# Patient Record
Sex: Female | Born: 1965 | Race: White | Hispanic: No | Marital: Married | Smoking: Never smoker
Health system: Southern US, Community
[De-identification: ages and names within clinical notes are randomized; demographics above are authoritative.]

## PROBLEM LIST (undated history)

## (undated) DIAGNOSIS — A419 Sepsis, unspecified organism: Secondary | ICD-10-CM

## (undated) DIAGNOSIS — I456 Pre-excitation syndrome: Secondary | ICD-10-CM

## (undated) DIAGNOSIS — I4891 Unspecified atrial fibrillation: Secondary | ICD-10-CM

## (undated) HISTORY — PX: KNEE SURGERY: SHX244

## (undated) HISTORY — PX: ABDOMINAL HYSTERECTOMY: SHX81

---

## 2000-11-02 ENCOUNTER — Emergency Department (HOSPITAL_COMMUNITY): Admission: EM | Admit: 2000-11-02 | Discharge: 2000-11-02 | Payer: Self-pay | Admitting: Emergency Medicine

## 2000-12-02 ENCOUNTER — Emergency Department (HOSPITAL_COMMUNITY): Admission: EM | Admit: 2000-12-02 | Discharge: 2000-12-02 | Payer: Self-pay | Admitting: Emergency Medicine

## 2000-12-02 ENCOUNTER — Encounter: Payer: Self-pay | Admitting: *Deleted

## 2000-12-02 ENCOUNTER — Encounter: Payer: Self-pay | Admitting: Emergency Medicine

## 2003-02-21 ENCOUNTER — Encounter: Payer: Self-pay | Admitting: Emergency Medicine

## 2003-02-21 ENCOUNTER — Emergency Department (HOSPITAL_COMMUNITY): Admission: EM | Admit: 2003-02-21 | Discharge: 2003-02-21 | Payer: Self-pay | Admitting: Emergency Medicine

## 2006-04-23 ENCOUNTER — Other Ambulatory Visit: Admission: RE | Admit: 2006-04-23 | Discharge: 2006-04-23 | Payer: Self-pay | Admitting: Obstetrics and Gynecology

## 2006-09-08 ENCOUNTER — Ambulatory Visit (HOSPITAL_COMMUNITY): Admission: RE | Admit: 2006-09-08 | Discharge: 2006-09-09 | Payer: Self-pay | Admitting: Obstetrics and Gynecology

## 2006-09-08 ENCOUNTER — Encounter (INDEPENDENT_AMBULATORY_CARE_PROVIDER_SITE_OTHER): Payer: Self-pay | Admitting: Specialist

## 2006-09-17 ENCOUNTER — Inpatient Hospital Stay (HOSPITAL_COMMUNITY): Admission: AD | Admit: 2006-09-17 | Discharge: 2006-09-26 | Payer: Self-pay | Admitting: Obstetrics and Gynecology

## 2006-09-20 ENCOUNTER — Encounter (INDEPENDENT_AMBULATORY_CARE_PROVIDER_SITE_OTHER): Payer: Self-pay | Admitting: *Deleted

## 2006-09-22 ENCOUNTER — Encounter: Payer: Self-pay | Admitting: Vascular Surgery

## 2006-09-22 ENCOUNTER — Ambulatory Visit: Payer: Self-pay | Admitting: Vascular Surgery

## 2006-09-22 ENCOUNTER — Encounter: Payer: Self-pay | Admitting: Pulmonary Disease

## 2006-09-23 ENCOUNTER — Ambulatory Visit: Payer: Self-pay | Admitting: Internal Medicine

## 2007-01-12 ENCOUNTER — Emergency Department (HOSPITAL_COMMUNITY): Admission: EM | Admit: 2007-01-12 | Discharge: 2007-01-12 | Payer: Self-pay | Admitting: Emergency Medicine

## 2010-06-27 ENCOUNTER — Emergency Department (HOSPITAL_BASED_OUTPATIENT_CLINIC_OR_DEPARTMENT_OTHER)
Admission: EM | Admit: 2010-06-27 | Discharge: 2010-06-27 | Payer: Self-pay | Source: Home / Self Care | Admitting: Emergency Medicine

## 2010-08-20 ENCOUNTER — Emergency Department (HOSPITAL_COMMUNITY)
Admission: EM | Admit: 2010-08-20 | Discharge: 2010-08-20 | Disposition: A | Discharge status: Home / Self Care | Payer: Self-pay | Attending: Emergency Medicine | Admitting: Emergency Medicine

## 2010-08-20 DIAGNOSIS — M7989 Other specified soft tissue disorders: Secondary | ICD-10-CM | POA: Insufficient documentation

## 2010-08-20 DIAGNOSIS — M79609 Pain in unspecified limb: Secondary | ICD-10-CM | POA: Insufficient documentation

## 2010-08-20 LAB — POCT I-STAT, CHEM 8
Calcium, Ion: 1.17 mmol/L (ref 1.12–1.32)
Creatinine, Ser: 0.7 mg/dL (ref 0.4–1.2)
Potassium: 3.9 mEq/L (ref 3.5–5.1)

## 2010-08-20 LAB — DIFFERENTIAL
Basophils Relative: 1 % (ref 0–1)
Eosinophils Absolute: 0.3 10*3/uL (ref 0.0–0.7)
Monocytes Absolute: 0.6 10*3/uL (ref 0.1–1.0)
Monocytes Relative: 7 % (ref 3–12)

## 2010-08-20 LAB — D-DIMER, QUANTITATIVE: D-Dimer, Quant: 0.46 ug/mL-FEU (ref 0.00–0.48)

## 2010-08-20 LAB — CBC
Hemoglobin: 13.5 g/dL (ref 12.0–15.0)
MCV: 86.2 fL (ref 78.0–100.0)
RBC: 4.56 MIL/uL (ref 3.87–5.11)
RDW: 13.5 % (ref 11.5–15.5)

## 2010-08-21 ENCOUNTER — Ambulatory Visit (HOSPITAL_COMMUNITY): Payer: Self-pay

## 2010-09-08 ENCOUNTER — Emergency Department (HOSPITAL_COMMUNITY)
Admission: EM | Admit: 2010-09-08 | Discharge: 2010-09-08 | Disposition: A | Discharge status: Home / Self Care | Payer: Self-pay | Attending: Emergency Medicine | Admitting: Emergency Medicine

## 2010-09-08 ENCOUNTER — Emergency Department (HOSPITAL_COMMUNITY): Payer: Self-pay

## 2010-09-08 DIAGNOSIS — M25569 Pain in unspecified knee: Secondary | ICD-10-CM | POA: Insufficient documentation

## 2010-09-10 ENCOUNTER — Other Ambulatory Visit (HOSPITAL_COMMUNITY): Payer: Self-pay | Admitting: Sports Medicine

## 2010-09-10 DIAGNOSIS — S83289A Other tear of lateral meniscus, current injury, unspecified knee, initial encounter: Secondary | ICD-10-CM

## 2010-09-14 ENCOUNTER — Ambulatory Visit (HOSPITAL_COMMUNITY)
Admission: RE | Admit: 2010-09-14 | Discharge: 2010-09-14 | Disposition: A | Discharge status: Home / Self Care | Payer: Self-pay | Source: Ambulatory Visit | Attending: Sports Medicine | Admitting: Sports Medicine

## 2010-09-14 DIAGNOSIS — IMO0002 Reserved for concepts with insufficient information to code with codable children: Secondary | ICD-10-CM | POA: Insufficient documentation

## 2010-09-14 DIAGNOSIS — S83289A Other tear of lateral meniscus, current injury, unspecified knee, initial encounter: Secondary | ICD-10-CM

## 2010-09-14 DIAGNOSIS — M7120 Synovial cyst of popliteal space [Baker], unspecified knee: Secondary | ICD-10-CM | POA: Insufficient documentation

## 2010-09-14 DIAGNOSIS — W19XXXA Unspecified fall, initial encounter: Secondary | ICD-10-CM | POA: Insufficient documentation

## 2010-09-14 DIAGNOSIS — M2240 Chondromalacia patellae, unspecified knee: Secondary | ICD-10-CM | POA: Insufficient documentation

## 2010-09-28 ENCOUNTER — Ambulatory Visit (HOSPITAL_BASED_OUTPATIENT_CLINIC_OR_DEPARTMENT_OTHER)
Admission: RE | Admit: 2010-09-28 | Discharge: 2010-09-28 | Disposition: A | Discharge status: Home / Self Care | Payer: Self-pay | Source: Ambulatory Visit | Attending: Orthopedic Surgery | Admitting: Orthopedic Surgery

## 2010-09-28 DIAGNOSIS — E669 Obesity, unspecified: Secondary | ICD-10-CM | POA: Insufficient documentation

## 2010-09-28 DIAGNOSIS — Z01812 Encounter for preprocedural laboratory examination: Secondary | ICD-10-CM | POA: Insufficient documentation

## 2010-09-28 DIAGNOSIS — M23329 Other meniscus derangements, posterior horn of medial meniscus, unspecified knee: Secondary | ICD-10-CM | POA: Insufficient documentation

## 2010-09-28 LAB — POCT HEMOGLOBIN-HEMACUE: Hemoglobin: 13.8 g/dL (ref 12.0–15.0)

## 2010-10-04 NOTE — Op Note (Signed)
  NAMENERIA, PROCTER          ACCOUNT NO.:  [id number]  MEDICAL RECORD NO.:  [id number]          PATIENT TYPE:  LOCATION:                                 FACILITY:  PHYSICIAN:  Eulas Post, MD    DATE OF BIRTH:  Dec 20, 1965  DATE OF PROCEDURE:  09/28/2010 DATE OF DISCHARGE:                              OPERATIVE REPORT   PREOPERATIVE DIAGNOSIS:  Left medial meniscus tear.  POSTOPERATIVE DIAGNOSIS:  Left medial meniscus tear.  OPERATIVE PROCEDURE:  Left knee arthroscopy with partial medial meniscectomy.  ATTENDING SURGEON:  Eulas Post, MD  ASSISTANT:  Janace Litten, OPA-C  PREOPERATIVE INDICATIONS:  Ms. Chrisma Hurlock is a 45 year old woman who complained of severe left knee pain.  She failed conservative measures and elected for knee arthroscopy.  She had a posterior horn medial meniscus tear found on MRI.  The risks, benefits, and alternatives were discussed with her before the procedure including but not limited to risks of infection, bleeding, nerve injury, progression of arthritis, recurrent meniscal tear, incomplete relief of pain, cardiopulmonary complications, among others, and she is willing to proceed.  OPERATIVE FINDINGS:  The patellofemoral joint was relatively normal, although there was some cracking in the undersurface of the patella, but overall grade 2 changes at most.  The lateral compartment was completely normal.  The ACL and PCL were normal.  The medial compartment had some fairly diffuse grade 2 changes on the femoral condyle with grade 1 changes on the tibia.  The posterior horn medial meniscus had a radial tear that extended almost completely to the capsular junction.  The body and anterior horn were intact.  OPERATIVE PROCEDURE:  The patient was brought to the operating room, placed in supine position.  General anesthesia administered.  The left lower extremity was prepped and draped in the usual sterile fashion. Diagnostic  arthroscopy was carried out with the above-named findings.  I used an arthroscopic shaver and an arthroscopic biter to debride the posterior horn radial tear back to a stable configuration.  This went almost all the way back to the capsule.  This had been debrided, both at the root as well as at the posterior portion of the body.  After this was debrided, the arthroscopic instruments were removed and all loose debris removed, and the portals repaired with Monocryl and Steri-Strips and sterile gauze.  The patient was awakened and extubated and returned to the PACU in stable and satisfactory condition.  There were no complications, and she tolerated the procedure well.     Eulas Post, MD     JPL/MEDQ  D:  09/28/2010  T:  09/29/2010  Job:  [id number]  Electronically Signed by Teryl Lucy MD on 10/04/2010 11:28:44 AM

## 2010-11-16 NOTE — Op Note (Signed)
[name], [name]          ACCOUNT NO.:  [id number]   MEDICAL RECORD NO.:  [id number]          PATIENT TYPE:  INP   LOCATION:  9303                          FACILITY:  [facility]   PHYSICIAN:  Hal Morales, M.D.DATE OF BIRTH:  1966-03-10   DATE OF PROCEDURE:  09/20/2006  DATE OF DISCHARGE:                               OPERATIVE REPORT   PREOPERATIVE DIAGNOSES:  Pelvic abscess, probably at least two abscesses  with loculation.   POSTOPERATIVE DIAGNOSIS:  Pelvic abscess.  Right adnexal abscess.  Numerous adhesions between loops of bowel and between bowel and pelvic  sidewall and between bowel and abscess.   OPERATION:  Diagnostic laparoscopy, laparotomy, intraoperative  consultation for lysis of adhesions, drainage of pelvic abscess, and  appendectomy.   SURGEON:  Hal Morales, M.D. for laparoscopy, for initiation of  laparotomy.   FIRST ASSISTANT:  Janine Limbo, M.D.   Intraoperative consultant and surgeon for completion of the laparotomy,  appendectomy, and lysis of adhesions with drainage of pelvic abscess was  [facility]. Derrell Lolling, M.D.   ANESTHESIA:  General orotracheal.   ESTIMATED BLOOD LOSS FOR TOTAL PROCEDURE:  \  150 mL.   COMPLICATIONS:  No immediate intraoperative complications noted.   FINDINGS:  There were numerous bowel adhesions with small bowel  dilatation.  At the time of laparoscopy, it was noted that the bowel was  adherent in loops and obscured the pelvic abscess which had been noted  on CT scan.  At the time of laparotomy, the bowel adhesions were noted  to be significant enough that even with the ability to digitally move  the bowel, there was significant risk of bowel damage.  It was at that  time that the decision was made to obtain intraoperative consultation  from general surgery.   PROCEDURE:  The patient was taken to the operating room after  appropriate identification and after a discussion of the indications for  her  procedure, which included a pelvic abscess which was not responding  clinically to antibiotics, and the risks involved of anesthesia,  bleeding, infection, damage to adjacent organs.  A similar conversation  had been held with the patient prior to her hysterectomy approximately  12 days ago, and she acknowledged recollection of that.  She was placed  on the operating table, and after attainment of adequate general  anesthesia, she was placed in the modified lithotomy position.  The  abdomen, perineum, and vagina were prepped with multiple layers of  Betadine.  A Foley catheter was inserted into the bladder and connected  to straight drainage.  A sponge stick with two sponges was placed in the  vagina for elevation of the vaginal cuff.  The abdomen was draped as a  sterile field.  Subumbilical and suprapubic infiltration of the skin was undertaken for  a total of 10 mL of 0.25% Marcaine.   A subumbilical incision was made and the abdomen opened through that 3-  cm incision to allow identification of the fascia.  The fascia was  opened sharply and the peritoneum identified.  The peritoneum was opened  sharply and sutures used to mark the  peritoneum and fascia and the  sutures intended to be used for postoperative closure.  The Hassan  cannula was inserted into the peritoneal cavity and a pneumoperitoneum  created with approximately 5 liters of CO2.  Suprapubic incisions were  made to the right and left of midline and laparoscopic probe trocars  placed through the incision under direct visualization after the  laparoscope had been placed through the subumbilical trocar sleeve.  The  above-noted findings were made and a decision made that drainage  laparoscopically was not going to be in the patient's best interest.  At  that time, all instruments were removed from the peritoneal cavity under  direct visualization as the CO2 was allowed to escape.  Prior to  removal, copious irrigation had  been carried out in an effort to better  visualize the area of the pelvic abscess but to no avail.  The  subumbilical incision was closed with the 0 Vicryl sutures that had been  used to mark the fascia in an interrupted fashion.  A subcuticular  suture of 3-0 Vicryl was then used to close the skin incision.   A transverse incision was made in the abdomen in hopes of being able to  readily access the pelvic abscess and drain it through a small incision.  The abdomen was opened in layers and the peritoneum entered.  The bowel  was again noted to be markedly adherent,  loops one to another, all of  small bowel.  No defect in the integrity of the bowel was noted, but the  ability to mobilize the bowel for access to the pelvic abscess was  markedly limited by further adhesions.  At this time, it was decided to  obtain intraoperative consultation, and Dr. Claud Kelp was called  for intraoperative consultation.  He reviewed the abdominal pelvic CT  scan which had been done earlier in the day and presented for  intraoperative consultation concerning maintaining integrity of the  bowel and accomplishing drainage of the pelvic and adnexal abscesses.  At this time, the primary surgeon became Dr. Derrell Lolling, and I continued  with first assisting in the completion of the procedure which was  intraoperative consultation, drainage of pelvic abscess, and  appendectomy.  The remainder of that procedure is dictated under a  separate operative report.      Hal Morales, M.D.  Electronically Signed     VPH/MEDQ  D:  09/20/2006  T:  09/21/2006  Job:  [id number]

## 2010-11-16 NOTE — Discharge Summary (Signed)
NAMEAMALIA, [name]          ACCOUNT NO.:  [id number]   MEDICAL RECORD NO.:  [id number]          PATIENT TYPE:  OIB   LOCATION:  9319                          FACILITY:  [facility]   PHYSICIAN:  Hal Morales, M.D.DATE OF BIRTH:  Aug 13, 1965   DATE OF ADMISSION:  09/08/2006  DATE OF DISCHARGE:  09/09/2006                               DISCHARGE SUMMARY   DISCHARGE DIAGNOSES:  1. Fibroid uterus.  2. Menorrhagia.   OPERATION:  On the day of admission, the patient underwent a total  vaginal hysterectomy, tolerating procedure well.  The patient was found  to have a uterus that was enlarged to approximately 10 weeks' size and  weighed 247 grams.  The ovaries were within normal limits, and the tubes  were status post tubal ligation for surgical sterilization.   HISTORY OF PRESENT ILLNESS:  [name] is a 45 year old, divorced,  white female para 2-0-0-1, who presents for definitive therapy because  of symptomatic uterine fibroids and menorrhagia.  Please see the  patient's dictated history and physical examination for details.   PREOPERATIVE PHYSICAL EXAMINATION:  VITAL SIGNS:  Blood pressure 118/80,  temperature 98 degrees.  GENERAL:  Within normal limits.  PELVIC:  EG/BUS within normal limits.  Vagina is rugous.  The cervix had  several Nabothian cysts.  The uterus is upper limits of normal size,  posterior, with slightly decreased mobility.  The adnexa showed no  palpable masses.  Rectovaginal exam confirms.   HOSPITAL COURSE:  On the day of admission, the patient underwent  aforementioned procedure, tolerating it well.  Postoperative course was  unremarkable with the patient tolerating a post-op hemoglobin of 10.9  (preoperative hemoglobin 12.9).  By post-op day #1, the patient had  resumed bowel and bladder function and was therefore deemed ready for  discharge home.   DISCHARGE MEDICATIONS:  1. Ibuprofen 600 mg every 6 hours for 3 days, then as needed for pain.  2.  Percocet one or two tablets every 4 hours as needed for pain.  3. Colace 100 mg up to twice daily as needed for constipation.   FOLLOWUP:  The patient is scheduled for a 6 weeks postoperative visit  with Dr. Pennie Rushing.  She is to call [phone] to schedule that appointment.   DISCHARGE INSTRUCTIONS:  The patient was given a copy of [facility] postoperative instruction sheet.  She was further  advised to avoid driving for 2 weeks, heavy lifting for 6 weeks,  intercourse for 6 weeks, that she may shower and bathe, may walk up  steps, should increase her activities slowly, and her diet is without  restriction.   FINAL PATHOLOGY:  Uterus, hysterectomy:  Uterine cervix - benign  ectocervical and endocervical mucosa with chronic endocervicitis and  squamous metaplasia.  Uterine corpus - benign proliferative endometrium  with intramural leiomyomata (three in number).      [name].      Hal Morales, M.D.  Electronically Signed    EJP/MEDQ  D:  10/04/2006  T:  10/04/2006  Job:  [id number]

## 2010-11-16 NOTE — Op Note (Signed)
[name], [name]          ACCOUNT NO.:  [id number]   MEDICAL RECORD NO.:  [id number]          PATIENT TYPE:  INP   LOCATION:  1228                         FACILITY:  [facility]   PHYSICIAN:  Angelia Mould. Derrell Lolling, M.D.DATE OF BIRTH:  07-Apr-1966   DATE OF PROCEDURE:  09/20/2006  DATE OF DISCHARGE:                               OPERATIVE REPORT   REASON FOR CONSULTATION:  Intraoperative consultation and management of  pelvic abscess.   PREOPERATIVE DIAGNOSIS:  Pelvic abscess and right lower quadrant  abscess.   POSTOPERATIVE DIAGNOSIS:  Pelvic abscess and right lower quadrant  abscess with involvement of appendix.   PROCEDURE:  1. Intraoperative consultation.  2. Drainage of pelvic abscess.  3. Appendectomy.   SURGEON:  Angelia Mould. Derrell Lolling, M.D.   FIRST ASSISTANT:  Hal Morales, M.D.   HISTORY:  This is a 45 year old single white female who underwent total  vaginal hysterectomy on September 08, 2006 with uneventful findings.  She  did well for a week but came back with abdominal pain, nausea, and  vomiting and was readmitted to the hospital by Dr. Pennie Rushing on September 17, 2006.  She has been constipated.  Denied vaginal bleeding.  Had low-  grade fever.   PAST MEDICAL HISTORY:  1. Gastroenteritis two months ago.  2. History of migraines.  3. One episode of bronchospasm.   PAST SURGICAL HISTORY:  1. Tubal ligation in 1988.  2. Umbilical hernia repair.   ALLERGIES:  PENICILLIN AND CEPHALOSPORINS.   PHYSICAL EXAMINATION:  I was unable to examine the patient  preoperatively as I was consulted intraoperatively.   OPERATIVE FINDINGS:  The patient had an abscess in the right lower  quadrant involving the right adnexa.  This was thick, yellow, creamy pus  which was cultured.  There was a larger 8-cm deep pelvic abscess in the  midline which was very turbid and infected but was more watery in  nature.  There was no evidence of any bowel obstruction.  There was no  evidence of  any GI perforation or content.  The appendix appeared  severely secondarily inflamed with a right adnexal abscess.  For that  reason, I felt that it was at risk and should be removed.  I could  identify the terminal ileum and the cecum, and they looked normal.  The  omentum and transverse colon looked normal.  There was some cloudy  watery fluid in the upper abdomen from the irrigation fluid but no  abscess in the upper abdomen and no inflammatory process in the upper  abdomen on either side.   OPERATIVE TECHNIQUE:  By Dr. Dierdre Forth had performed diagnostic  laparoscopy and had performed a Pfannenstiel incision and then called me  to the operating room when it became apparent that the inflammatory  reaction was rather dense, and she was concerned for injury to the  bowel.  I reviewed the CT scan which showed an 8-cm unilocular abscess  deep within the pelvis in the cul-de-sac anterior to the rectum and also  what appeared to be a smaller bilobed abscess in the right adnexal  region.  By CT scan,  there was no disease process in the upper abdomen  or subphrenic spaces.   I scrubbed in.  I felt that the Pfannenstiel incision was inadequate for  exposure.  I made a midline incision from just below the umbilicus down  into the center of the Pfannenstiel incision.  I divided the  subcutaneous tissue and the midline fascia and then divided the rest of  the peritoneum.  At that point we could place a self-retaining  retractor.   I explored the entire abdomen and pelvis.  I entered an abscess in the  right lower quadrant below the cecum, and there was tan creamy pus, and  that was cultured.  I mobilized the appendix and inspected it and felt  that we would have to take that out.  I then slowly dissected some of  the small bowel loops away from one another.  They were densely adherent  to the bladder, but I found a space between bowel loops and I got down  into the true pelvis and drained  the abscess.  I irrigated this out  quite extensively.  I washed the fluid out and felt that this had been  completely adequately drain.  I felt that further dissection of the  small bowel and colon would put them at risk for perforation, and so I  decided just to drain the areas.   I divided the mesentery of the appendix between clamps and ligated it  with 2-0 silk ties.  I placed a pursestring suture of 2-0 silk in the  cecum around the base of the appendix.  I put a 2-0 Vicryl tie around  the base of the appendix and then amputated the appendix and removed it.  I inverted the appendix and then tied the pursestring.  This provided a  very secure closure, and no further sutures were necessary.   We spent some time irrigating the pelvis, the abdomen, and the  subphrenic spaces, getting all of the fluid until it was completely  clear and irrigated out.  I placed a 10-French Jackson-Pratt drain down  into the pelvic abscess and then a second 10-French drain into the right  lower quadrant/right adnexal region.  These were brought out through  separate stab incisions in the right lower quadrant and sutured to the  skin with nylon sutures and connected to suction bulbs.  We returned the  omentum to its anatomic position.  The peritoneum was closed with a  running suture of 2-0 Vicryl.  I reconstructed all of the abdominal wall  fascia, first bringing the anterior rectus sheath together in the  midline inferiorly.  I then closed the Pfannenstiel incision by closing  the anterior rectus fascia and the internal and external oblique with  running sutures of #1 PDS.  Separate sutures were placed on the right  and on the left.  This completely reconstructed the Pfannenstiel  incision.  The midline fascia was then closed with a running suture of  #1 PDS and about four or five interrupted sutures of #1 Novofil.  The skin was closed with skin staples, leaving several gaps through which I  placed  Telfa wicks.  Clean bandages were placed and the patient taken to  the recovery room in stable condition.   ESTIMATED BLOOD LOSS:  Probably around 250 mL.   COMPLICATIONS:  None.   COUNTS:  Sponge, needle, and instrument counts were correct.      Angelia Mould. Derrell Lolling, M.D.  Electronically Signed  HMI/MEDQ  D:  09/20/2006  T:  09/21/2006  Job:  [id number]   cc:   Hal Morales, M.D.  Fax: [phone]

## 2010-11-16 NOTE — Discharge Summary (Signed)
[name], [name]          ACCOUNT NO.:  [id number]   MEDICAL RECORD NO.:  [id number]          PATIENT TYPE:  INP   LOCATION:  1416                         [facility]ACILITY[facility]   PHYSICIAN:  Hal Morales, M.D.DATE OF BIRTH:  [birth year]   DATE OF ADMISSION:  09/17/2006  DATE OF DISCHARGE:  09/26/2006                               DISCHARGE SUMMARY   DISCHARGE DIAGNOSES:  1. Abdominal pain.  2. Pelvic abscess.  3. Atrial fibrillation.  4. Status post total vaginal hysterectomy (September 08, 2006).   PROCEDURE:  On hospital day #3, the patient underwent a diagnostic  laparoscopy followed by a laparotomy with intraoperative consultation  from Dr. Derrell Lolling of general surgery for lysis of adhesions, drainage of  pelvic abscesses and an appendectomy.  The patient tolerated the  procedure well.  The patient was found to have an abscess in the right  lower quadrant involving her right adnexa.  Additionally, there was a  larger, 8 cm deep pelvic abscess in the midline of her pelvis.  There  was no evidence of any bowel obstruction nor was there any evidence of  GI perforation or content.  The patient's appendix appeared severely  secondarily inflamed with the right adnexal abscess.   HISTORY OF PRESENT ILLNESS:  [name] is a 45 year old, single, white  female who is status post total vaginal hysterectomy (September 08, 2006)  and presented to [facility] [place] complaining of severe  abdominal pain, nausea and vomiting.  The patient was admitted for pain  management and further evaluation of her symptoms.  Please see the  patient's dictated history and physical examination for details.   PHYSICAL EXAMINATION:  VITAL SIGNS:  Temperature 99.6 degrees Fahrenheit  orally, however, during her exam it increased to 102.5 degrees  Fahrenheit orally.  Blood pressure 125/77, pulse 107, respirations 20.  GENERAL:  Within normal limits.  ABDOMEN:  Tender to palpation.  There was no  rebound or guarding.  PELVIC:  Deferred.   LABORATORY DATA AND X-RAY FINDINGS:  Admission CBC with differential  revealed a white count of 15.6, hemoglobin 12.5, platelets 23,000,  neutrophils 89, granulocytes 13.9, lymphocytes 5.  The patient's  comprehensive metabolic panel was within normal limits other than the  fact of the patient's potassium being 2.8.   CT scan of the abdomen and pelvis showed no small bowel obstruction.  There was, however, a fluid collection in the cul-de-sac which was felt  to be postoperative fluid or hematoma, although an abscess could not be  excluded.   HOSPITAL COURSE:  On the date of admission, the patient was placed on  appropriate antibiotic therapy and managed for pain, nausea and  vomiting.  The patient gradually improved clinically, however, her  temperature remained elevated and over time her abdominal pain returned.  A repeat CTscan showed a definite pelvic abscess. Consequently, the  patient underwent aforementioned surgical procedures.  By postop day #1,  the patient reported a decrease in her abdominal pain.  At that time,  her white count was 11.8 with neutrophils being 87%, platelets 203,  hemoglobin 9.3, hematocrit 27.8 and her potassium had increased  to 3.1.  On the early morning of postop day #2, the patient went into atrial  fibrillation which was converted to sinus rhythm with amiodarone.  The  patient's cardiac enzymes were negative and her evaluation for  thromboembolic events were negative for DVT and pulmonary embolism. I  was thus felt that with normalization of her potassium, the patient  should require no further antiarhythmia therapy. The cultures from  patient's pelvic abscesses were negative at this time with a Gram stain  showing gram-positive cocci in pairs.  The patient's HIV test was  nonreactive.  Due to the patient's persistent fever, it was decided to  change her antibiotics to Tygacil and Azactam for full antibiotic   coverage.  An infectious disease consult was also obtained and they  concurred with the new choice in antibiotic therapy.  From this point,  the patient began to improve once again and by postop day #4 resumed  bowel function, had normalized her temperature, her potassium increased  to 3.6, white blood count was 9.2, hemoglobin 8.9, hematocrit 25.8,  platelets 325.  This trend continued until the patient's discharge on  hospital day #9, at which time it was believed that she had received the  maximum benefit of her hospital stay.   DISCHARGE LABORATORY DATA AND X-RAY FINDINGS:  Discharge CBC with WBC  11.7, hemoglobin 10.2, hematocrit 29.6, platelets 518.  Basic metabolic  panel with sodium 137, potassium 3.5, chloride 102, CO2 28, glucose 115,  BUN 3, creatinine 0.48.   DISCHARGE MEDICATIONS:  1. Avelox 400 mg daily through October 11, 2006.  2. Flagyl 500 mg four times a day through April 17.  3. Vicodin one to two tablets every 4-6 hours as needed for pain.  4. Iron sulfate 300 mg daily.   FOLLOW UP:  The patient is to call Dr. Jacinto Halim office to schedule a  followup visit in 3-4 weeks at [phone].  She is scheduled for a  followup visit with Dr. Pennie Rushing on October 01, 2006, at 4 p.m.   WOUND CARE:  The patient was advised to keep her incision and its  bandage dry and to change after each shower.   ACTIVITY:  Avoid driving x1 week.  No heavy lifting x4 weeks.  She may  walk up steps.  She should use assistance when she walks.  She may  shower.   DIET:  Low-sodium, heart-healthy diet.      [name].      Hal Morales, M.D.  Electronically Signed    EJP/MEDQ  D:  11/10/2006  T:  11/11/2006  Job:  [id number]

## 2010-11-16 NOTE — Op Note (Signed)
[name], [name]          ACCOUNT NO.:  [id number]   MEDICAL RECORD NO.:  [id number]          PATIENT TYPE:  AMB   LOCATION:  SDC                           F[facility]ILITY:  [facility]   PHYSICIAN:  Hal Morales, M.D.DATE OF BIRTH:  September 05, 1965   DATE OF PROCEDURE:  09/08/2006  DATE OF DISCHARGE:                               OPERATIVE REPORT   PREOPERATIVE DIAGNOSES:  Menorrhagia, uterine fibroids.   POSTOPERATIVE DIAGNOSES:  Menorrhagia, uterine fibroids.   OPERATION:  Total vaginal hysterectomy.   SURGEON:  Hal Morales, M.D.   FIRST ASSISTANT:  Crist Fat. Rivard, M.D.   ANESTHESIA:  General orotracheal.   ESTIMATED BLOOD LOSS:  100 mL.   COMPLICATIONS:  None.   FINDINGS:  The uterus was enlarged to approximately 10-week size and  weighed 247 grams.  The ovaries were within normal limits.  The tubes  were status post tubal ligation for surgical sterilization.   PROCEDURE:  The patient was taken to the operating room after  appropriate identification and placed on the operating table.  After the  attainment of adequate general anesthesia she was placed in lithotomy  position.  The abdomen, perineum and vagina were prepped with multiple  layers of Betadine and a Foley catheter inserted into the bladder and  connected to straight drainage.  The perineum was draped as a sterile  field.  A weighted speculum was placed in the posterior vagina and Lahey  tenaculum placed on the anterior and posterior surfaces of the cervix.  The cervicovaginal mucosa was injected with a dilute solution of  Pitressin for total of 40 mL.  The cervicovaginal mucosa was  circumscribed and the anterior vaginal mucosa bluntly dissected off the  anterior cervix.  The posterior peritoneum was entered sharply and  tagged.  Uterosacral ligaments on the right and left side were clamped,  cut and suture ligated and those sutures held.  The paracervical tissues  were then clamped, cut and suture  ligated on either side.  The anterior  peritoneum was entered and the bladder blade placed.  The parametrial  tissues, uterine arteries, were successively clamped, cut and suture  ligated.  The uterus was inverted and the upper pedicles clamped, cut,  suture ligated and tied with free ties to allow for adequate hemostasis.  The uterus was removed from the operative field.  Hemostatic sutures  were placed in the vaginal cuff at the area of the uterosacral ligaments  on the left and the McCall culdoplasty suture placed in the posterior  cul-de-sac incorporating the two uterosacral ligaments and the  intervening posterior peritoneum.  The vaginal angles were created with  the sutures that had been held from the uterosacral ligaments and tied  down.  The remainder of the vaginal cuff was closed with interrupted  sutures of 0 Vicryl in a figure-of-eight fashion incorporating the  anterior vaginal mucosa, anterior peritoneum, posterior peritoneum, and  posterior vaginal mucosa in each stitch.  Hemostasis was noted to be  adequate and the [facility] culdoplasty suture was tied down.  The vagina  was packed with 2-inch vaginal packing that had been moistened  with Estrace  cream.  The patient was awakened from general anesthesia  and taken to the recovery room in satisfactory condition having  tolerated the procedure well with sponge and instrument counts correct.   SPECIMENS TO PATHOLOGY:  Uterus and cervix.      Hal Morales, M.D.  Electronically Signed     VPH/MEDQ  D:  09/08/2006  T:  09/08/2006  Job:  [id number]

## 2010-11-16 NOTE — H&P (Signed)
NAMEJEWEL, MCAFEE          ACCOUNT NO.:  [id number]   MEDICAL RECORD NO.:  [id number]          PATIENT TYPE:  INP   LOCATION:  9303                          [facility]ACILITY:  [facility]   PHYSICIAN:  Naima A. Dillard, M.D. DATE OF BIRTH:  March 09, 1966   DATE OF ADMISSION:  09/17/2006  DATE OF DISCHARGE:                              HISTORY & PHYSICAL   [name] is a 45 year old single white female status post TVH on  September 08, 2006 who presents with abdominal pain and nausea and vomiting.  Onset of nausea and vomiting began last p.m. followed by the abdominal  pain. She was unable to keep pain medications down. Denied dysuria. Has  been constipated since the surgery but was able to have a bowel movement  this past Friday as well as today. She denies any more vaginal bleeding.  Temperature was 100 yesterday at home. Up until the vomiting began, her  recovery has proceeded normally. The TVH  was performed secondary to  menorrhagia and uterine fibroid. Dr. Dierdre Forth was the surgeon.  Her hospital course was uncomplicated, and she went home on the morning  of September 09, 2006.   Her obstetrical history:  The patient had 2 normal spontaneous vaginal  deliveries.   PAST MEDICAL HISTORY:  1. Positive for gastroenteritis at the end of February which resolved      after 24 hours.  2. History of migraines but has not had one for approximately one      year.  3. In 2004, she had a single episode of bronchospasm which was treated      by a beta agonist inhaler, but she has had no other difficulty.   PAST SURGICAL HISTORY:  1. Tubal ligation in 1988.  2. Umbilical hernia repair as a child.   DRUG SENSITIVITIES:  PENICILLIN CAUSES THROAT SWELLING AS WELL AS  CEPHALOSPORINS.   SOCIAL HISTORY:  The patient works full time as a Librarian, academic.   OBJECTIVE DATA:  VITAL SIGNS:  Temperature 99.6 and 102.5. Blood  pressure 125/77, pulse 107, respirations 20.  HEENT:  Grossly within  normal limits.  CHEST:  Clear to auscultation.  HEART:  Regular rate and rhythm.  ABDOMEN:  Tender to palpation. No rebound. No guarding.  PELVIC:  Deferred.  EXTREMITIES:  Within normal limits.   CBC with differential:  White blood cell count 15.6, hemoglobin 12.5,  platelets 273,000, neutrophils 89, granulocytes 13.9, lymphocytes 5.  Clean-catch urinalysis:  Specific gravity greater than 1.030, moderate  hemoglobin, 40 of ketones, and 30 of protein. Many squamous epithelials.  CMET is within normal limits other than potassium at 2.8. CT scan of the  abdomen and pelvis shows no small-bowel obstruction, fluid collection in  the cul-de-sac may be postoperative fluid or hematoma - cannot exclude  abscess.   ASSESSMENT:  1. Status post total vaginal hysteroscopy 10 days ago.  2. Abdominal pain.  3. Postoperative infection.   PLAN:  Is to admit to Women's unit. Begin on Levaquin and Flagyl.  Culture blood and urine.      Cam Hai, C.N.M.      Naima A. Dillard,  M.D.  Electronically Signed    KS/MEDQ  D:  09/18/2006  T:  09/18/2006  Job:  [id number]

## 2010-11-16 NOTE — H&P (Signed)
[name], [name]          ACCOUNT NO.:  [id number]   MEDICAL RECORD NO.:  [id number]          PATIENT TYPE:  AMB   LOCATION:  SDC                           [facility]  [facility]   PHYSICIAN:  Hal Morales, M.D.DATE OF BIRTH:  1966-03-08   DATE OF ADMISSION:  09/08/2006  DATE OF DISCHARGE:                              HISTORY & PHYSICAL   PREADMISSION HISTORY AND PHYSICAL   HISTORY OF PRESENT ILLNESS:  The patient is a 45 year old, white,  divorced female, para 2-0-0-1, who presents for definitive therapy of  menorrhagia.  The patient has had difficulty with heavy periods and  frequent periods since June of 2007.  Prior to that time, she had  monthly menses that lasted for four days.  The patient complains of  temperature intolerance and increased weight in spite of daily exercise  but had normal thyroid studies.  A pelvic ultrasound was done, which  showed a 5 cm left lateral fibroid with a questionable submucosal  component.  An endometrial biopsy showed benign secretory endometrium  with no hyperplasia or malignancy identified.  The patient discussed  with me the options for management of her menorrhagia and decided to  undergo definitive therapy with hysterectomy.   OBSTETRICAL HISTORY:  The patient had two normal spontaneous vaginal  deliveries with the largest child weighing 9 pounds 15 ounces.  Her 18-  year-old son is alive and well.  Her daughter died at 13-1/2 months  secondary to heart disease.   PAST MEDICAL HISTORY:  1. Positive for gastroenteritis, August 28, 2006, which resolved      after 24 hours.  2. History of migraines but has not had one for approximately one      year.  3. In 2004, she had a single episode of bronchospasm, which was      treated by a beta agonist inhaler, but she has had no other      difficulty.   PAST SURGICAL HISTORY:  1. Tubal ligation in 1988.  2. Umbilical hernia repair as a child.   CURRENT MEDICATIONS:   Over-the-counter iron.   DRUG SENSITIVITIES:  PENICILLIN causes throat swelling.   Last menstrual period, August 03, 2006.   SOCIAL HISTORY:  The patient works full time as a Medical illustrator.   REVIEW OF SYSTEMS:  Negative for chest pain, shortness of breath,  syncope, dysuria, urinary frequency, stress urinary incontinence, or  difficulty with defecation.   PHYSICAL EXAMINATION:  GENERAL:  The patient is a well-developed white  female in no acute distress.  VITAL SIGNS:  Temperature 98, blood pressure 118/80.  NECK:  Thyroid not enlarged.  HEART:  Regular rate and rhythm.  CHEST:  Clear to auscultation and percussion.  BREASTS:  No dominant masses, discharge, skin changes, or nipple  retraction.  BACK:  No CVA tenderness.  ABDOMEN:  Soft without masses or organomegaly.  EXTREMITIES:  Some varicosities, but no clubbing, cyanosis, or edema.  PELVIC:  EG/BUS within normal limits.  The vagina is rugous.  The cervix  has several nabothian cysts.  The uterus is upper limits of normal size,  posterior, with slightly decreased mobility.  The adnexa show no  palpable masses.  Rectovaginal confirms.   IMPRESSION:  1. Menorrhagia.  2. Uterine fibroid.   DISPOSITION:  A discussion was held with the patient concerning options  for management, and she wishes to proceed with hysterectomy.  She is  scheduled for a total vaginal hysterectomy at [facility] on September 08, 2006.  The risks of anesthesia, bleeding, infection, and damage to  adjacent organs have all been explained to her, and she wishes to  proceed.      Hal Morales, M.D.  Electronically Signed     VPH/MEDQ  D:  09/03/2006  T:  09/03/2006  Job:  [id number]

## 2011-04-26 ENCOUNTER — Emergency Department (HOSPITAL_COMMUNITY): Payer: Self-pay

## 2011-04-26 ENCOUNTER — Emergency Department (HOSPITAL_COMMUNITY)
Admission: EM | Admit: 2011-04-26 | Discharge: 2011-04-26 | Disposition: A | Discharge status: Home / Self Care | Payer: Self-pay | Attending: Emergency Medicine | Admitting: Emergency Medicine

## 2011-04-26 DIAGNOSIS — J189 Pneumonia, unspecified organism: Secondary | ICD-10-CM | POA: Insufficient documentation

## 2011-04-26 LAB — URINE MICROSCOPIC-ADD ON

## 2011-04-26 LAB — URINALYSIS, ROUTINE W REFLEX MICROSCOPIC
Glucose, UA: NEGATIVE mg/dL
Ketones, ur: NEGATIVE mg/dL
Leukocytes, UA: NEGATIVE
Nitrite: NEGATIVE
Protein, ur: 30 mg/dL — AB

## 2011-04-26 LAB — CBC
Platelets: 196 10*3/uL (ref 150–400)
RBC: 4.86 MIL/uL (ref 3.87–5.11)
RDW: 13.2 % (ref 11.5–15.5)
WBC: 12.5 10*3/uL — ABNORMAL HIGH (ref 4.0–10.5)

## 2011-04-26 LAB — DIFFERENTIAL
Basophils Absolute: 0 10*3/uL (ref 0.0–0.1)
Eosinophils Relative: 0 % (ref 0–5)
Lymphocytes Relative: 4 % — ABNORMAL LOW (ref 12–46)
Lymphs Abs: 0.5 10*3/uL — ABNORMAL LOW (ref 0.7–4.0)
Neutro Abs: 11.1 10*3/uL — ABNORMAL HIGH (ref 1.7–7.7)
Neutrophils Relative %: 88 % — ABNORMAL HIGH (ref 43–77)

## 2011-04-26 LAB — GLUCOSE, CAPILLARY

## 2011-04-26 LAB — BASIC METABOLIC PANEL
CO2: 27 mEq/L (ref 19–32)
Calcium: 9.1 mg/dL (ref 8.4–10.5)
Creatinine, Ser: 0.62 mg/dL (ref 0.50–1.10)
GFR calc Af Amer: >90 mL/min (ref >90)
GFR calc non Af Amer: >90 mL/min (ref >90)
Sodium: 132 mEq/L — ABNORMAL LOW (ref 135–145)

## 2011-04-26 MED ORDER — DOXYCYCLINE HYCLATE 100 MG PO CAPS: 100.0000 mg | ORAL_CAPSULE | Freq: Two times a day (BID) | ORAL | Status: AC

## 2011-04-26 MED ORDER — ONDANSETRON HCL 4 MG/2ML IJ SOLN
4.0000 mg | Freq: Once | INTRAMUSCULAR | Status: AC
  Administered 2011-04-26: 4 mg via INTRAVENOUS
  Filled 2011-04-26: qty 2

## 2011-04-26 MED ORDER — ACETAMINOPHEN 325 MG PO TABS
650.0000 mg | ORAL_TABLET | Freq: Once | ORAL | Status: AC
  Administered 2011-04-26: 650 mg via ORAL
  Filled 2011-04-26: qty 2

## 2011-04-26 MED ORDER — MOXIFLOXACIN HCL 400 MG PO TABS: 400.0000 mg | ORAL_TABLET | Freq: Every day | ORAL | Status: AC

## 2011-04-26 MED ORDER — IBUPROFEN 800 MG PO TABS
800.0000 mg | ORAL_TABLET | Freq: Once | ORAL | Status: AC
  Administered 2011-04-26: 800 mg via ORAL
  Filled 2011-04-26: qty 1

## 2011-04-26 MED ORDER — MORPHINE SULFATE 4 MG/ML IJ SOLN
4.0000 mg | Freq: Once | INTRAMUSCULAR | Status: AC
  Administered 2011-04-26: 4 mg via INTRAVENOUS
  Filled 2011-04-26: qty 1

## 2011-04-26 MED ORDER — SODIUM CHLORIDE 0.9 % IV BOLUS (SEPSIS)
1000.0000 mL | Freq: Once | INTRAVENOUS | Status: AC
  Administered 2011-04-26: 1000 mL via INTRAVENOUS

## 2011-04-26 MED ORDER — MOXIFLOXACIN HCL IN NACL 400 MG/250ML IV SOLN
400.0000 mg | Freq: Once | INTRAVENOUS | Status: AC
  Administered 2011-04-26: 400 mg via INTRAVENOUS
  Filled 2011-04-26: qty 250

## 2011-04-26 NOTE — ED Notes (Signed)
Pt states has "been sick since Monday with nausea, vomiting, headache and cough". Pt states has had fever for past 2 days, has taken tylenol with some relief. Pt reports hoping it would pass and she would start to feel better however has gotten progressively worsen over past 5 days. Pt denies abdominal pain and diarrhea.

## 2011-04-26 NOTE — ED Notes (Signed)
Pt states feels better at this time.

## 2011-04-26 NOTE — ED Notes (Signed)
Pt presents with c/o vomiting since Monday. Pt states she also thinks her glucose may be low. Pt with fever of 103.1 as well. NAD at this time. Pt to triage via w/c.

## 2011-04-26 NOTE — ED Provider Notes (Addendum)
History   This chart was scribed for Sydney Gaskins, MD by Clarita Crane. The patient was seen in room APA14/APA14 and the patient's care was started at 3:19PM.   CSN: [id number] Arrival date & time: 04/26/2011  3:08 PM   First MD Initiated Contact with Patient 04/26/11 1516      Chief Complaint  Patient presents with  . Emesis   HPI [name] is a 45 y.o. female who presents to the Emergency Department complaining of constant moderate to severe nausea and vomiting onset 5 days ago and persistent since but worse today with associated fever, left sided back pain, HA which started 2 days ago, cough which started yesterday and intermittent SOB. Patient notes having multiple episodes of vomiting daily. Reports nausea and vomiting not relieved with use of Phenergan. Denies hematemesis, diarrhea, dysuria, vaginal d/c, abdominal pain, rash, dysphagia. Patient with h/o abdominal hysterectomy.  PCP- Dr. Eldred Manges at [facility]  History reviewed. No pertinent past medical history.  Past Surgical History  Procedure Date  . Knee surgery   . Abdominal hysterectomy     History reviewed. No pertinent family history.  History  Substance Use Topics  . Smoking status: Never Smoker   . Smokeless tobacco: Not on file  . Alcohol Use: No    OB History    Grav Para Term Preterm Abortions TAB SAB Ect Mult Living                  Review of Systems 10 Systems reviewed and are negative for acute change except as noted in the HPI.  Allergies  Penicillins and Bee venom  Home Medications   Current Outpatient Rx  Name Route Sig Dispense Refill  . IBUPROFEN 200 MG PO TABS Oral Take 800 mg by mouth as needed. For headaches and pain     . PROMETHAZINE HCL 25 MG PO TABS Oral Take 25 mg by mouth every 6 (six) hours as needed. For nausea     . ALBUTEROL SULFATE HFA 108 (90 BASE) MCG/ACT IN AERS Inhalation Inhale 2 puffs into the lungs every 6 (six) hours as needed. For  shortness of breath       BP 134/67  Pulse 112  Temp(Src) 102.8 F (39.3 C) (Oral)  Resp 24  Ht 5\' 8"  (1.727 m)  Wt 250 lb (113.399 kg)  BMI 38.01 kg/m2  SpO2 96%  Physical Exam CONSTITUTIONAL: Well developed/well nourished HEAD AND FACE: Normocephalic/atraumatic EYES: EOMI/PERRL ENMT: Mucous membranes moist, oropharynx clear and moist NECK: supple no meningeal signs, no nuchal rigidity CV: tachycardia, S1/S2 noted, no murmurs/rubs/gallops noted LUNGS: Lungs are clear to auscultation bilaterally, no apparent distress, no wheezes,  ABDOMEN: soft, nontender, no rebound or guarding GU: left sided cva tenderness, no cva tenderness on right NEURO: Pt is awake/alert, moves all extremitiesx4 EXTREMITIES: pulses normal, full ROM SKIN: warm, color normal PSYCH: no abnormalities of mood noted   ED Course  Procedures (including critical care time)  DIAGNOSTIC STUDIES: Oxygen Saturation is 97% on room air, normal by my interpretation.    COORDINATION OF CARE: 6:12PM- Patient informed of possibility to admit to hospital pending status progress following further administration of IV fluids.    Labs Reviewed  GLUCOSE, CAPILLARY - Abnormal; Notable for the following:    Glucose-Capillary 141 (*)    All other components within normal limits  URINALYSIS, ROUTINE W REFLEX MICROSCOPIC - Abnormal; Notable for the following:    Appearance TURBID (*)    Specific Gravity, Urine >  1.030 (*)    Hgb urine dipstick LARGE (*)    Protein, ur 30 (*)    All other components within normal limits  BASIC METABOLIC PANEL - Abnormal; Notable for the following:    Sodium 132 (*)    Potassium 3.4 (*)    Chloride 94 (*)    Glucose, Bld 126 (*)    All other components within normal limits  CBC - Abnormal; Notable for the following:    WBC 12.5 (*)    All other components within normal limits  DIFFERENTIAL - Abnormal; Notable for the following:    Neutrophils Relative 88 (*)    Neutro Abs 11.1 (*)      Lymphocytes Relative 4 (*)    Lymphs Abs 0.5 (*)    All other components within normal limits  URINE MICROSCOPIC-ADD ON - Abnormal; Notable for the following:    Squamous Epithelial / LPF FEW (*)    Bacteria, UA FEW (*)    All other components within normal limits  POCT CBG MONITORING   Dg Chest 2 View  04/26/2011  *RADIOLOGY REPORT*  Clinical Data: Cough, fever, vomiting  CHEST - 2 VIEW  Comparison: 06/27/2010  Findings: Cardiomediastinal silhouette is stable.  There is hazy airspace disease suspicious for infiltrate in the left lower lobe. No pulmonary edema. Mild degenerative changes thoracic spine.  IMPRESSION: Hazy airspace disease in the left lower lobe suspicious for infiltrate.  Follow-up to resolution after appropriate treatment is recommended.  Original Report Authenticated By: Natasha Mead, M.D.       MDM  Nursing notes reviewed and considered in documentation All labs/vitals reviewed and considered xrays reviewed and considered  Xray shows pneumonia, but vitals improving BP 134/67  Pulse 112  Temp(Src) 102.8 F (39.3 C) (Oral)  Resp 24  Ht 5\' 8"  (1.727 m)  Wt 250 lb (113.399 kg)  BMI 38.01 kg/m2  SpO2 96%  7:38 PM BP 108/52  Pulse 88  Temp(Src) 98.9 F (37.2 C) (Oral)  Resp 20  Ht 5\' 8"  (1.727 m)  Wt 250 lb (113.399 kg)  BMI 38.01 kg/m2  SpO2 94% PT WELL APPEARING, AMBULATORY, NO SOB ON EXERTION, NO TACHYPNEA/WEAKNESS ON EXERTION SHE FEELS IMPROVED AND WOULD LIKE TO GO HOME  SHE IS LOW RISK FOR SERIOUS COMPLICATIONS FROM PNEUMONIA I DISCUSSED AT LENGTH SIGNS/SYMPTOMS OF WHEN TO RETURN SHE WILL SEE HER PCP AFTER THE WEEKEND RA PULSE OX WHEN I WAS IN ROOM WAS 95-96%     I personally performed the services described in this documentation, which was scribed in my presence. The recorded information has been reviewed and considered.    Sydney Gaskins, MD 04/26/11 1940  ALSO = SHE WILL BE GIVEN BOTH DOXY AND AVELOX AND WILL USE WHICH IS MORE  AFFORDABLE  Sydney Gaskins, MD 04/26/11 1940

## 2013-05-10 IMAGING — CR DG CHEST 2V
2 series · 2 of 2 positions shown · non-contrast
Comparison: 06/27/2010

CLINICAL DATA: Cough, fever, vomiting

CHEST - 2 VIEW

[view not recorded (1 of 2)]
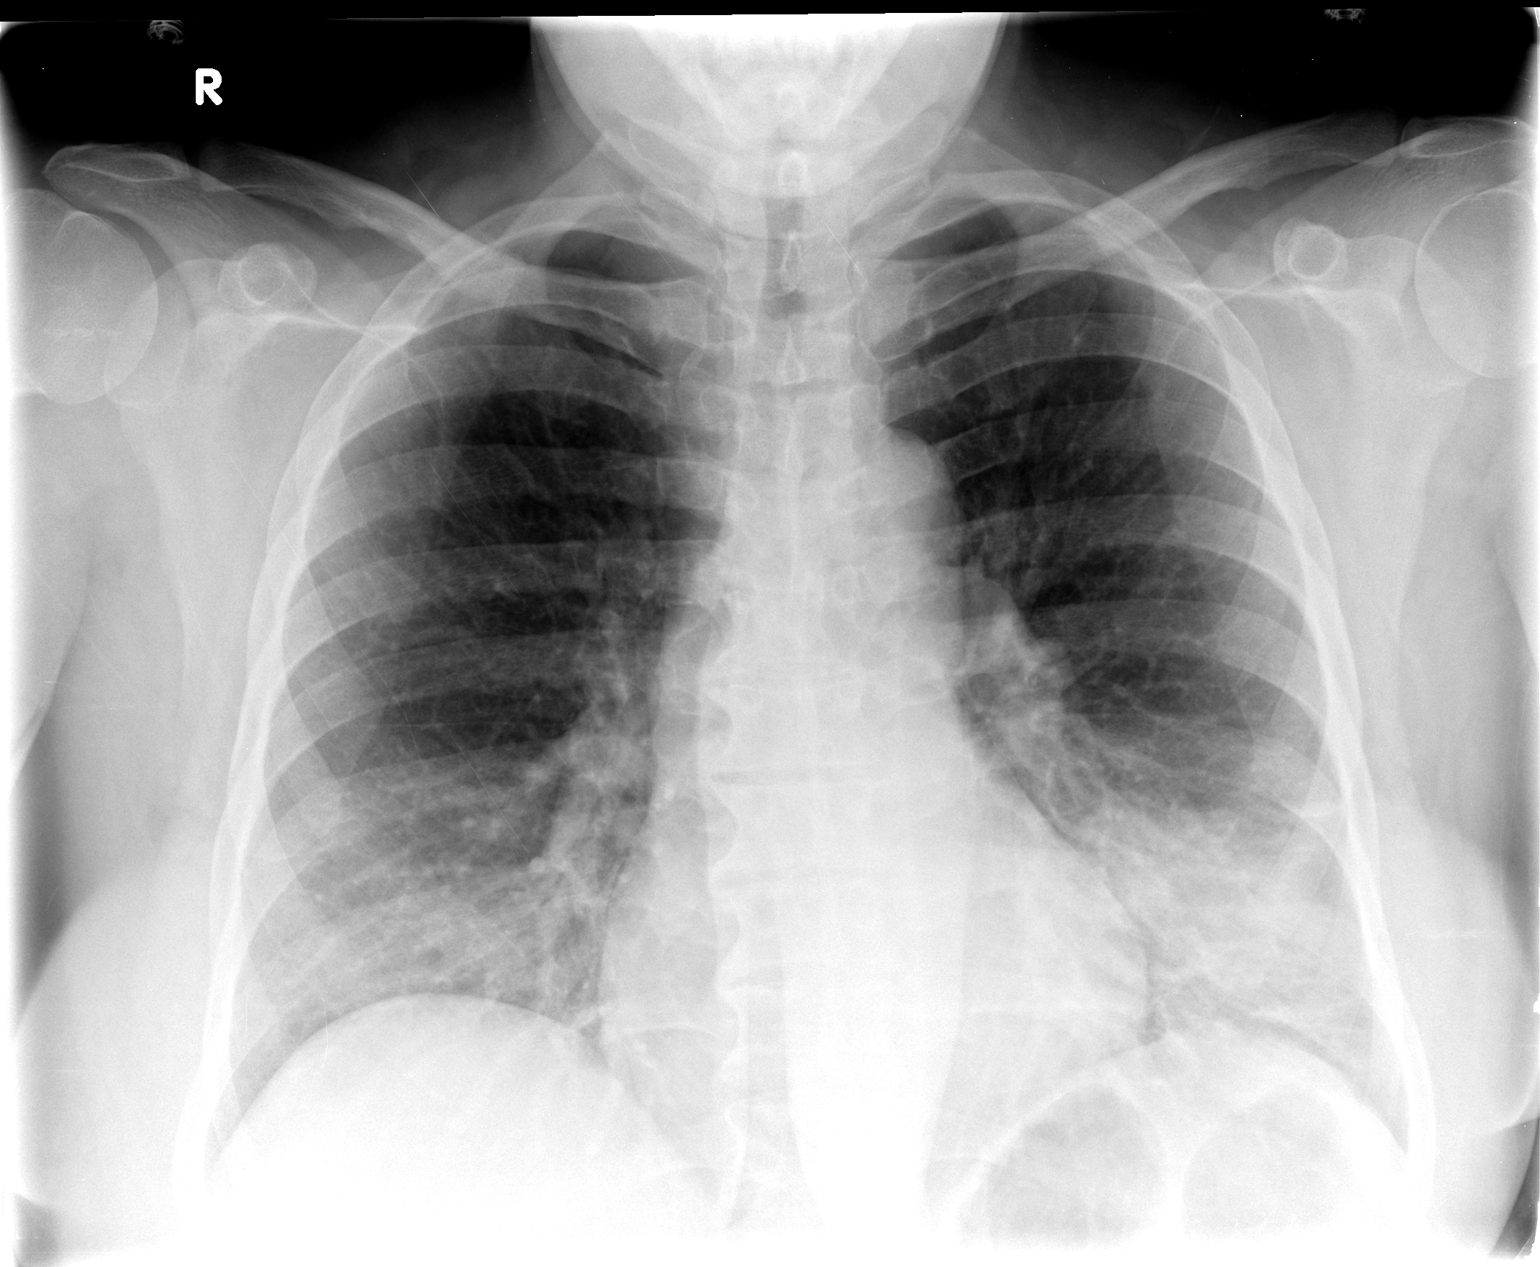

[view not recorded (2 of 2)]
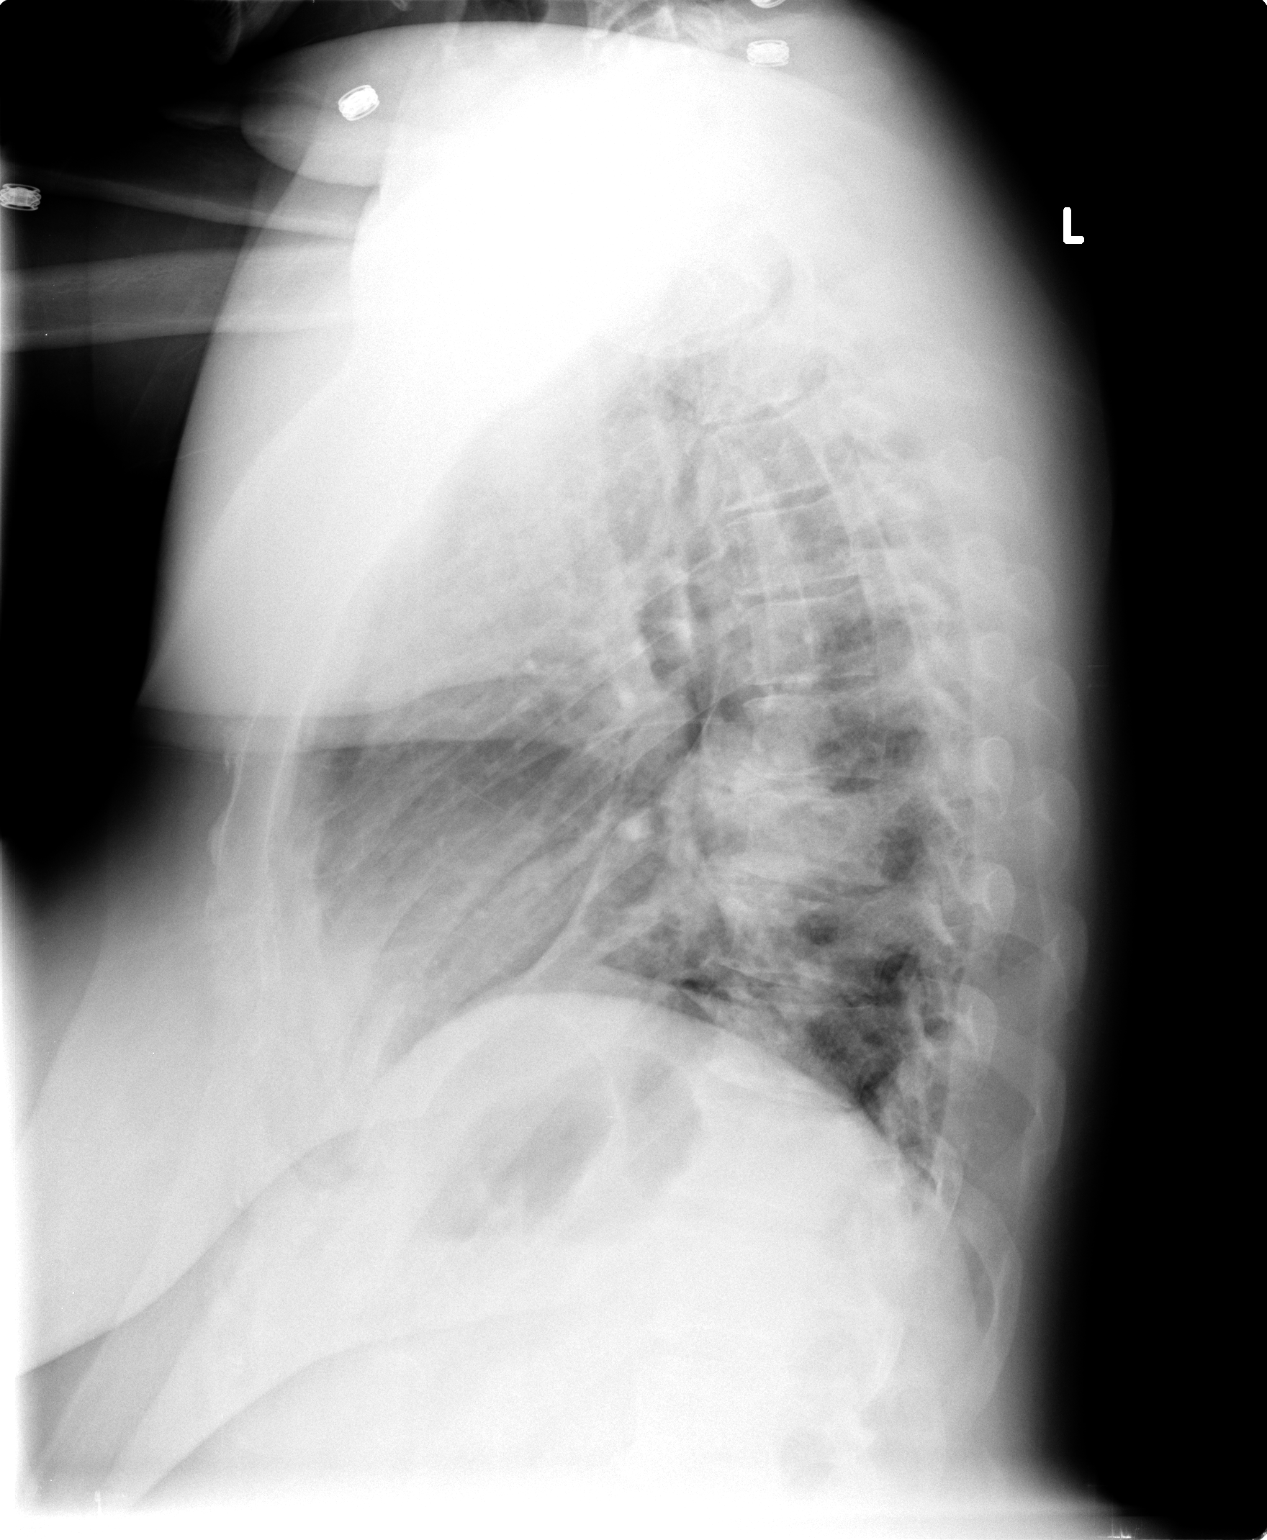

[2 of 2 positions shown; findings below may reference images not displayed]

FINDINGS: Cardiomediastinal silhouette is stable.  There is hazy
airspace disease suspicious for infiltrate in the left lower lobe.
No pulmonary edema. Mild degenerative changes thoracic spine.
IMPRESSION: Hazy airspace disease in the left lower lobe suspicious for
infiltrate.  Follow-up to resolution after appropriate treatment is
recommended.

## 2016-12-24 ENCOUNTER — Inpatient Hospital Stay (HOSPITAL_BASED_OUTPATIENT_CLINIC_OR_DEPARTMENT_OTHER)
Admission: EM | Admit: 2016-12-24 | Discharge: 2016-12-28 | DRG: 872 | Disposition: A | Discharge status: Home / Self Care | Payer: Self-pay | Attending: Internal Medicine | Admitting: Internal Medicine

## 2016-12-24 ENCOUNTER — Emergency Department (HOSPITAL_BASED_OUTPATIENT_CLINIC_OR_DEPARTMENT_OTHER): Payer: Self-pay

## 2016-12-24 ENCOUNTER — Encounter (HOSPITAL_BASED_OUTPATIENT_CLINIC_OR_DEPARTMENT_OTHER): Payer: Self-pay

## 2016-12-24 DIAGNOSIS — R739 Hyperglycemia, unspecified: Secondary | ICD-10-CM | POA: Diagnosis present

## 2016-12-24 DIAGNOSIS — D649 Anemia, unspecified: Secondary | ICD-10-CM | POA: Diagnosis present

## 2016-12-24 DIAGNOSIS — Z880 Allergy status to penicillin: Secondary | ICD-10-CM

## 2016-12-24 DIAGNOSIS — E876 Hypokalemia: Secondary | ICD-10-CM | POA: Diagnosis present

## 2016-12-24 DIAGNOSIS — I4891 Unspecified atrial fibrillation: Secondary | ICD-10-CM | POA: Diagnosis present

## 2016-12-24 DIAGNOSIS — J209 Acute bronchitis, unspecified: Secondary | ICD-10-CM | POA: Diagnosis present

## 2016-12-24 DIAGNOSIS — R778 Other specified abnormalities of plasma proteins: Secondary | ICD-10-CM | POA: Diagnosis present

## 2016-12-24 DIAGNOSIS — D72829 Elevated white blood cell count, unspecified: Secondary | ICD-10-CM | POA: Diagnosis present

## 2016-12-24 DIAGNOSIS — R079 Chest pain, unspecified: Secondary | ICD-10-CM | POA: Diagnosis present

## 2016-12-24 DIAGNOSIS — R072 Precordial pain: Secondary | ICD-10-CM

## 2016-12-24 DIAGNOSIS — R7989 Other specified abnormal findings of blood chemistry: Secondary | ICD-10-CM

## 2016-12-24 DIAGNOSIS — I480 Paroxysmal atrial fibrillation: Secondary | ICD-10-CM | POA: Diagnosis present

## 2016-12-24 DIAGNOSIS — I4892 Unspecified atrial flutter: Secondary | ICD-10-CM | POA: Diagnosis present

## 2016-12-24 DIAGNOSIS — A419 Sepsis, unspecified organism: Principal | ICD-10-CM | POA: Diagnosis present

## 2016-12-24 DIAGNOSIS — E669 Obesity, unspecified: Secondary | ICD-10-CM | POA: Diagnosis present

## 2016-12-24 DIAGNOSIS — I248 Other forms of acute ischemic heart disease: Secondary | ICD-10-CM | POA: Diagnosis present

## 2016-12-24 DIAGNOSIS — R059 Cough, unspecified: Secondary | ICD-10-CM | POA: Diagnosis present

## 2016-12-24 DIAGNOSIS — N390 Urinary tract infection, site not specified: Secondary | ICD-10-CM | POA: Diagnosis present

## 2016-12-24 DIAGNOSIS — Z6835 Body mass index (BMI) 35.0-35.9, adult: Secondary | ICD-10-CM

## 2016-12-24 DIAGNOSIS — R05 Cough: Secondary | ICD-10-CM | POA: Diagnosis present

## 2016-12-24 DIAGNOSIS — I456 Pre-excitation syndrome: Secondary | ICD-10-CM | POA: Diagnosis present

## 2016-12-24 HISTORY — DX: Chest pain, unspecified: R07.9

## 2016-12-24 HISTORY — DX: Sepsis, unspecified organism: A41.9

## 2016-12-24 HISTORY — DX: Pre-excitation syndrome: I45.6

## 2016-12-24 HISTORY — DX: Hypokalemia: E87.6

## 2016-12-24 HISTORY — DX: Elevated white blood cell count, unspecified: D72.829

## 2016-12-24 HISTORY — DX: Unspecified atrial fibrillation: I48.91

## 2016-12-24 LAB — CBC WITH DIFFERENTIAL/PLATELET
Band Neutrophils: 15 %
Basophils Absolute: 0 10*3/uL (ref 0.0–0.1)
Basophils Relative: 0 %
EOS ABS: 0 10*3/uL (ref 0.0–0.7)
EOS PCT: 0 %
HCT: 39.4 % (ref 36.0–46.0)
HEMOGLOBIN: 13.7 g/dL (ref 12.0–15.0)
LYMPHS PCT: 2 %
Lymphs Abs: 0.5 10*3/uL — ABNORMAL LOW (ref 0.7–4.0)
MCH: 30.4 pg (ref 26.0–34.0)
MCHC: 34.8 g/dL (ref 30.0–36.0)
MCV: 87.6 fL (ref 78.0–100.0)
MONO ABS: 1.4 10*3/uL — AB (ref 0.1–1.0)
MONOS PCT: 5 %
Myelocytes: 1 %
NEUTROS PCT: 77 %
Neutro Abs: 25.3 10*3/uL — ABNORMAL HIGH (ref 1.7–7.7)
PLATELETS: 253 10*3/uL (ref 150–400)
RBC: 4.5 MIL/uL (ref 3.87–5.11)
RDW: 13.4 % (ref 11.5–15.5)
WBC: 27.2 10*3/uL — AB (ref 4.0–10.5)

## 2016-12-24 LAB — TROPONIN I

## 2016-12-24 LAB — COMPREHENSIVE METABOLIC PANEL
ALT: 19 U/L (ref 14–54)
ANION GAP: 11 (ref 5–15)
AST: 16 U/L (ref 15–41)
Albumin: 3.6 g/dL (ref 3.5–5.0)
Alkaline Phosphatase: 47 U/L (ref 38–126)
BUN: 12 mg/dL (ref 6–20)
CHLORIDE: 99 mmol/L — AB (ref 101–111)
CO2: 23 mmol/L (ref 22–32)
Calcium: 8.8 mg/dL — ABNORMAL LOW (ref 8.9–10.3)
Creatinine, Ser: 0.94 mg/dL (ref 0.44–1.00)
Glucose, Bld: 166 mg/dL — ABNORMAL HIGH (ref 65–99)
POTASSIUM: 2.8 mmol/L — AB (ref 3.5–5.1)
Sodium: 133 mmol/L — ABNORMAL LOW (ref 135–145)
Total Bilirubin: 0.9 mg/dL (ref 0.3–1.2)
Total Protein: 7.8 g/dL (ref 6.5–8.1)

## 2016-12-24 LAB — MAGNESIUM: MAGNESIUM: 1.8 mg/dL (ref 1.7–2.4)

## 2016-12-24 MED ORDER — ADENOSINE 6 MG/2ML IV SOLN
12.0000 mg | Freq: Once | INTRAVENOUS | Status: AC
  Administered 2016-12-24: 12 mg via INTRAVENOUS

## 2016-12-24 MED ORDER — MAGNESIUM SULFATE IN D5W 1-5 GM/100ML-% IV SOLN
1.0000 g | Freq: Once | INTRAVENOUS | Status: AC
Start: 2016-12-24 — End: 2016-12-24
  Administered 2016-12-24: 1 g via INTRAVENOUS
  Filled 2016-12-24: qty 100

## 2016-12-24 MED ORDER — SODIUM CHLORIDE 0.9 % IV BOLUS (SEPSIS)
500.0000 mL | Freq: Once | INTRAVENOUS | Status: AC
  Administered 2016-12-24: 500 mL via INTRAVENOUS

## 2016-12-24 MED ORDER — DEXTROSE 5 % IV SOLN
5.0000 mg/h | INTRAVENOUS | Status: DC
  Administered 2016-12-24: 5 mg/h via INTRAVENOUS
  Filled 2016-12-24: qty 100

## 2016-12-24 MED ORDER — ASPIRIN 81 MG PO CHEW
324.0000 mg | CHEWABLE_TABLET | Freq: Once | ORAL | Status: AC
  Administered 2016-12-24: 324 mg via ORAL
  Filled 2016-12-24: qty 4

## 2016-12-24 MED ORDER — ADENOSINE 6 MG/2ML IV SOLN
6.0000 mg | Freq: Once | INTRAVENOUS | Status: AC
  Administered 2016-12-24: 6 mg via INTRAVENOUS

## 2016-12-24 MED ORDER — HEPARIN BOLUS VIA INFUSION
4500.0000 [IU] | Freq: Once | INTRAVENOUS | Status: AC
  Administered 2016-12-24: 4500 [IU] via INTRAVENOUS

## 2016-12-24 MED ORDER — POTASSIUM CHLORIDE 10 MEQ/100ML IV SOLN
10.0000 meq | Freq: Once | INTRAVENOUS | Status: AC
  Administered 2016-12-24: 10 meq via INTRAVENOUS
  Filled 2016-12-24: qty 100

## 2016-12-24 MED ORDER — ADENOSINE 6 MG/2ML IV SOLN
INTRAVENOUS | Status: AC
  Administered 2016-12-24: 6 mg via INTRAVENOUS
  Filled 2016-12-24: qty 6

## 2016-12-24 MED ORDER — DILTIAZEM HCL 25 MG/5ML IV SOLN
15.0000 mg | Freq: Once | INTRAVENOUS | Status: AC
  Administered 2016-12-24: 15 mg via INTRAVENOUS

## 2016-12-24 MED ORDER — HEPARIN (PORCINE) IN NACL 100-0.45 UNIT/ML-% IJ SOLN
1300.0000 [IU]/h | INTRAMUSCULAR | Status: DC
  Administered 2016-12-24: 1300 [IU]/h via INTRAVENOUS
  Filled 2016-12-24: qty 250

## 2016-12-24 MED ORDER — DILTIAZEM LOAD VIA INFUSION
15.0000 mg | Freq: Once | INTRAVENOUS | Status: DC
  Filled 2016-12-24: qty 15

## 2016-12-24 MED ORDER — DILTIAZEM HCL 25 MG/5ML IV SOLN
INTRAVENOUS | Status: AC
  Administered 2016-12-24: 15 mg
  Filled 2016-12-24: qty 5

## 2016-12-24 MED ORDER — POTASSIUM CHLORIDE CRYS ER 20 MEQ PO TBCR
40.0000 meq | EXTENDED_RELEASE_TABLET | Freq: Once | ORAL | Status: AC
  Administered 2016-12-24: 40 meq via ORAL
  Filled 2016-12-24: qty 2

## 2016-12-24 NOTE — Progress Notes (Signed)
This is a no charge note   [facility] Mad[facility]  51 year old lady without significant past medical history, who presents with chest pain and diaphoresis, found to have A. fib with RVR, heart rate up to 231. Patient was treated with 2 doses of adenosine, and started with Cardizem drip, current heart rate is at150s. Chest pain has improved.   Patient was found to have WBC 27.2, negative troponin, potassium 2.8, magnesium 1.8, sodium 133, creatinine normal, chest x-ray negative, oxygen saturation 96%. Pt is accepted to SDU for obs. Card fellow was consulted by EDP, but need to call him again if need consultation per EDP. Hypokalemia was repleted. Will give 1 g of magnesium sulfate.   Please call manager or [facility] at [phone] when pt arrives to floor  Lorretta HarpXilin Mayjor Ager, MD  [facility] Pager [phone]  If 7PM-7AM, please contact night-coverage [email or web address] Password [email or web address] 12/24/2016, 9:17 PM

## 2016-12-24 NOTE — Progress Notes (Addendum)
ANTICOAGULATION CONSULT NOTE - Initial Consult  Pharmacy Consult for heparin Indication: atrial fibrillation  Allergies  Allergen Reactions  . Penicillins Shortness Of Breath and Swelling    Swells throat  . Bee Venom     Patient Measurements: Height: 5\' 9"  (175.3 cm) Weight: 238 lb 15.7 oz (108.4 kg) IBW/kg (Calculated) : 66.2 Heparin Dosing Weight: 90.4 kg   Vital Signs: BP: 108/64 (06/26 2100) Pulse Rate: 151 (06/26 2100)  Labs:  Recent Labs  12/24/16 1910  HGB 13.7  HCT 39.4  PLT 253  CREATININE 0.94  TROPONINI <0.03    CrCl cannot be calculated (Unknown ideal weight.).   Medical History: History reviewed. No pertinent past medical history.  Assessment: 51 yo female admitted with chest pain found to have atrial fibrillation. Pharmacy consulted to dose heparin. RN reports patient does not take any PTA anticoagulation. CBC stable with no overt s/s bleeding noted.   Goal of Therapy:  Heparin level 0.3-0.7 units/ml Monitor platelets by anticoagulation protocol: Yes   Plan:  Heparin 4500 units IV x1, then start infusion at 1300 units/hr Heparin level in 6 hours Daily heparin level and CBC Monitor s/s bleeding F/u long-term anticoagulation plans  York CeriseKatherine Cook, PharmD Pharmacy Resident  Pager [phone] 12/24/16 9:25 PM

## 2016-12-24 NOTE — ED Provider Notes (Signed)
[facility] Provider Note   CSN: [id number] Arrival date & time: 12/24/16  1839  By signing my name below, I, [name], attest that this documentation has been prepared under the direction and in the presence of Lorretta Harp, MD . Electronically Signed: [name], Scribe. 12/24/2016. 11:08 PM.  History   Chief Complaint Chief Complaint  Patient presents with  . Chest Pain   HPI Comments [name] is a 51 y.o. female with no reported PMHx, who presents to the Emergency Department with complaints of sudden onset chest pain x1hr.  No leg swelling or pain.  She states that the pain has been constant since onset, currently rated a 10/10 in severity.  Pt reports a family history of irregular heart rate, CAD.  No FMHx of wolff parkinson white.  The history is provided by the patient. No language interpreter was used.   History reviewed. No pertinent past medical history.  Patient Active Problem List   Diagnosis Date Noted  . Chest pain 12/24/2016  . Atrial fibrillation with RVR (HCC) 12/24/2016  . Hypokalemia 12/24/2016  . Leukocytosis 12/24/2016  . Atrial fibrillation with rapid ventricular response (HCC) 12/24/2016    Past Surgical History:  Procedure Laterality Date  . ABDOMINAL HYSTERECTOMY    . KNEE SURGERY      OB History    No data available       Home Medications    Prior to Admission medications   Not on File    Family History No family history on file.  Social History Social History  Substance Use Topics  . Smoking status: Never Smoker  . Smokeless tobacco: Never Used  . Alcohol use Yes     Comment: occ     Allergies   Penicillins and Bee venom   Review of Systems Review of Systems  Cardiovascular: Positive for chest pain. Negative for leg swelling.  Musculoskeletal: Negative for myalgias.  All other systems reviewed and are negative.  Physical Exam Updated Vital Signs BP 105/62   Pulse 88   Temp 98.8 F  (37.1 C) (Oral)   Resp 20   Ht 5\' 9"  (1.753 m)   Wt 108.4 kg (238 lb 15.7 oz)   SpO2 98%   BMI 35.29 kg/m   Physical Exam  Constitutional: She is oriented to person, place, and time. She appears well-developed and well-nourished. She appears distressed.  HENT:  Head: Normocephalic and atraumatic.  Cardiovascular:  Tachycardic  Pulmonary/Chest: Effort normal and breath sounds normal. No respiratory distress.  Abdominal: Soft. There is no tenderness. There is no rebound and no guarding.  Musculoskeletal: She exhibits no edema or tenderness.  Neurological: She is alert and oriented to person, place, and time.  Skin: Skin is warm and dry.  Psychiatric: She has a normal mood and affect. Her behavior is normal.  Nursing note and vitals reviewed.  ED Treatments / Results  DIAGNOSTIC STUDIES:   COORDINATION OF CARE: 6:51 PM Discussed treatment plan with pt at bedside and pt agreed to plan.  7:05 PM Pt reports that her chest pain has improved significantly, with a HR in the 130s to 140s.  7:45 PM Pt rechecked.   Labs (all labs ordered are listed, but only abnormal results are displayed) Labs Reviewed  COMPREHENSIVE METABOLIC PANEL - Abnormal; Notable for the following:       Result Value   Sodium 133 (*)    Potassium 2.8 (*)    Chloride 99 (*)  Glucose, Bld 166 (*)    Calcium 8.8 (*)    All other components within normal limits  CBC WITH DIFFERENTIAL/PLATELET - Abnormal; Notable for the following:    WBC 27.2 (*)    Neutro Abs 25.3 (*)    Lymphs Abs 0.5 (*)    Monocytes Absolute 1.4 (*)    All other components within normal limits  TROPONIN I  MAGNESIUM  HEPARIN LEVEL (UNFRACTIONATED)  CBC    EKG  EKG Interpretation  Date/Time:  Tuesday December 24 2016 19:03:19 EDT Ventricular Rate:  148 PR Interval:    QRS Duration: 92 QT Interval:  304 QTC Calculation: 477 R Axis:   56 Text Interpretation:  Atrial fibrillation ST depression, consider ischemia, diffuse lds  Confirmed by Lincoln Brigham [id number]) on 12/24/2016 9:17:08 PM       Radiology Dg Chest Port 1 View  Result Date: 12/24/2016 CLINICAL DATA:  Acute onset of chest pain earlier this evening. EXAM: PORTABLE CHEST 1 VIEW COMPARISON:  04/26/2011, 12 28,011 and earlier, including CTA chest 09/22/2006. FINDINGS: Cardiac silhouette mildly enlarged. Hilar and mediastinal contours otherwise unremarkable. Lungs clear. Bronchovascular markings normal. Pulmonary vascularity normal. No visible pleural effusions. No pneumothorax. External pacing pads are present. IMPRESSION: Mild cardiomegaly.  No acute cardiopulmonary disease. Electronically Signed   By: Hulan Saas M.D.   On: 12/24/2016 20:20    Procedures Procedures (including critical care time) CRITICAL CARE Performed by: Tilden Fossa   Total critical care time: 35 minutes  Critical care time was exclusive of separately billable procedures and treating other patients.  Critical care was necessary to treat or prevent imminent or life-threatening deterioration.  Critical care was time spent personally by me on the following activities: development of treatment plan with patient and/or surrogate as well as nursing, discussions with consultants, evaluation of patient's response to treatment, examination of patient, obtaining history from patient or surrogate, ordering and performing treatments and interventions, ordering and review of laboratory studies, ordering and review of radiographic studies, pulse oximetry and re-evaluation of patient's condition.  Medications Ordered in ED Medications  diltiazem (CARDIZEM) 1 mg/mL load via infusion 15 mg (15 mg Intravenous Not Given 12/24/16 1915)    And  diltiazem (CARDIZEM) 100 mg in dextrose 5 % 100 mL (1 mg/mL) infusion (0 mg/hr Intravenous Stopped 12/24/16 2203)  heparin ADULT infusion 100 units/mL (25000 units/263mL sodium chloride 0.45%) (1,300 Units/hr Intravenous New Bag/Given 12/24/16 2211)  sodium  chloride 0.9 % bolus 500 mL (0 mLs Intravenous Stopped 12/24/16 1930)  diltiazem (CARDIZEM) 25 MG/5ML injection (15 mg  Given 12/24/16 1901)  adenosine (ADENOCARD) 6 MG/2ML injection 6 mg (6 mg Intravenous Given 12/24/16 1854)  adenosine (ADENOCARD) 6 MG/2ML injection 12 mg (12 mg Intravenous Given 12/24/16 1856)  aspirin chewable tablet 324 mg (324 mg Oral Given 12/24/16 1917)  diltiazem (CARDIZEM) injection 15 mg (15 mg Intravenous Given 12/24/16 1901)  potassium chloride SA (K-DUR,KLOR-CON) CR tablet 40 mEq (40 mEq Oral Given 12/24/16 1954)  potassium chloride 10 mEq in 100 mL IVPB (0 mEq Intravenous Stopped 12/24/16 2103)  magnesium sulfate IVPB 1 g 100 mL (0 g Intravenous Stopped 12/24/16 2240)  heparin bolus via infusion 4,500 Units (4,500 Units Intravenous Bolus from Bag 12/24/16 2210)     Initial Impression / Assessment and Plan / ED Course  I have reviewed the triage vital signs and the nursing notes.  Pertinent labs & imaging results that were available during my care of the patient were reviewed by me and considered in  my medical decision making (see chart for details).    Patient here for evaluation of sudden onset chest pain. She has significant tachycardia on initial evaluation with heart rate from the 220s to 240s. She was treated with Adenocard with underlying rhythm of rapid atrial fibrillation. She was started on diltiazem bolus and drip with persistent rate in the 140s to 150s but patient symptomatically much improved. Discussed with on-call fellow with cardiology service - does not recommend cardioversion at this time given symptom improvement with Cardizem. Hospitalist was contacted for admission for new onset A. fib with RVR. While patient was awaiting transport she converted to a sinus rhythm with a rate in the 80s. She is completely asymptomatic on repeat assessment. Cardizem drip was discontinued.   I personally performed the services described in this documentation, which was  scribed in my presence. The recorded information has been reviewed and is accurate.   Final Clinical Impressions(s) / ED Diagnoses   Final diagnoses:  Precordial pain  Atrial fibrillation with rapid ventricular response [facility](HCC)    New Prescriptions New Prescriptions   No medications on file     Tilden Fossaees, Arvle Grabe, MD 12/24/16 2312

## 2016-12-24 NOTE — ED Notes (Signed)
Pt up to bedside commode without difficulty. Pt converted to NSR on monitor rate of 87 bpm. Dr. Madilyn Hookees made aware.

## 2016-12-24 NOTE — ED Triage Notes (Signed)
C/o CP x 1 hour-pt pale, diaphoretic-taken to tx area via w/c

## 2016-12-24 NOTE — ED Notes (Signed)
[facility] here for transport. 

## 2016-12-24 NOTE — ED Notes (Signed)
EDP at BS 

## 2016-12-25 ENCOUNTER — Encounter (HOSPITAL_COMMUNITY): Payer: Self-pay | Admitting: Internal Medicine

## 2016-12-25 DIAGNOSIS — I4891 Unspecified atrial fibrillation: Secondary | ICD-10-CM

## 2016-12-25 DIAGNOSIS — R7989 Other specified abnormal findings of blood chemistry: Secondary | ICD-10-CM

## 2016-12-25 DIAGNOSIS — R778 Other specified abnormalities of plasma proteins: Secondary | ICD-10-CM | POA: Diagnosis present

## 2016-12-25 DIAGNOSIS — R05 Cough: Secondary | ICD-10-CM

## 2016-12-25 DIAGNOSIS — R079 Chest pain, unspecified: Secondary | ICD-10-CM

## 2016-12-25 DIAGNOSIS — I456 Pre-excitation syndrome: Secondary | ICD-10-CM

## 2016-12-25 DIAGNOSIS — R059 Cough, unspecified: Secondary | ICD-10-CM | POA: Diagnosis present

## 2016-12-25 DIAGNOSIS — A419 Sepsis, unspecified organism: Secondary | ICD-10-CM | POA: Diagnosis present

## 2016-12-25 HISTORY — DX: Other specified abnormal findings of blood chemistry: R79.89

## 2016-12-25 LAB — MRSA PCR SCREENING: MRSA by PCR: NEGATIVE

## 2016-12-25 LAB — PROCALCITONIN: PROCALCITONIN: 0.19 ng/mL

## 2016-12-25 LAB — LACTIC ACID, PLASMA
LACTIC ACID, VENOUS: 1.3 mmol/L (ref 0.5–1.9)
LACTIC ACID, VENOUS: 0.8 mmol/L (ref 0.5–1.9)

## 2016-12-25 LAB — URINALYSIS, ROUTINE W REFLEX MICROSCOPIC
Bilirubin Urine: NEGATIVE
GLUCOSE, UA: NEGATIVE mg/dL
KETONES UR: 5 mg/dL — AB
Nitrite: NEGATIVE
PROTEIN: 30 mg/dL — AB
Specific Gravity, Urine: 1.025 (ref 1.005–1.030)
pH: 5 (ref 5.0–8.0)

## 2016-12-25 LAB — HEPARIN LEVEL (UNFRACTIONATED): Heparin Unfractionated: <0.1 IU/mL — ABNORMAL LOW (ref 0.30–0.70)

## 2016-12-25 LAB — TROPONIN I
TROPONIN I: 0.86 ng/mL — AB (ref <0.03)
TROPONIN I: 0.55 ng/mL — AB (ref <0.03)
Troponin I: 0.49 ng/mL (ref <0.03)

## 2016-12-25 LAB — LIPID PANEL
CHOL/HDL RATIO: 4.8 ratio
CHOLESTEROL: 158 mg/dL (ref 0–200)
HDL: 33 mg/dL — AB (ref >40)
LDL Cholesterol: 93 mg/dL (ref 0–99)
Triglycerides: 158 mg/dL — ABNORMAL HIGH (ref <150)
VLDL: 32 mg/dL (ref 0–40)

## 2016-12-25 LAB — RAPID URINE DRUG SCREEN, HOSP PERFORMED
AMPHETAMINES: NOT DETECTED
BENZODIAZEPINES: NOT DETECTED
Barbiturates: NOT DETECTED
COCAINE: NOT DETECTED
OPIATES: NOT DETECTED
Tetrahydrocannabinol: NOT DETECTED

## 2016-12-25 LAB — EXPECTORATED SPUTUM ASSESSMENT W GRAM STAIN, RFLX TO RESP C

## 2016-12-25 LAB — BRAIN NATRIURETIC PEPTIDE: B NATRIURETIC PEPTIDE 5: 130.6 pg/mL — AB (ref 0.0–100.0)

## 2016-12-25 LAB — T4, FREE: FREE T4: 0.83 ng/dL (ref 0.61–1.12)

## 2016-12-25 LAB — STREP PNEUMONIAE URINARY ANTIGEN: STREP PNEUMO URINARY ANTIGEN: NEGATIVE

## 2016-12-25 LAB — HIV ANTIBODY (ROUTINE TESTING W REFLEX): HIV Screen 4th Generation wRfx: NONREACTIVE

## 2016-12-25 LAB — TSH: TSH: 1.578 u[IU]/mL (ref 0.350–4.500)

## 2016-12-25 MED ORDER — LEVOFLOXACIN IN D5W 750 MG/150ML IV SOLN
750.0000 mg | INTRAVENOUS | Status: DC
  Administered 2016-12-25 – 2016-12-26 (×2): 750 mg via INTRAVENOUS
  Filled 2016-12-25 (×2): qty 150

## 2016-12-25 MED ORDER — LEVOFLOXACIN IN D5W 750 MG/150ML IV SOLN
750.0000 mg | INTRAVENOUS | Status: DC
  Filled 2016-12-25: qty 150

## 2016-12-25 MED ORDER — ONDANSETRON HCL 4 MG/2ML IJ SOLN: 4.0000 mg | Freq: Four times a day (QID) | INTRAMUSCULAR | Status: DC | PRN

## 2016-12-25 MED ORDER — LEVALBUTEROL HCL 1.25 MG/0.5ML IN NEBU: 1.2500 mg | INHALATION_SOLUTION | Freq: Three times a day (TID) | RESPIRATORY_TRACT | Status: DC | PRN

## 2016-12-25 MED ORDER — AZITHROMYCIN 250 MG PO TABS: 250.0000 mg | ORAL_TABLET | Freq: Every day | ORAL | Status: DC

## 2016-12-25 MED ORDER — ASPIRIN EC 325 MG PO TBEC
325.0000 mg | DELAYED_RELEASE_TABLET | Freq: Every day | ORAL | Status: DC
  Administered 2016-12-25 – 2016-12-27 (×3): 325 mg via ORAL
  Filled 2016-12-25 (×3): qty 1

## 2016-12-25 MED ORDER — SODIUM CHLORIDE 0.9 % IV SOLN
INTRAVENOUS | Status: DC
  Administered 2016-12-25: 02:00:00 via INTRAVENOUS

## 2016-12-25 MED ORDER — MORPHINE SULFATE (PF) 2 MG/ML IV SOLN: 2.0000 mg | INTRAVENOUS | Status: DC | PRN

## 2016-12-25 MED ORDER — NITROGLYCERIN 0.4 MG SL SUBL
0.4000 mg | SUBLINGUAL_TABLET | SUBLINGUAL | Status: DC | PRN
  Administered 2016-12-25: 0.4 mg via SUBLINGUAL
  Filled 2016-12-25: qty 1

## 2016-12-25 MED ORDER — ZOLPIDEM TARTRATE 5 MG PO TABS: 5.0000 mg | ORAL_TABLET | Freq: Every evening | ORAL | Status: DC | PRN

## 2016-12-25 MED ORDER — LEVALBUTEROL HCL 1.25 MG/0.5ML IN NEBU
1.2500 mg | INHALATION_SOLUTION | Freq: Four times a day (QID) | RESPIRATORY_TRACT | Status: DC
  Filled 2016-12-25: qty 0.5

## 2016-12-25 MED ORDER — SODIUM CHLORIDE 0.9 % IV BOLUS (SEPSIS)
2000.0000 mL | Freq: Once | INTRAVENOUS | Status: AC
  Administered 2016-12-25: 2000 mL via INTRAVENOUS

## 2016-12-25 MED ORDER — DM-GUAIFENESIN ER 30-600 MG PO TB12
1.0000 | ORAL_TABLET | Freq: Two times a day (BID) | ORAL | Status: DC
  Administered 2016-12-25 – 2016-12-28 (×8): 1 via ORAL
  Filled 2016-12-25 (×8): qty 1

## 2016-12-25 MED ORDER — SODIUM CHLORIDE 0.9 % IV BOLUS (SEPSIS)
1000.0000 mL | Freq: Once | INTRAVENOUS | Status: AC
  Administered 2016-12-25: 1000 mL via INTRAVENOUS

## 2016-12-25 MED ORDER — AZITHROMYCIN 250 MG PO TABS
500.0000 mg | ORAL_TABLET | Freq: Every day | ORAL | Status: AC
  Administered 2016-12-25: 500 mg via ORAL
  Filled 2016-12-25: qty 2

## 2016-12-25 MED ORDER — ALPRAZOLAM 0.25 MG PO TABS: 0.2500 mg | ORAL_TABLET | Freq: Two times a day (BID) | ORAL | Status: DC | PRN

## 2016-12-25 MED ORDER — CARVEDILOL 3.125 MG PO TABS: 3.1250 mg | ORAL_TABLET | Freq: Two times a day (BID) | ORAL | Status: DC

## 2016-12-25 MED ORDER — POTASSIUM CHLORIDE CRYS ER 20 MEQ PO TBCR
40.0000 meq | EXTENDED_RELEASE_TABLET | Freq: Once | ORAL | Status: AC
  Administered 2016-12-25: 40 meq via ORAL
  Filled 2016-12-25: qty 2

## 2016-12-25 MED ORDER — ACETAMINOPHEN 325 MG PO TABS
650.0000 mg | ORAL_TABLET | ORAL | Status: DC | PRN
  Administered 2016-12-25: 650 mg via ORAL
  Filled 2016-12-25: qty 2

## 2016-12-25 NOTE — Plan of Care (Signed)
Problem: Safety: Goal: Ability to remain free from injury will improve Outcome: Progressing Fall risk bundle remains in place. No injuries, falls or skin breakdown this shift. Will continue to monitor.

## 2016-12-25 NOTE — H&P (Addendum)
History and Physical    [name] AVW:[id number] DOB: 12-27-1965 DOA: 12/24/2016  Referring MD/NP/PA:   PCP: [name], No Pcp Per   [name] coming from:  The [name] is coming from home.  At baseline, pt is independent for most of ADL.   Chief Complaint: chest pain and cough  HPI: [name] is a 51 y.o. female without significant medical history, who presents with chest pain and cough.  [name] states that she started having chest pain at about 6 PM. The chest pain is located in the substernal area, 10 out of 10 in severity, sharp, nonradiating. It is not pleuritic. No tenderness in the calf areas. She also has diaphoresis and palpitation. She has cough with brownish colored sputum production recently. She states that her grandson has cold recently. No fever, chills, or SOB. [name] does not have nausea, vomiting, diarrhea, abdominal pain, symptoms of UTI or unilateral weakness.  ED Course: found to have A. fib with RVR, heart rate was up to 231. [name] was treated with 2 doses of adenosine, and started with Cardizem drip. She was converted to sinus rhythm with HR at 80 to 90s. Her chest pain has resolved. WBC 27.2, negative troponin, potassium 2.8, magnesium 1.8, sodium 133, creatinine normal, chest x-ray negative, oxygen saturation 96%. Pt is accepted to SDU for obs. EDP discussed with card fellow, who did not recommend cardioversion. Will need to call card again in AM.  Review of Systems:   General: no fevers, chills, no changes in body weight, has poor appetite, has fatigue HEENT: no blurry vision, hearing changes or sore throat Respiratory: no dyspnea, has coughing, no wheezing CV: has chest pain and palpitations GI: no nausea, vomiting, abdominal pain, diarrhea, constipation GU: no dysuria, burning on urination, increased urinary frequency, hematuria  Ext: no leg edema Neuro: no unilateral weakness, numbness, or tingling, no vision change or hearing loss Skin: no  rash, no skin tear. MSK: No muscle spasm, no deformity, no limitation of range of movement in spin Heme: No easy bruising.  Travel history: No recent long distant travel.  Allergy:  Allergies  Allergen Reactions  . Penicillins Shortness Of Breath and Swelling    Swells throat  . Bee Venom     History reviewed. No pertinent past medical history.  Past Surgical History:  Procedure Laterality Date  . ABDOMINAL HYSTERECTOMY    . KNEE SURGERY      Social History:  reports that she has never smoked. She has never used smokeless tobacco. She reports that she drinks alcohol. She reports that she does not use drugs.  Family History:  Family History  Problem Relation Age of Onset  . Diabetes Mellitus II Mother   . Heart failure Mother   . Heart attack Father   . Stroke Father      Prior to Admission medications   Not on File    Physical Exam: Vitals:   12/25/16 0032 12/25/16 0206 12/25/16 0400 12/25/16 0458  BP: 118/76 118/69 (!) 121/57 (!) 115/54  Pulse: 93 98 93 88  Resp: 18 19 19    Temp: 99.5 F (37.5 C) (!) 101.6 F (38.7 C) (!) 100.9 F (38.3 C)   TempSrc: Oral     SpO2: 97% 98% 96% 97%  Weight:      Height:       General: Not in acute distress HEENT:       Eyes: PERRL, EOMI, no scleral icterus.       ENT: No discharge from the  ears and nose, no pharynx injection, no tonsillar enlargement.        Neck: No JVD, no bruit, no mass felt. Heme: No neck lymph node enlargement. Cardiac: S1/S2, RRR, No murmurs, No gallops or rubs. Respiratory:  No rales, wheezing, rhonchi or rubs. GI: Soft, nondistended, nontender, no rebound pain, no organomegaly, BS present. GU: No hematuria Ext: No pitting leg edema bilaterally. 2+DP/PT pulse bilaterally. Musculoskeletal: No joint deformities, No joint redness or warmth, no limitation of ROM in spin. Skin: No rashes.  Neuro: Alert, oriented X3, cranial nerves II-XII grossly intact, moves all extremities normally.  Psych: [name]  is not psychotic, no suicidal or hemocidal ideation.  Labs on Admission: I have personally reviewed following labs and imaging studies  CBC:  Recent Labs Lab 12/24/16 1910  WBC 27.2*  NEUTROABS 25.3*  HGB 13.7  HCT 39.4  MCV 87.6  PLT 253   Basic Metabolic Panel:  Recent Labs Lab 12/24/16 1910  NA 133*  K 2.8*  CL 99*  CO2 23  GLUCOSE 166*  BUN 12  CREATININE 0.94  CALCIUM 8.8*  MG 1.8   GFR: Estimated Creatinine Clearance: 92.7 mL/min (by C-G formula based on SCr of 0.94 mg/dL). Liver Function Tests:  Recent Labs Lab 12/24/16 1910  AST 16  ALT 19  ALKPHOS 47  BILITOT 0.9  PROT 7.8  ALBUMIN 3.6   No results for input(s): LIPASE, AMYLASE in the last 168 hours. No results for input(s): AMMONIA in the last 168 hours. Coagulation Profile: No results for input(s): INR, PROTIME in the last 168 hours. Cardiac Enzymes:  Recent Labs Lab 12/24/16 1910 12/25/16 0229  TROPONINI <0.03 0.86*   BNP (last 3 results) No results for input(s): PROBNP in the last 8760 hours. HbA1C: No results for input(s): HGBA1C in the last 72 hours. CBG: No results for input(s): GLUCAP in the last 168 hours. Lipid Profile: No results for input(s): CHOL, HDL, LDLCALC, TRIG, CHOLHDL, LDLDIRECT in the last 72 hours. Thyroid Function Tests:  Recent Labs  12/25/16 0229  FREET4 0.83   Anemia Panel: No results for input(s): VITAMINB12, FOLATE, FERRITIN, TIBC, IRON, RETICCTPCT in the last 72 hours. Urine analysis:    Component Value Date/Time   COLORURINE AMBER (A) 12/25/2016 0105   APPEARANCEUR CLOUDY (A) 12/25/2016 0105   LABSPEC 1.025 12/25/2016 0105   PHURINE 5.0 12/25/2016 0105   GLUCOSEU NEGATIVE 12/25/2016 0105   HGBUR SMALL (A) 12/25/2016 0105   BILIRUBINUR NEGATIVE 12/25/2016 0105   KETONESUR 5 (A) 12/25/2016 0105   PROTEINUR 30 (A) 12/25/2016 0105   UROBILINOGEN 0.2 04/26/2011 1635   NITRITE NEGATIVE 12/25/2016 0105   LEUKOCYTESUR LARGE (A) 12/25/2016 0105    Sepsis Labs: @LABRCNTIP (procalcitonin:4,lacticidven:4) ) Recent Results (from the past 240 hour(s))  MRSA PCR Screening     Status: None   Collection Time: 12/25/16 12:40 AM  Result Value Ref Range Status   MRSA by PCR NEGATIVE NEGATIVE Final    Comment:        The GeneXpert MRSA Assay (FDA approved for NASAL specimens only), is one component of a comprehensive MRSA colonization surveillance program. It is not intended to diagnose MRSA infection nor to guide or monitor treatment for MRSA infections.      Radiological Exams on Admission: Dg Chest Port 1 View  Result Date: 12/24/2016 CLINICAL DATA:  Acute onset of chest pain earlier this evening. EXAM: PORTABLE CHEST 1 VIEW COMPARISON:  04/26/2011, 12 28,011 and earlier, including CTA chest 09/22/2006. FINDINGS: Cardiac silhouette mildly enlarged.  Hilar and mediastinal contours otherwise unremarkable. Lungs clear. Bronchovascular markings normal. Pulmonary vascularity normal. No visible pleural effusions. No pneumothorax. External pacing pads are present. IMPRESSION: Mild cardiomegaly.  No acute cardiopulmonary disease. Electronically Signed   By: Hulan Saashomas  Lawrence M.D.   On: 12/24/2016 20:20     EKG: Independently reviewed.  Atrial fibrillation with RVR, heart rate 231, QTC 422.  Assessment/Plan Principal Problem:   Chest pain Active Problems:   Atrial fibrillation with RVR (HCC)   Hypokalemia   Leukocytosis   Atrial fibrillation with rapid ventricular response (HCC)   Cough   Sepsis (HCC)   Elevated troponin   Chest pain: Most likely due to demand ischemia secondary to atrial fibrillation with RVR and sepsis. Initial troponin negative, but the repeat troponin is 0.83, currently [name] does not have chest pain.  - will place on SDU for obs - cycle CE q6 x3 and repeat EKG in the am  - prn Nitroglycerin, Morphine, and aspirin - start coreg 3.125 mg bid - Risk factor stratification: will check FLP, UDS and A1C  - 2d  echo - IV heparin was started for new onset A fib - please call Card in AM  Atrial Fibrillation with RVR: CHA2DS2-VASc Score is 1, not needs oral anticoagulation. Has converted to sinus rhythm now. HR is 80s to 90s. It is likely triggered by sepsis. EDP discussed with card fellow, who did not recommend cardioversion. Will need to call card again in AM. -on IV heparin -on Cardizem gtt - start coreg as above  Hypokalemia: K= 2.8 on admission. - Repleted - give 1 g of magnesium sulfate - Check Mg level  URI and sepsis: Pt has productive cough. CXR negative. Possibly due to bronchitis or upper respiratory viral infection given recent sick contact with her grandson. [name] meets criteria for sepsis with leukocytosis, fever of 101.6, tachypnea. Currently hemodynamically stable  - start Levaquin (pt received one dose of azithromycin) - Mucinex for cough  - Xopenex Neb prn for SOB - Urine S. pneumococcal antigen - Follow up blood culture x2, sputum culture and respiratory virus panel - will get Procalcitonin and trend lactic acid level per sepsis protocol - IVF: 3.5 L of NS bolus in ED, followed by 75 mL per hour of NS   DVT ppx: On IV heparin   Code Status: Full code Family Communication: None at bed side.   Disposition Plan:  Anticipate discharge back to previous home environment Consults called:  EDP discussed with card fellow, who did not recommend cardioversion. Will need to call card again in AM. Admission status: Obs / tele      Date of Service 12/25/2016    Lorretta HarpIU, Carri Spillers [facility] Pager [phone]  If 7PM-7AM, please contact night-coverage [email or web address] Password [email or web address] 12/25/2016, 5:22 AM

## 2016-12-25 NOTE — Progress Notes (Signed)
Patient ID: Sydney ChancellorMarguerita Meyer, female   DOB: 01-Aug-1965, 51 y.o.   MRN: [id number]                                                            [facility] on [facility]

## 2016-12-25 NOTE — Progress Notes (Signed)
Pharmacy Antibiotic Note Lulamae Manson Passeyngland is a 51 y.o. female admitted on 12/24/2016 with concern for bronchitis/sepsis. Pharmacy has been consulted for Levaquin dosing.  Plan: 1. Begin Levaquin 750 mg IV every 24 hours  2. Will follow peripherally   Height: 5\' 9"  (175.3 cm) Weight: 243 lb 6.4 oz (110.4 kg) IBW/kg (Calculated) : 66.2  Temp (24hrs), Avg:100 F (37.8 C), Min:98.8 F (37.1 C), Max:101.6 F (38.7 C)   Recent Labs Lab 12/24/16 1910 12/25/16 0230  WBC 27.2*  --   CREATININE 0.94  --   LATICACIDVEN  --  1.3    Estimated Creatinine Clearance: 92.7 mL/min (by C-G formula based on SCr of 0.94 mg/dL).    Allergies  Allergen Reactions  . Penicillins Shortness Of Breath and Swelling    Swells throat  . Bee Venom     Thank you for allowing pharmacy to be a part of this patient's care.  Pollyann SamplesAndy Musa Rewerts, PharmD, BCPS 12/25/2016, 4:11 AM

## 2016-12-25 NOTE — Consult Note (Addendum)
Chief Complaint/Reason for Consult: Atrial fibrillation RVR  Requesting Physician: Ivor Costa, MD  Primary Cardiologist: None  HPI: [name] is a 51 yo female with past medical history coming with sudden onset of chest pain last evening, around 6PM, associated with diaphoresis, dyspnea, palpitations. Came to OSH ED where was found to have atrial fibrillation with RVR. Received adenosine twice, cardizem bolus and drip. All these medications caused she slowed down first and then converted to sinus rhythm. Currently asymptomatic. Reports possible short episode of atrial fibrillation after hysterectomy 10 years ago. She denies cardiac history otherwise. Denies cardiac symptoms prior to this event. Reports father having MI and stroke in his 60's.   History reviewed. No pertinent past medical history.  Past Surgical History:  Procedure Laterality Date  . ABDOMINAL HYSTERECTOMY    . KNEE SURGERY      Family History  Problem Relation Age of Onset  . Diabetes Mellitus II Mother   . Heart failure Mother   . Heart attack Father   . Stroke Father    Social History:  reports that she has never smoked. She has never used smokeless tobacco. She reports that she drinks alcohol. She reports that she does not use drugs.  Allergies:  Allergies  Allergen Reactions  . Penicillins Shortness Of Breath and Swelling    Swells throat  . Bee Venom     No current facility-administered medications on file prior to encounter.    No current outpatient prescriptions on file prior to encounter.    Results for orders placed or performed during the hospital encounter of 12/24/16 (from the past 48 hour(s))  Comprehensive metabolic panel     Status: Abnormal   Collection Time: 12/24/16  7:10 PM  Result Value Ref Range   Sodium 133 (L) 135 - 145 mmol/L   Potassium 2.8 (L) 3.5 - 5.1 mmol/L   Chloride 99 (L) 101 - 111 mmol/L   CO2 23 22 - 32 mmol/L   Glucose, Bld 166 (H) 65 - 99 mg/dL   BUN 12 6 - 20  mg/dL   Creatinine, Ser 0.94 0.44 - 1.00 mg/dL   Calcium 8.8 (L) 8.9 - 10.3 mg/dL   Total Protein 7.8 6.5 - 8.1 g/dL   Albumin 3.6 3.5 - 5.0 g/dL   AST 16 15 - 41 U/L   ALT 19 14 - 54 U/L   Alkaline Phosphatase 47 38 - 126 U/L   Total Bilirubin 0.9 0.3 - 1.2 mg/dL   GFR calc non Af Amer >60 >60 mL/min   GFR calc Af Amer >60 >60 mL/min    Comment: (NOTE) The eGFR has been calculated using the CKD EPI equation. This calculation has not been validated in all clinical situations. eGFR's persistently <60 mL/min signify possible Chronic Kidney Disease.    Anion gap 11 5 - 15  CBC with Differential     Status: Abnormal   Collection Time: 12/24/16  7:10 PM  Result Value Ref Range   WBC 27.2 (H) 4.0 - 10.5 K/uL   RBC 4.50 3.87 - 5.11 MIL/uL   Hemoglobin 13.7 12.0 - 15.0 g/dL   HCT 39.4 36.0 - 46.0 %   MCV 87.6 78.0 - 100.0 fL   MCH 30.4 26.0 - 34.0 pg   MCHC 34.8 30.0 - 36.0 g/dL   RDW 13.4 11.5 - 15.5 %   Platelets 253 150 - 400 K/uL   Neutrophils Relative % 77 %   Lymphocytes Relative 2 %  Monocytes Relative 5 %   Eosinophils Relative 0 %   Basophils Relative 0 %   Band Neutrophils 15 %   Myelocytes 1 %   Neutro Abs 25.3 (H) 1.7 - 7.7 K/uL   Lymphs Abs 0.5 (L) 0.7 - 4.0 K/uL   Monocytes Absolute 1.4 (H) 0.1 - 1.0 K/uL   Eosinophils Absolute 0.0 0.0 - 0.7 K/uL   Basophils Absolute 0.0 0.0 - 0.1 K/uL   WBC Morphology VACUOLATED NEUTROPHILS   Troponin I     Status: None   Collection Time: 12/24/16  7:10 PM  Result Value Ref Range   Troponin I <0.03 <0.03 ng/mL  Magnesium     Status: None   Collection Time: 12/24/16  7:10 PM  Result Value Ref Range   Magnesium 1.8 1.7 - 2.4 mg/dL  MRSA PCR Screening     Status: None   Collection Time: 12/25/16 12:40 AM  Result Value Ref Range   MRSA by PCR NEGATIVE NEGATIVE    Comment:        The GeneXpert MRSA Assay (FDA approved for NASAL specimens only), is one component of a comprehensive MRSA colonization surveillance program.  It is not intended to diagnose MRSA infection nor to guide or monitor treatment for MRSA infections.   Rapid urine drug screen (hospital performed)     Status: None   Collection Time: 12/25/16  1:05 AM  Result Value Ref Range   Opiates NONE DETECTED NONE DETECTED   Cocaine NONE DETECTED NONE DETECTED   Benzodiazepines NONE DETECTED NONE DETECTED   Amphetamines NONE DETECTED NONE DETECTED   Tetrahydrocannabinol NONE DETECTED NONE DETECTED   Barbiturates NONE DETECTED NONE DETECTED    Comment:        DRUG SCREEN FOR MEDICAL PURPOSES ONLY.  IF CONFIRMATION IS NEEDED FOR ANY PURPOSE, NOTIFY LAB WITHIN 5 DAYS.        LOWEST DETECTABLE LIMITS FOR URINE DRUG SCREEN Drug Class       Cutoff (ng/mL) Amphetamine      1000 Barbiturate      200 Benzodiazepine   357 Tricyclics       017 Opiates          300 Cocaine          300 THC              50   Urinalysis, Routine w reflex microscopic     Status: Abnormal   Collection Time: 12/25/16  1:05 AM  Result Value Ref Range   Color, Urine AMBER (A) YELLOW    Comment: BIOCHEMICALS MAY BE AFFECTED BY COLOR   APPearance CLOUDY (A) CLEAR   Specific Gravity, Urine 1.025 1.005 - 1.030   pH 5.0 5.0 - 8.0   Glucose, UA NEGATIVE NEGATIVE mg/dL   Hgb urine dipstick SMALL (A) NEGATIVE   Bilirubin Urine NEGATIVE NEGATIVE   Ketones, ur 5 (A) NEGATIVE mg/dL   Protein, ur 30 (A) NEGATIVE mg/dL   Nitrite NEGATIVE NEGATIVE   Leukocytes, UA LARGE (A) NEGATIVE   RBC / HPF 6-30 0 - 5 RBC/hpf   WBC, UA TOO NUMEROUS TO COUNT 0 - 5 WBC/hpf   Bacteria, UA RARE (A) NONE SEEN   Squamous Epithelial / LPF 6-30 (A) NONE SEEN   Mucous PRESENT   T4, free     Status: None   Collection Time: 12/25/16  2:29 AM  Result Value Ref Range   Free T4 0.83 0.61 - 1.12 ng/dL    Comment: (NOTE) Biotin  ingestion may interfere with free T4 tests. If the results are inconsistent with the TSH level, previous test results, or the clinical presentation, then consider biotin  interference. If needed, order repeat testing after stopping biotin.   Troponin I (q 6hr x 3)     Status: Abnormal   Collection Time: 12/25/16  2:29 AM  Result Value Ref Range   Troponin I 0.86 (HH) <0.03 ng/mL    Comment: CRITICAL RESULT CALLED TO, READ BACK BY AND VERIFIED WITH: KUZMICK B,RN 12/25/16 0332 WAYK   Procalcitonin     Status: None   Collection Time: 12/25/16  2:29 AM  Result Value Ref Range   Procalcitonin 0.19 ng/mL    Comment:        Interpretation: PCT (Procalcitonin) <= 0.5 ng/mL: Systemic infection (sepsis) is not likely. Local bacterial infection is possible. (NOTE)         ICU PCT Algorithm               Non ICU PCT Algorithm    ----------------------------     ------------------------------         PCT < 0.25 ng/mL                 PCT < 0.1 ng/mL     Stopping of antibiotics            Stopping of antibiotics       strongly encouraged.               strongly encouraged.    ----------------------------     ------------------------------       PCT level decrease by               PCT < 0.25 ng/mL       >= 80% from peak PCT       OR PCT 0.25 - 0.5 ng/mL          Stopping of antibiotics                                             encouraged.     Stopping of antibiotics           encouraged.    ----------------------------     ------------------------------       PCT level decrease by              PCT >= 0.25 ng/mL       < 80% from peak PCT        AND PCT >= 0.5 ng/mL            Continuin g antibiotics                                              encouraged.       Continuing antibiotics            encouraged.    ----------------------------     ------------------------------     PCT level increase compared          PCT > 0.5 ng/mL         with peak PCT AND          PCT >= 0.5 ng/mL  Escalation of antibiotics                                          strongly encouraged.      Escalation of antibiotics        strongly encouraged.   Lactic acid,  plasma     Status: None   Collection Time: 12/25/16  2:30 AM  Result Value Ref Range   Lactic Acid, Venous 1.3 0.5 - 1.9 mmol/L  Lipid panel     Status: Abnormal   Collection Time: 12/25/16  4:45 AM  Result Value Ref Range   Cholesterol 158 0 - 200 mg/dL   Triglycerides 158 (H) <150 mg/dL   HDL 33 (L) >40 mg/dL   Total CHOL/HDL Ratio 4.8 RATIO   VLDL 32 0 - 40 mg/dL   LDL Cholesterol 93 0 - 99 mg/dL    Comment:        Total Cholesterol/HDL:CHD Risk Coronary Heart Disease Risk Table                     Men   Women  1/2 Average Risk   3.4   3.3  Average Risk       5.0   4.4  2 X Average Risk   9.6   7.1  3 X Average Risk  23.4   11.0        Use the calculated Patient Ratio above and the CHD Risk Table to determine the patient's CHD Risk.        ATP III CLASSIFICATION (LDL):  <100     mg/dL   Optimal  100-129  mg/dL   Near or Above                    Optimal  130-159  mg/dL   Borderline  160-189  mg/dL   High  >190     mg/dL   Very High   Lactic acid, plasma     Status: None   Collection Time: 12/25/16  4:45 AM  Result Value Ref Range   Lactic Acid, Venous 0.8 0.5 - 1.9 mmol/L  Brain natriuretic peptide     Status: Abnormal   Collection Time: 12/25/16  4:45 AM  Result Value Ref Range   B Natriuretic Peptide 130.6 (H) 0.0 - 100.0 pg/mL   Dg Chest Port 1 View  Result Date: 12/24/2016 CLINICAL DATA:  Acute onset of chest pain earlier this evening. EXAM: PORTABLE CHEST 1 VIEW COMPARISON:  04/26/2011, 12 28,011 and earlier, including CTA chest 09/22/2006. FINDINGS: Cardiac silhouette mildly enlarged. Hilar and mediastinal contours otherwise unremarkable. Lungs clear. Bronchovascular markings normal. Pulmonary vascularity normal. No visible pleural effusions. No pneumothorax. External pacing pads are present. IMPRESSION: Mild cardiomegaly.  No acute cardiopulmonary disease. Electronically Signed   By: Evangeline Dakin M.D.   On: 12/24/2016 20:20    ECG/TELE: NSR 80 bpm,  possible delta wave and WPW  ROS: As above. Otherwise, review of systems is negative unless per above HPI  Vitals:   12/25/16 0032 12/25/16 0206 12/25/16 0400 12/25/16 0458  BP: 118/76 118/69 (!) 121/57 (!) 115/54  Pulse: 93 98 93 88  Resp: _0 Temp: 99.5 F (37.5 C) (!) 101.6 F (38.7 C) (!) 100.9 F (38.3 C)   TempSrc: Oral     SpO2: 97% 98% 96% 97%  Weight:  Height:       Wt Readings from Last 10 Encounters:  12/25/16 110.4 kg (243 lb 6.4 oz)  04/26/11 113.4 kg (250 lb)    PE:  General: No acute distress HEENT: Atraumatic, EOMI, mucous membranes moist CV: RRR no murmurs, gallops. No JVD. No HJR. Respiratory: Clear, no crackles. Normal work of breathing ABD: Non-distended and non-tender. No palpable organomegaly.  Extremities: 2+ radial pulses bilaterally. No edema. Neuro/Psych: CN grossly intact, alert and oriented  Assessment/Plan 1. 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 Now in sinus rhythm, asymptomatic. ECGs suspicious for presence of delta wave and WPW. All in the setting of ongoing infection, likely UTI or URI, and hypokalemia with potassium 2.8. Elevated cardiac enzymes likely from demand ischemia.   - Cycle enzymes, lipid panel, TSH - Echocardiogram - ASA 325 mg once and 81 mg daily for now - No anticoagulation, CHA2DS2-VASc 1 - Would hold heparin, as ACS very unlikely - Keep K>4.0, Mg>2.0 - EP consult for possible WPW, until then would avoid AV nodal blocking agents  Szymon Adela Lank  MD 12/25/2016, 5:32 AM

## 2016-12-25 NOTE — Consult Note (Signed)
Cardiology Consultation:   Patient ID: [name]; [id number]; 1965/10/25   Admit date: 12/24/2016 Date of Consult: 12/25/2016  Primary Care Provider: Patient, No Pcp Per Primary Cardiologist: new to [facility] Primary Electrophysiologist:  New to [facility], Dr. Ladona Ridgel   Patient Profile:   [name] is a 51 y.o. female with no known PMHx who is being seen today for the evaluation of Rapid AFib, WPW at the request of [place].  History of Present Illness:   [name] was admitted via the ER yesterday with c/o heart racing and significant weakness.  She has been mildly ill catching a cold from her grandkids, with cough/cold type illness, no subjective fever has been using Alka seltzer Plus for symptom relief.  Yesterday she had made herself some soup, and was about to eat when she felt her heart start to race, "felt like it was going to come out of my chest." and very weak, no syncope.  No overt CP but the racing/pounding very uncomfortable.    ER: Adenosine 6 then 12 and a dilt gtt in ER, and started on heparin gtt   LABS K+ 2.8 s/p replacement BUN/Creat 12/0.94 Mag 1.8 Trop I: <0.03, 0.86, 0.55 BNP 130 WBC 27.2 (with temp 101.6 early this AM, abn UA) Plts 253 TSH 1.578  History reviewed. No pertinent past medical history.  Past Surgical History:  Procedure Laterality Date  . ABDOMINAL HYSTERECTOMY    . KNEE SURGERY       Inpatient Medications: Scheduled Meds: . aspirin EC  325 mg Oral Daily  . dextromethorphan-guaiFENesin  1 tablet Oral BID  . diltiazem  15 mg Intravenous Once   Continuous Infusions: . sodium chloride 75 mL/hr at 12/25/16 1200  . heparin 1,300 Units/hr (12/25/16 1200)  . levofloxacin (LEVAQUIN) IV     PRN Meds: acetaminophen, ALPRAZolam, levalbuterol, morphine injection, nitroGLYCERIN, ondansetron (ZOFRAN) IV, zolpidem  Allergies:    Allergies  Allergen Reactions  . Penicillins Shortness Of Breath, Swelling and Other (See Comments)    Swells throat  . Bee Venom Swelling and Other (See Comments)    Hard spots    Social History:   Social History   Social History  . Marital status: Single    Spouse name: N/A  . Number of children: N/A  . Years of education: N/A   Occupational History  . Not on file.   Social History Main Topics  . Smoking status: Never Smoker  . Smokeless tobacco: Never Used  . Alcohol use Yes     Comment: occ  . Drug use: No  . Sexual activity: Not on file   Other Topics Concern  . Not on file   Social History Narrative  . No narrative on file    Family History:   The patient's family history includes Diabetes Mellitus II in her mother; Heart attack in her father; Heart failure in her mother; Stroke in her father.  ROS:  Please see the history of present illness.  ROS  All other ROS reviewed and negative.     Physical Exam/Data:   Vitals:   12/25/16 0400 12/25/16 0458 12/25/16 0827 12/25/16 1201  BP: (!) 121/57 (!) 115/54 128/73 (!) 145/79  Pulse: 93 88 90 95  Resp: 19   (!) 21  Temp: (!) 100.9 F (38.3 C)  99 F (37.2 C)   TempSrc:   Oral Oral  SpO2: 96% 97% 97% 93%  Weight:      Height:  Intake/Output Summary (Last 24 hours) at 12/25/16 1321 Last data filed at 12/25/16 1201  Gross per 24 hour  Intake           910.87 ml  Output              600 ml  Net           310.87 ml   Filed Weights   12/24/16 2124 12/25/16 0000  Weight: 238 lb 15.7 oz (108.4 kg) 243 lb 6.4 oz (110.4 kg)   Body mass index is 35.94 kg/m.  General:  Well nourished, well developed, in no acute distress HEENT: normal Lymph: no adenopathy Neck: no JVD Endocrine:  No thryomegaly Vascular: No carotid bruits Cardiac:  normal S1, S2; RRR; no murmur Lungs:  clear to auscultation bilaterally, no wheezing, rhonchi or rales  Abd: soft, nontender, obese Ext: no edema Musculoskeletal:  No deformities, BUE and BLE strength normal and equal Skin: warm and dry  Neuro:  no gross focal  abnormalities noted Psych:  Normal affect   EKG:  The EKG's were personally reviewed and demonstrates:   #1 rapid AFib, 231bpm, diffuse ST/T changes #2 rapid AFib, 148bpm, persistent though less pronounced ST/T changes #3 is SR, PR , QRS 96ms, QTc 420, resolved ST/T changes, lead V2 w delta wave Telemetry:  Telemetry was personally reviewed and demonstrates:  SR  Relevant CV Studies: No historical cardiac data Echo is ordered and is pending  Laboratory Data:  Chemistry Recent Labs Lab 12/24/16 1910  NA 133*  K 2.8*  CL 99*  CO2 23  GLUCOSE 166*  BUN 12  CREATININE 0.94  CALCIUM 8.8*  GFRNONAA >60  GFRAA >60  ANIONGAP 11     Recent Labs Lab 12/24/16 1910  PROT 7.8  ALBUMIN 3.6  AST 16  ALT 19  ALKPHOS 47  BILITOT 0.9   Hematology Recent Labs Lab 12/24/16 1910  WBC 27.2*  RBC 4.50  HGB 13.7  HCT 39.4  MCV 87.6  MCH 30.4  MCHC 34.8  RDW 13.4  PLT 253   Cardiac Enzymes Recent Labs Lab 12/24/16 1910 12/25/16 0229 12/25/16 0903  TROPONINI <0.03 0.86* 0.55*   No results for input(s): TROPIPOC in the last 168 hours.  BNP Recent Labs Lab 12/25/16 0445  BNP 130.6*     Radiology/Studies:  Dg Chest Port 1 View Result Date: 12/24/2016 CLINICAL DATA:  Acute onset of chest pain earlier this evening. EXAM: PORTABLE CHEST 1 VIEW COMPARISON:  04/26/2011, 12 28,011 and earlier, including CTA chest 09/22/2006. FINDINGS: Cardiac silhouette mildly enlarged. Hilar and mediastinal contours otherwise unremarkable. Lungs clear. Bronchovascular markings normal. Pulmonary vascularity normal. No visible pleural effusions. No pneumothorax. External pacing pads are present. IMPRESSION: Mild cardiomegaly.  No acute cardiopulmonary disease. Electronically Signed   By: Hulan Saas M.D.   On: 12/24/2016 20:20    Assessment and Plan:   1. Rapid AFib     CHA2DS2Vasc is one for female gender, no need for anticoagulation The patient seems to recall after her  hysterectomy in 2008 complicated by a bowel perforation being told she had an AFib episode.   2. WPW     V2 has delta wave, will review EKG with Dr. Ladona Ridgel     no hx of syncope     patient admited with sepsis     Likely will plan for out patient f/u   3. Abnormal Troponin trending down     Card service did not suspect ACS  Likely demand ischemia given RVR, no c/o CP, no ischemic changes on her EKG in SR     Echo is pending  Dr. Ladona Ridgelaylor to see the patient  3. Febrile illness     Marked leukocytosis     C/w medicine service  4. Hypokalemia     Has been replaced pending f/u lab     C/w medicine service   Signed, Sheilah PigeonRenee Lynn Ursuy, PA-C  12/25/2016 1:21 PM   EP Attending  Patient seen and examined. Agree with the findings as noted above. The patient is a pleasant 51 yo woman who has no h/o WPW who presented after an acute febrile illness with atrial fib with a RVR. She has evidence of probable ventricular pre-excitation during the atrial fib, and a very short PR interval in NSR. She feels better and her temperature has improved. No syncope. She has not had any clear cut h/o sustained atrial arrhythmias. There may have been atrial fib 10 years ago during surgery. Her exam is notable for a very large woman, NAD and clear lungs and CV reveals a RRR. Abdomen is obese, soft. extremities reveal very large legs but no obvious edema. ECG reveals atrial fib with a very rapid VR, and NSR with a short PR.   A/P 1. Atrial fib - she will need anti-coagulation as her CHADSVASC score is 1 but her CBG is elevated and I suspect she has DM.  2. Hyperglycemia - would suggest a hgbA1C. If elevated she would need treatment.  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 I will offer her an ablation. This can be done as an outpatient if she has no more rapid atrial fib.   Leonia ReevesGregg Telma Pyeatt,M.D.

## 2016-12-26 ENCOUNTER — Encounter (HOSPITAL_COMMUNITY): Payer: Self-pay | Admitting: General Practice

## 2016-12-26 ENCOUNTER — Inpatient Hospital Stay (HOSPITAL_COMMUNITY): Payer: Self-pay

## 2016-12-26 DIAGNOSIS — R079 Chest pain, unspecified: Secondary | ICD-10-CM

## 2016-12-26 DIAGNOSIS — I456 Pre-excitation syndrome: Secondary | ICD-10-CM

## 2016-12-26 DIAGNOSIS — E876 Hypokalemia: Secondary | ICD-10-CM

## 2016-12-26 DIAGNOSIS — I4891 Unspecified atrial fibrillation: Secondary | ICD-10-CM

## 2016-12-26 DIAGNOSIS — R748 Abnormal levels of other serum enzymes: Secondary | ICD-10-CM

## 2016-12-26 HISTORY — DX: Pre-excitation syndrome: I45.6

## 2016-12-26 HISTORY — DX: Unspecified atrial fibrillation: I48.91

## 2016-12-26 LAB — ECHOCARDIOGRAM COMPLETE
Area-P 1/2: 3.79 cm2
CHL CUP RV SYS PRESS: 35 mmHg
EERAT: 8.08
EWDT: 197 ms
FS: 24 % — AB (ref 28–44)
Height: 69 in
IVS/LV PW RATIO, ED: 1
LA vol: 49.6 mL
LADIAMINDEX: 1.56 cm/m2
LASIZE: 35 mm
LAVOLA4C: 42.1 mL
LAVOLIN: 22 mL/m2
LDCA: 3.14 cm2
LEFT ATRIUM END SYS DIAM: 35 mm
LV TDI E'LATERAL: 8.59
LV TDI E'MEDIAL: 7.72
LVEEAVG: 8.08
LVEEMED: 8.08
LVELAT: 8.59 cm/s
LVOT VTI: 17.5 cm
LVOT diameter: 20 mm
LVOT peak grad rest: 4 mmHg
LVOTPV: 96.8 cm/s
LVOTSV: 55 mL
Lateral S' vel: 10.6 cm/s
MV Dec: 197
MV pk A vel: 47.5 m/s
MVPKEVEL: 69.4 m/s
P 1/2 time: 58 ms
PW: 11 mm — AB (ref 0.6–1.1)
RV TAPSE: 23.5 mm
Reg peak vel: 285 cm/s
TRMAXVEL: 285 cm/s
Weight: 3894.4 oz

## 2016-12-26 LAB — RESPIRATORY PANEL BY PCR
ADENOVIRUS-RVPPCR: NOT DETECTED
Bordetella pertussis: NOT DETECTED
CHLAMYDOPHILA PNEUMONIAE-RVPPCR: NOT DETECTED
CORONAVIRUS 229E-RVPPCR: NOT DETECTED
CORONAVIRUS HKU1-RVPPCR: NOT DETECTED
CORONAVIRUS NL63-RVPPCR: NOT DETECTED
Coronavirus OC43: NOT DETECTED
Influenza A: NOT DETECTED
Influenza B: NOT DETECTED
MYCOPLASMA PNEUMONIAE-RVPPCR: NOT DETECTED
Metapneumovirus: NOT DETECTED
PARAINFLUENZA VIRUS 3-RVPPCR: NOT DETECTED
Parainfluenza Virus 1: NOT DETECTED
Parainfluenza Virus 2: NOT DETECTED
Parainfluenza Virus 4: NOT DETECTED
RHINOVIRUS / ENTEROVIRUS - RVPPCR: NOT DETECTED
Respiratory Syncytial Virus: NOT DETECTED

## 2016-12-26 LAB — BASIC METABOLIC PANEL
ANION GAP: 8 (ref 5–15)
BUN: 5 mg/dL — AB (ref 6–20)
CALCIUM: 8.4 mg/dL — AB (ref 8.9–10.3)
CO2: 27 mmol/L (ref 22–32)
Chloride: 103 mmol/L (ref 101–111)
Creatinine, Ser: 0.56 mg/dL (ref 0.44–1.00)
GFR calc Af Amer: >60 mL/min (ref >60)
GFR calc non Af Amer: >60 mL/min (ref >60)
GLUCOSE: 106 mg/dL — AB (ref 65–99)
Potassium: 3.2 mmol/L — ABNORMAL LOW (ref 3.5–5.1)
Sodium: 138 mmol/L (ref 135–145)

## 2016-12-26 LAB — HEMOGLOBIN A1C
Hgb A1c MFr Bld: 5.4 % (ref 4.8–5.6)
MEAN PLASMA GLUCOSE: 108 mg/dL

## 2016-12-26 LAB — T3, FREE: T3, Free: 2.1 pg/mL (ref 2.0–4.4)

## 2016-12-26 LAB — MAGNESIUM: Magnesium: 2.1 mg/dL (ref 1.7–2.4)

## 2016-12-26 MED ORDER — POTASSIUM CHLORIDE CRYS ER 20 MEQ PO TBCR: 20.0000 meq | EXTENDED_RELEASE_TABLET | Freq: Two times a day (BID) | ORAL | Status: DC

## 2016-12-26 MED ORDER — POTASSIUM CHLORIDE CRYS ER 20 MEQ PO TBCR
40.0000 meq | EXTENDED_RELEASE_TABLET | Freq: Two times a day (BID) | ORAL | Status: AC
  Administered 2016-12-26 (×2): 40 meq via ORAL
  Filled 2016-12-26 (×3): qty 2

## 2016-12-26 MED ORDER — METOPROLOL TARTRATE 50 MG PO TABS: 50.0000 mg | ORAL_TABLET | Freq: Two times a day (BID) | ORAL | Status: DC

## 2016-12-26 MED ORDER — METOPROLOL TARTRATE 50 MG PO TABS
50.0000 mg | ORAL_TABLET | Freq: Two times a day (BID) | ORAL | Status: DC
  Administered 2016-12-26 – 2016-12-28 (×5): 50 mg via ORAL
  Filled 2016-12-26 (×5): qty 1

## 2016-12-26 MED ORDER — FLECAINIDE ACETATE 100 MG PO TABS
100.0000 mg | ORAL_TABLET | Freq: Once | ORAL | Status: AC
  Administered 2016-12-26: 100 mg via ORAL
  Filled 2016-12-26: qty 1

## 2016-12-26 MED ORDER — FLECAINIDE ACETATE 100 MG PO TABS
100.0000 mg | ORAL_TABLET | Freq: Two times a day (BID) | ORAL | Status: DC
  Administered 2016-12-26 – 2016-12-28 (×4): 100 mg via ORAL
  Filled 2016-12-26 (×4): qty 1

## 2016-12-26 MED ORDER — AMIODARONE HCL IN DEXTROSE 360-4.14 MG/200ML-% IV SOLN
60.0000 mg/h | INTRAVENOUS | Status: DC
  Administered 2016-12-26 (×2): 60 mg/h via INTRAVENOUS
  Filled 2016-12-26: qty 200

## 2016-12-26 MED ORDER — AMIODARONE HCL IN DEXTROSE 360-4.14 MG/200ML-% IV SOLN: 30.0000 mg/h | INTRAVENOUS | Status: DC

## 2016-12-26 MED ORDER — AMIODARONE HCL IN DEXTROSE 360-4.14 MG/200ML-% IV SOLN
INTRAVENOUS | Status: AC
  Filled 2016-12-26: qty 200

## 2016-12-26 MED ORDER — FLECAINIDE ACETATE 50 MG PO TABS
75.0000 mg | ORAL_TABLET | Freq: Two times a day (BID) | ORAL | Status: DC
  Administered 2016-12-26: 75 mg via ORAL
  Filled 2016-12-26: qty 2

## 2016-12-26 MED ORDER — OFF THE BEAT BOOK
Freq: Once | Status: AC
  Administered 2016-12-26: 14:00:00
  Filled 2016-12-26: qty 1

## 2016-12-26 NOTE — Progress Notes (Signed)
Pt  Flipped into SVT w/ HR jumping into 150's per tele. Notified Dr. Shirlee LatchMclean. Amio gtt started. BP and other VS stable at this time. Having some CP 5/10 reports "better than when I came in". Will continue to monitor on gtt.

## 2016-12-26 NOTE — Progress Notes (Signed)
Patient ID: [name], female   DOB: 1966-01-03, 51 y.o.   MRN: [id number]                                                                 PROGRESS NOTE                                                                                                                                                                                                             Patient Demographics:    [name], is a 51 y.o. female, DOB - 05-21-1966, NWG:[id number]  Admit date - 12/24/2016   Admitting Physician Lorretta Harp, MD  Outpatient Primary MD for the patient is Patient, No Pcp Per  LOS - 1  Outpatient Specialists:  Chief Complaint  Patient presents with  . Chest Pain       Brief Narrative   51 y.o. female without significant medical history, who presents with chest pain and cough.  Patient states that she started having chest pain at about 6 PM. The chest pain is located in the substernal area, 10 out of 10 in severity, sharp, nonradiating. It is not pleuritic. No tenderness in the calf areas. She also has diaphoresis and palpitation. She has cough with brownish colored sputum production recently. She states that her grandson has cold recently. No fever, chills, or SOB. Patient does not have nausea, vomiting, diarrhea, abdominal pain, symptoms of UTI or unilateral weakness.  ED Course: found to have A. fib with RVR, heart rate was up to 231. Patient was treated with 2 doses of adenosine, and started with Cardizem drip. She was converted to sinus rhythm with HR at 80 to 90s. Her chest pain has resolved. WBC 27.2, negative troponin, potassium 2.8, magnesium 1.8, sodium 133, creatinine normal, chest x-ray negative, oxygen saturation 96%. Pt is accepted to SDU for obs. EDP discussed with card fellow, who did not recommend cardioversion. Will need to call card again in AM.   Subjective:    [name]  Has been afebrile.  D/c heparin yesterday due to low chadsvasc2 score.  Pt  apparently had afib with rvr and on amiodarone gtt. Pt had cp, palp w elevation in HR>   Currently   No headache, No chest pain, No abdominal pain - No Nausea, No new weakness tingling  or numbness, No Cough - SOB.    Assessment  & Plan :    Principal Problem:   Chest pain Active Problems:   Atrial fibrillation with RVR (HCC)   Hypokalemia   Leukocytosis   Atrial fibrillation with rapid ventricular response (HCC)   Cough   Sepsis (HCC)   Elevated troponin   WPW (Wolff-Parkinson-White syndrome)      Chest pain: Most likely due to demand ischemia secondary to atrial fibrillation with RVR and sepsis. Initial troponin negative, but the repeat troponin is 0.83, currently patient does not have chest pain.  - defer to cardiology  Atrial Fibrillation with RVR:/ WPW  CHA2DS2-VASc Score is 1, not needs oral anticoagulation. Has converted to sinus rhythm now. HR is 80s to 90s. It is likely triggered by sepsis. EDP discussed with card fellow, who did not recommend cardioversion Cardiology following , appreciate input  Hypokalemia: K= 2.8 on admission. - Replete, cmp tomorrow am   URI/UTI and sepsis: Pt has productive cough. CXR negative. Possibly due to bronchitis or upper respiratory viral infection given recent sick contact with her grandson. Patient meets criteria for sepsis with leukocytosis, fever of 101.6, tachypnea. Currently hemodynamically stable Continue levaquin D2  DVT ppx:  lovenox  Code Status: Full code Family Communication: None at bed side.   Disposition Plan:  Anticipate discharge back to previous home environment Consults called:  EDP discussed with card fellow, who did not recommend cardioversion. Will need to call card again in AM. Admission status: Obs / tele     Lab Results  Component Value Date   PLT 253 12/24/2016    Antibiotics  :  Levaquin  Anti-infectives    Start     Dose/Rate Route Frequency Ordered Stop   12/26/16 1000  azithromycin  (ZITHROMAX) tablet 250 mg  Status:  Discontinued     250 mg Oral Daily 12/25/16 0105 12/25/16 0406   12/25/16 2200  levofloxacin (LEVAQUIN) IVPB 750 mg     750 mg 100 mL/hr over 90 Minutes Intravenous Every 24 hours 12/25/16 0412     12/25/16 0500  levofloxacin (LEVAQUIN) IVPB 750 mg  Status:  Discontinued     750 mg 100 mL/hr over 90 Minutes Intravenous Every 24 hours 12/25/16 0409 12/25/16 0412   12/25/16 0115  azithromycin (ZITHROMAX) tablet 500 mg     500 mg Oral Daily 12/25/16 0105 12/25/16 0213        Objective:   Vitals:   12/26/16 0200 12/26/16 0230 12/26/16 0300 12/26/16 0330  BP: 127/73 (!) 149/96 (!) 145/87 (!) 151/75  Pulse: 93 (!) 142 91   Resp: 17 20 19 18   Temp:    98.7 F (37.1 C)  TempSrc:    Oral  SpO2: 95% 95% 94% 94%  Weight:      Height:        Wt Readings from Last 3 Encounters:  12/25/16 110.4 kg (243 lb 6.4 oz)  04/26/11 113.4 kg (250 lb)     Intake/Output Summary (Last 24 hours) at 12/26/16 0706 Last data filed at 12/26/16 0300  Gross per 24 hour  Intake          2481.37 ml  Output             2150 ml  Net           331.37 ml     Physical Exam  Awake Alert, Oriented X 3, No new F.N deficits, Normal affect [place].AT,PERRAL Supple Neck,No JVD, No cervical lymphadenopathy  appriciated.  Symmetrical Chest wall movement, Good air movement bilaterally, CTAB RRR,No Gallops,Rubs or new Murmurs, No Parasternal Heave +ve B.Sounds, Abd Soft, No tenderness, No organomegaly appriciated, No rebound - guarding or rigidity. No Cyanosis, Clubbing or edema, No new Rash or bruise      Data Review:    CBC  Recent Labs Lab 12/24/16 1910  WBC 27.2*  HGB 13.7  HCT 39.4  PLT 253  MCV 87.6  MCH 30.4  MCHC 34.8  RDW 13.4  LYMPHSABS 0.5*  MONOABS 1.4*  EOSABS 0.0  BASOSABS 0.0    Chemistries   Recent Labs Lab 12/24/16 1910 12/26/16 0221  NA 133* 138  K 2.8* 3.2*  CL 99* 103  CO2 23 27  GLUCOSE 166* 106*  BUN 12 5*  CREATININE 0.94 0.56    CALCIUM 8.8* 8.4*  MG 1.8  --   AST 16  --   ALT 19  --   ALKPHOS 47  --   BILITOT 0.9  --    ------------------------------------------------------------------------------------------------------------------  Recent Labs  12/25/16 0445  CHOL 158  HDL 33*  LDLCALC 93  TRIG 161*  CHOLHDL 4.8    Lab Results  Component Value Date   HGBA1C 5.4 12/25/2016   ------------------------------------------------------------------------------------------------------------------  Recent Labs  12/25/16 0445  TSH 1.578  T3FREE 2.1   ------------------------------------------------------------------------------------------------------------------ No results for input(s): VITAMINB12, FOLATE, FERRITIN, TIBC, IRON, RETICCTPCT in the last 72 hours.  Coagulation profile No results for input(s): INR, PROTIME in the last 168 hours.  No results for input(s): DDIMER in the last 72 hours.  Cardiac Enzymes  Recent Labs Lab 12/25/16 0229 12/25/16 0903 12/25/16 1240  TROPONINI 0.86* 0.55* 0.49*   ------------------------------------------------------------------------------------------------------------------    Component Value Date/Time   BNP 130.6 (H) 12/25/2016 0445    Inpatient Medications  Scheduled Meds: . aspirin EC  325 mg Oral Daily  . dextromethorphan-guaiFENesin  1 tablet Oral BID  . diltiazem  15 mg Intravenous Once   Continuous Infusions: . sodium chloride 75 mL/hr at 12/25/16 1200  . amiodarone 60 mg/hr (12/26/16 0600)   Followed by  . amiodarone    . levofloxacin (LEVAQUIN) IV Stopped (12/25/16 2345)   PRN Meds:.acetaminophen, ALPRAZolam, levalbuterol, morphine injection, nitroGLYCERIN, ondansetron (ZOFRAN) IV, zolpidem  Micro Results Recent Results (from the past 240 hour(s))  MRSA PCR Screening     Status: None   Collection Time: 12/25/16 12:40 AM  Result Value Ref Range Status   MRSA by PCR NEGATIVE NEGATIVE Final    Comment:        The GeneXpert  MRSA Assay (FDA approved for NASAL specimens only), is one component of a comprehensive MRSA colonization surveillance program. It is not intended to diagnose MRSA infection nor to guide or monitor treatment for MRSA infections.   Culture, expectorated sputum-assessment     Status: None   Collection Time: 12/25/16  1:09 AM  Result Value Ref Range Status   Specimen Description EXPECTORATED SPUTUM  Final   Special Requests NONE  Final   Sputum evaluation THIS SPECIMEN IS ACCEPTABLE FOR SPUTUM CULTURE  Final   Report Status 12/25/2016 FINAL  Final  Culture, respiratory (NON-Expectorated)     Status: None (Preliminary result)   Collection Time: 12/25/16  1:09 AM  Result Value Ref Range Status   Specimen Description EXPECTORATED SPUTUM  Final   Special Requests NONE Reflexed from [id number]  Final   Gram Stain   Final    RARE WBC PRESENT, PREDOMINANTLY MONONUCLEAR ABUNDANT GRAM NEGATIVE RODS ABUNDANT  GRAM POSITIVE COCCI IN PAIRS    Culture PENDING  Incomplete   Report Status PENDING  Incomplete    Radiology Reports Dg Chest Port 1 View  Result Date: 12/24/2016 CLINICAL DATA:  Acute onset of chest pain earlier this evening. EXAM: PORTABLE CHEST 1 VIEW COMPARISON:  04/26/2011, 12 28,011 and earlier, including CTA chest 09/22/2006. FINDINGS: Cardiac silhouette mildly enlarged. Hilar and mediastinal contours otherwise unremarkable. Lungs clear. Bronchovascular markings normal. Pulmonary vascularity normal. No visible pleural effusions. No pneumothorax. External pacing pads are present. IMPRESSION: Mild cardiomegaly.  No acute cardiopulmonary disease. Electronically Signed   By: Hulan Saas M.D.   On: 12/24/2016 20:20    Time Spent in minutes  30   Pearson Grippe M.D on 12/26/2016 at 7:06 AM  Between 7am to 7pm - Pager - [phone]  After 7pm go to [email or web address] - password [facility]  [facility] -  Office  [phone]

## 2016-12-26 NOTE — Progress Notes (Signed)
Patient's heart rate up to 130-150's sustaining for about 2.5 hours.  Patient denies any lightheadiness or dizziness.  Francis Dowseenee Ursuy PA on floor and made aware.  New orders received.  Colman Caterarpley, Cipriana Biller Danielle

## 2016-12-26 NOTE — Progress Notes (Signed)
  Echocardiogram 2D Echocardiogram has been performed.  Sydney SavoyCasey N Joyclyn Meyer 12/26/2016, 4:21 PM

## 2016-12-26 NOTE — Significant Event (Signed)
Rapid Response Event Note  Overview:Called by RN d/t rapid HR Time Called: 0110 Arrival Time: 0113 Event Type: Cardiac  Initial Focused Assessment: Pt laying in bed, alert and oriented, c/o 5/10 mid sternal chest pain.  Pt says it feels like her heart is "fluttering."  HR-60s-120s, SR/ST with frequent PACs/PVCs.  BP-164/88, RR-18, SpO2-94% on RA.    Interventions: Dr. Shirlee LatchMcLean notified PTA RRT and ordered amiodarone gtt.  Amiodarone gtt initiated by bedside RN.  Plan of Care (if not transferred): Monitor patient's HR and chest pain. Notify RRT if further assistance needed.  Event Summary: Name of Physician Notified: Dr. Shirlee LatchMcLean (notified PTA RRT) at 0110    at    Outcome: Stayed in room and stabalized  Event End Time: 0125  Terrilyn SaverHopper, Chistian Kasler Anderson

## 2016-12-26 NOTE — Plan of Care (Signed)
Problem: Health Behavior/Discharge Planning: Goal: Ability to manage health-related needs will improve Outcome: Not Progressing Pt in and out of A- Fib RVR/SVT and NSR throughout night. MD notified. Started in amio gtt. Pt continues to go as high as 160's and as low as 50's. Continue on gtt per MD. Pt c/o of some Chest pain/palpitations 5/10 w/ diaphoresis at start, now only a 2/10. Other VSS. BMP also monitored. Continue to monitor.

## 2016-12-26 NOTE — Progress Notes (Signed)
Progress Note  Patient Name: [name] Date of Encounter: 12/26/2016  Primary Cardiologist: new, Dr. Ladona Ridgel  Subjective   Feels better this morning, no ongoing or active CP, no SOB, palpitations.  She was aware of her AFib last PM   Inpatient Medications    Scheduled Meds: . aspirin EC  325 mg Oral Daily  . dextromethorphan-guaiFENesin  1 tablet Oral BID  . diltiazem  15 mg Intravenous Once  . potassium chloride  40 mEq Oral BID   Continuous Infusions: . amiodarone 30 mg/hr (12/26/16 0715)  . levofloxacin (LEVAQUIN) IV Stopped (12/25/16 2345)   PRN Meds: acetaminophen, ALPRAZolam, levalbuterol, morphine injection, nitroGLYCERIN, ondansetron (ZOFRAN) IV, zolpidem   Vital Signs    Vitals:   12/26/16 0300 12/26/16 0330 12/26/16 0400 12/26/16 0852  BP: (!) 145/87 (!) 151/75 (!) 146/71 133/69  Pulse: 91  98   Resp: 19 18 18 17   Temp:  98.7 F (37.1 C)  98.5 F (36.9 C)  TempSrc:  Oral  Oral  SpO2: 94% 94% 95% 97%  Weight:      Height:        Intake/Output Summary (Last 24 hours) at 12/26/16 0953 Last data filed at 12/26/16 0300  Gross per 24 hour  Intake          2481.37 ml  Output             2150 ml  Net           331.37 ml   Filed Weights   12/24/16 2124 12/25/16 0000  Weight: 238 lb 15.7 oz (108.4 kg) 243 lb 6.4 oz (110.4 kg)    Telemetry    Back in SR this morning, frequent PACs, blocked PACs, occ what looks like PVCs/couplets ,vs. use of accessory pathway, recurrent PAFib w/RVR last night - Personally Reviewed with Dr. Ladona Ridgel  ECG    No new EKGs  Physical Exam   GEN: No acute distress.   Neck: No JVD Cardiac: RRR, no murmurs, rubs, or gallops.  Respiratory: Clear to auscultation bilaterally. GI: Soft, nontender, obese MS: No edema; No deformity. Neuro:  Nonfocal  Psych: Normal affect   Labs    Chemistry Recent Labs Lab 12/24/16 1910 12/26/16 0221  NA 133* 138  K 2.8* 3.2*  CL 99* 103  CO2 23 27  GLUCOSE 166* 106*  BUN  12 5*  CREATININE 0.94 0.56  CALCIUM 8.8* 8.4*  PROT 7.8  --   ALBUMIN 3.6  --   AST 16  --   ALT 19  --   ALKPHOS 47  --   BILITOT 0.9  --   GFRNONAA >60 >60  GFRAA >60 >60  ANIONGAP 11 8     Hematology Recent Labs Lab 12/24/16 1910  WBC 27.2*  RBC 4.50  HGB 13.7  HCT 39.4  MCV 87.6  MCH 30.4  MCHC 34.8  RDW 13.4  PLT 253    Cardiac Enzymes Recent Labs Lab 12/24/16 1910 12/25/16 0229 12/25/16 0903 12/25/16 1240  TROPONINI <0.03 0.86* 0.55* 0.49*   No results for input(s): TROPIPOC in the last 168 hours.   BNP Recent Labs Lab 12/25/16 0445  BNP 130.6*      Radiology    Dg Chest Port 1 View Result Date: 12/24/2016 CLINICAL DATA:  Acute onset of chest pain earlier this evening. EXAM: PORTABLE CHEST 1 VIEW COMPARISON:  04/26/2011, 12 28,011 and earlier, including CTA chest 09/22/2006. FINDINGS: Cardiac silhouette mildly enlarged. Hilar and mediastinal contours otherwise  unremarkable. Lungs clear. Bronchovascular markings normal. Pulmonary vascularity normal. No visible pleural effusions. No pneumothorax. External pacing pads are present. IMPRESSION: Mild cardiomegaly.  No acute cardiopulmonary disease. Electronically Signed   By: Hulan Saashomas  Lawrence M.D.   On: 12/24/2016 20:20    Cardiac Studies   Echo is pending  Patient Profile     51 y.o. female with no known PMHx who is being followed by EP service for the evaluation of Rapid AFib, probable WPW.  Admitted with febrile illness, sepsis picture, CP w/AFib RVR >200bpm on arrival   Assessment & Plan    1. Rapid AFib     CHA2DS2Vasc is one for female gender, no need for anticoagulation     Given CBG/obesity, suspect DM     Recommend Hgb A1c, if dx of DM is confirmed will need a/c for score of 2  Will stop amiodarone started last PM and start Flecainide with lopressor today, monitor response, no plans for discharge yet  2. WPW     V1 and V2 have a delta wave, will review EKG with Dr. Ladona Ridgelaylor     no hx of  syncope     patient admited with sepsis     Likely will plan for out patient f/u EPS  3. Abnormal Troponin trending down     Card service did not suspect ACS     Agree, likely demand ischemia given RVR, no c/o CP, no ischemic changes on her EKG in SR     Echo is pending  3. Febrile illness     Marked leukocytosis     Abnormal UA and suspect URI     C/w medicine service  4. Hypokalemia     better but remains low, will more aggressively replace today     C/w medicine service   Signed, Sheilah PigeonRenee Lynn Ursuy, PA-C  12/26/2016, 9:53 AM    EP Attending  Patient seen and examined. Agree with above. The patient has continued to have PAF although her ventricular response is slower. Her electrolytes are still not replete. I would recommend switching her to flecainide initially in conjunction with a beta blocker.   Leonia ReevesGregg Taylor,M.D.

## 2016-12-26 NOTE — Progress Notes (Signed)
Pt having multiple PAC/s and PVC's. No labs ordered for morning. Pt still in and out of SVT. Notified Dr. Shirlee LatchMclean. Order placed for BMP. Keep on amio and continue to monitor.

## 2016-12-27 ENCOUNTER — Other Ambulatory Visit: Payer: Self-pay | Admitting: Physician Assistant

## 2016-12-27 DIAGNOSIS — I480 Paroxysmal atrial fibrillation: Secondary | ICD-10-CM

## 2016-12-27 LAB — COMPREHENSIVE METABOLIC PANEL
ALK PHOS: 50 U/L (ref 38–126)
ALT: 17 U/L (ref 14–54)
AST: 14 U/L — AB (ref 15–41)
Albumin: 2.9 g/dL — ABNORMAL LOW (ref 3.5–5.0)
Anion gap: 7 (ref 5–15)
BUN: 16 mg/dL (ref 6–20)
CALCIUM: 9 mg/dL (ref 8.9–10.3)
CHLORIDE: 103 mmol/L (ref 101–111)
CO2: 28 mmol/L (ref 22–32)
CREATININE: 0.63 mg/dL (ref 0.44–1.00)
GFR calc Af Amer: >60 mL/min (ref >60)
GFR calc non Af Amer: >60 mL/min (ref >60)
GLUCOSE: 102 mg/dL — AB (ref 65–99)
Potassium: 3.6 mmol/L (ref 3.5–5.1)
SODIUM: 138 mmol/L (ref 135–145)
Total Bilirubin: 0.8 mg/dL (ref 0.3–1.2)
Total Protein: 6.7 g/dL (ref 6.5–8.1)

## 2016-12-27 LAB — CBC
HEMATOCRIT: 35.1 % — AB (ref 36.0–46.0)
Hemoglobin: 11.5 g/dL — ABNORMAL LOW (ref 12.0–15.0)
MCH: 29 pg (ref 26.0–34.0)
MCHC: 32.8 g/dL (ref 30.0–36.0)
MCV: 88.6 fL (ref 78.0–100.0)
PLATELETS: 268 10*3/uL (ref 150–400)
RBC: 3.96 MIL/uL (ref 3.87–5.11)
RDW: 13.6 % (ref 11.5–15.5)
WBC: 12.9 10*3/uL — ABNORMAL HIGH (ref 4.0–10.5)

## 2016-12-27 LAB — CULTURE, RESPIRATORY

## 2016-12-27 LAB — CULTURE, RESPIRATORY W GRAM STAIN

## 2016-12-27 MED ORDER — FOSFOMYCIN TROMETHAMINE 3 G PO PACK
3.0000 g | PACK | Freq: Once | ORAL | Status: AC
  Administered 2016-12-27: 3 g via ORAL
  Filled 2016-12-27: qty 3

## 2016-12-27 MED ORDER — DOXYCYCLINE HYCLATE 100 MG PO TABS
100.0000 mg | ORAL_TABLET | Freq: Two times a day (BID) | ORAL | Status: DC
  Administered 2016-12-27 – 2016-12-28 (×3): 100 mg via ORAL
  Filled 2016-12-27 (×3): qty 1

## 2016-12-27 NOTE — Progress Notes (Signed)
Patient ID: [name], female   DOB: [birth year], 50 y.o.   MRN: [id number]                                                                PROGRESS NOTE                                                                                                                                                                                                             Patient Demographics:    [name], is a 51 y.o. female, DOB - 1966-02-19, XLK:[id number]  Admit date - 12/24/2016   Admitting Physician Sydney Harp, MD  Outpatient Primary MD for the patient is Patient, No Pcp Per  LOS - 2  Outpatient Specialists:     Chief Complaint  Patient presents with  . Chest Pain       Brief Narrative    51 y.o.femalewithout significant medical history, whopresents with chest pain and cough.  Patient states that she started having chest pain at about 6 PM. The chest pain is located in the substernal area, 10 out of 10 in severity, sharp, nonradiating. It is not pleuritic. No tenderness in the calf areas. She also has diaphoresis and palpitation. She has cough with brownish colored sputum production recently. She states that her grandson has cold recently. No fever, chills, or SOB.Patient does not have nausea, vomiting, diarrhea, abdominal pain, symptoms of UTI or unilateral weakness.  ED Course:found to have A. fib with RVR, heart rate wasup to 231. Patient was treated with 2 doses of adenosine, and started with Cardizem drip. She was converted to sinus rhythm with HR at 80 to 90s. Her chest pain has resolved.WBC 27.2, negative troponin, potassium 2.8, magnesium 1.8, sodium 133, creatinine normal, chest x-ray negative, oxygen saturation 96%. Pt is accepted to SDU for obs. EDP discussed with card fellow, who did not recommend cardioversion. Will needto call card again in AM.  Hospital Course:  Pt started on levaquin iv,  Blood culture x2 (6/27)=> ngtd, Respiratory culture 6/27=>  negative Trop elevation thought to be secondary to demand ischemia Cardizem Gtt discontinued on  6/27 by cardiology Afib with RVR 6/27 => started on amiodarone => SR Amiodarone d/c 6/28 , started Flecanide 6/28 Cardiac echo 6/28=> EF 65%      Subjective:  [name] today has had intermittent fast hr.   No headache, No chest pain, No abdominal pain - No Nausea, No new weakness tingling or numbness, No Cough - SOB.    Assessment  & Plan :    Principal Problem:   Chest pain Active Problems:   Atrial fibrillation with RVR (HCC)   Hypokalemia   Leukocytosis   Atrial fibrillation with rapid ventricular response (HCC)   Cough   Sepsis (HCC)   Elevated troponin   WPW (Wolff-Parkinson-White syndrome)    URI/UTI and sepsis: Pt hasproductive cough. CXR negative. Possibly due to bronchitis or upper respiratory viral infection given recent sick contact with her grandson. Patient meets criteria for sepsis with leukocytosis, fever of 101.6, tachypnea. Currently hemodynamically stable Levaquin 6/27=> 6/29 D/c levaquin due to QT prolongation Fosphomycin, today  and then doxycycline 100mg  po bid x 5 days   Chest pain:Most likely due to demand ischemia secondary to atrial fibrillation with RVR and sepsis. Initial troponin negative, but therepeat troponin is 0.83, currently patient does not have chest pain.  Appreciate cardiology input  Atrial Fibrillation with RVR:/ WPW  CHA2DS2-VASc Scoreis 1, notneeds oral anticoagulation. Has converted to sinus rhythm now. HR is 80s to 90s. It is likely triggered by sepsis. EDP discussed with card fellow, who did not recommend cardioversion Cardiology following , appreciate input  Hyperglycemia (Hga1c=5.4)  Hypokalemia: K= 2.8on admission. - Resolved    DVT ppx:  lovenox Code Status:Full code Family Communication: None at bed side.  Disposition Plan: Anticipate discharge back to previous home environment Consults  called:EDP discussed with card fellow, who did not recommend cardioversion. Will needto call card again in AM. Admission status: inpatient   Lab Results  Component Value Date   PLT 268 12/27/2016    Antibiotics  :   levaquin 6/27=>6/29 rocephine 6/29=>  Anti-infectives    Start     Dose/Rate Route Frequency Ordered Stop   12/26/16 1000  azithromycin (ZITHROMAX) tablet 250 mg  Status:  Discontinued     250 mg Oral Daily 12/25/16 0105 12/25/16 0406   12/25/16 2200  levofloxacin (LEVAQUIN) IVPB 750 mg     750 mg 100 mL/hr over 90 Minutes Intravenous Every 24 hours 12/25/16 0412     12/25/16 0500  levofloxacin (LEVAQUIN) IVPB 750 mg  Status:  Discontinued     750 mg 100 mL/hr over 90 Minutes Intravenous Every 24 hours 12/25/16 0409 12/25/16 0412   12/25/16 0115  azithromycin (ZITHROMAX) tablet 500 mg     500 mg Oral Daily 12/25/16 0105 12/25/16 0213        Objective:   Vitals:   12/26/16 1731 12/26/16 2100 12/26/16 2257 12/27/16 0100  BP: 111/83 114/62 116/64 112/60  Pulse: 84 80 (!) 145 76  Resp: 18 19  19   Temp: 98.9 F (37.2 C) 98.2 F (36.8 C)  98.5 F (36.9 C)  TempSrc: Oral Oral  Oral  SpO2: 96% 100%  95%  Weight:      Height:        Wt Readings from Last 3 Encounters:  12/25/16 110.4 kg (243 lb 6.4 oz)  04/26/11 113.4 kg (250 lb)     Intake/Output Summary (Last 24 hours) at 12/27/16 0606 Last data filed at 12/27/16 0300  Gross per 24 hour  Intake          1613.24 ml  Output             1650 ml  Net           -  36.76 ml     Physical Exam  Awake Alert, Oriented X 3, No new F.N deficits, Normal affect [name].AT,PERRAL Supple Neck,No JVD, No cervical lymphadenopathy appriciated.  Symmetrical Chest wall movement, Good air movement bilaterally, CTAB RRR,No Gallops,Rubs or new Murmurs, No Parasternal Heave +ve B.Sounds, Abd Soft, No tenderness, No organomegaly appriciated, No rebound - guarding or rigidity. No Cyanosis, Clubbing or edema, No new Rash or  bruise      Data Review:    CBC  Recent Labs Lab 12/24/16 1910 12/27/16 0326  WBC 27.2* 12.9*  HGB 13.7 11.5*  HCT 39.4 35.1*  PLT 253 268  MCV 87.6 88.6  MCH 30.4 29.0  MCHC 34.8 32.8  RDW 13.4 13.6  LYMPHSABS 0.5*  --   MONOABS 1.4*  --   EOSABS 0.0  --   BASOSABS 0.0  --     Chemistries   Recent Labs Lab 12/24/16 1910 12/26/16 0221 12/27/16 0326  NA 133* 138 138  K 2.8* 3.2* 3.6  CL 99* 103 103  CO2 23 27 28   GLUCOSE 166* 106* 102*  BUN 12 5* 16  CREATININE 0.94 0.56 0.63  CALCIUM 8.8* 8.4* 9.0  MG 1.8 2.1  --   AST 16  --  14*  ALT 19  --  17  ALKPHOS 47  --  50  BILITOT 0.9  --  0.8   ------------------------------------------------------------------------------------------------------------------  Recent Labs  12/25/16 0445  CHOL 158  HDL 33*  LDLCALC 93  TRIG 811*  CHOLHDL 4.8    Lab Results  Component Value Date   HGBA1C 5.4 12/25/2016   ------------------------------------------------------------------------------------------------------------------  Recent Labs  12/25/16 0445  TSH 1.578  T3FREE 2.1   ------------------------------------------------------------------------------------------------------------------ No results for input(s): VITAMINB12, FOLATE, FERRITIN, TIBC, IRON, RETICCTPCT in the last 72 hours.  Coagulation profile No results for input(s): INR, PROTIME in the last 168 hours.  No results for input(s): DDIMER in the last 72 hours.  Cardiac Enzymes  Recent Labs Lab 12/25/16 0229 12/25/16 0903 12/25/16 1240  TROPONINI 0.86* 0.55* 0.49*   ------------------------------------------------------------------------------------------------------------------    Component Value Date/Time   BNP 130.6 (H) 12/25/2016 0445    Inpatient Medications  Scheduled Meds: . aspirin EC  325 mg Oral Daily  . dextromethorphan-guaiFENesin  1 tablet Oral BID  . flecainide  100 mg Oral Q12H  . metoprolol tartrate  50 mg  Oral BID   Continuous Infusions: . levofloxacin (LEVAQUIN) IV Stopped (12/27/16 0029)   PRN Meds:.acetaminophen, ALPRAZolam, levalbuterol, morphine injection, nitroGLYCERIN, ondansetron (ZOFRAN) IV, zolpidem  Micro Results Recent Results (from the past 240 hour(s))  MRSA PCR Screening     Status: None   Collection Time: 12/25/16 12:40 AM  Result Value Ref Range Status   MRSA by PCR NEGATIVE NEGATIVE Final    Comment:        The GeneXpert MRSA Assay (FDA approved for NASAL specimens only), is one component of a comprehensive MRSA colonization surveillance program. It is not intended to diagnose MRSA infection nor to guide or monitor treatment for MRSA infections.   Culture, expectorated sputum-assessment     Status: None   Collection Time: 12/25/16  1:09 AM  Result Value Ref Range Status   Specimen Description EXPECTORATED SPUTUM  Final   Special Requests NONE  Final   Sputum evaluation THIS SPECIMEN IS ACCEPTABLE FOR SPUTUM CULTURE  Final   Report Status 12/25/2016 FINAL  Final  Culture, respiratory (NON-Expectorated)     Status: None (Preliminary result)   Collection Time: 12/25/16  1:09 AM  Result Value Ref Range Status   Specimen Description EXPECTORATED SPUTUM  Final   Special Requests NONE Reflexed from [id number]  Final   Gram Stain   Final    RARE WBC PRESENT, PREDOMINANTLY MONONUCLEAR ABUNDANT GRAM NEGATIVE RODS ABUNDANT GRAM POSITIVE COCCI IN PAIRS    Culture CULTURE REINCUBATED FOR BETTER GROWTH  Final   Report Status PENDING  Incomplete  Culture, blood (x 2)     Status: None (Preliminary result)   Collection Time: 12/25/16  1:12 AM  Result Value Ref Range Status   Specimen Description BLOOD LEFT HAND  Final   Special Requests   Final    BOTTLES DRAWN AEROBIC ONLY Blood Culture adequate volume   Culture NO GROWTH 1 DAY  Final   Report Status PENDING  Incomplete  Culture, blood (x 2)     Status: None (Preliminary result)   Collection Time: 12/25/16  2:30 AM   Result Value Ref Range Status   Specimen Description BLOOD LEFT ANTECUBITAL  Final   Special Requests   Final    BOTTLES DRAWN AEROBIC AND ANAEROBIC Blood Culture results may not be optimal due to an excessive volume of blood received in culture bottles   Culture NO GROWTH 1 DAY  Final   Report Status PENDING  Incomplete  Respiratory Panel by PCR     Status: None   Collection Time: 12/25/16 10:20 AM  Result Value Ref Range Status   Adenovirus NOT DETECTED NOT DETECTED Final   Coronavirus 229E NOT DETECTED NOT DETECTED Final   Coronavirus HKU1 NOT DETECTED NOT DETECTED Final   Coronavirus NL63 NOT DETECTED NOT DETECTED Final   Coronavirus OC43 NOT DETECTED NOT DETECTED Final   Metapneumovirus NOT DETECTED NOT DETECTED Final   Rhinovirus / Enterovirus NOT DETECTED NOT DETECTED Final   Influenza A NOT DETECTED NOT DETECTED Final   Influenza B NOT DETECTED NOT DETECTED Final   Parainfluenza Virus 1 NOT DETECTED NOT DETECTED Final   Parainfluenza Virus 2 NOT DETECTED NOT DETECTED Final   Parainfluenza Virus 3 NOT DETECTED NOT DETECTED Final   Parainfluenza Virus 4 NOT DETECTED NOT DETECTED Final   Respiratory Syncytial Virus NOT DETECTED NOT DETECTED Final   Bordetella pertussis NOT DETECTED NOT DETECTED Final   Chlamydophila pneumoniae NOT DETECTED NOT DETECTED Final   Mycoplasma pneumoniae NOT DETECTED NOT DETECTED Final    Radiology Reports Dg Chest Port 1 View  Result Date: 12/24/2016 CLINICAL DATA:  Acute onset of chest pain earlier this evening. EXAM: PORTABLE CHEST 1 VIEW COMPARISON:  04/26/2011, 12 28,011 and earlier, including CTA chest 09/22/2006. FINDINGS: Cardiac silhouette mildly enlarged. Hilar and mediastinal contours otherwise unremarkable. Lungs clear. Bronchovascular markings normal. Pulmonary vascularity normal. No visible pleural effusions. No pneumothorax. External pacing pads are present. IMPRESSION: Mild cardiomegaly.  No acute cardiopulmonary disease.  Electronically Signed   By: Hulan Saashomas  Lawrence M.D.   On: 12/24/2016 20:20    Time Spent in minutes  30   Pearson GrippeJames Burnett Lieber M.D on 12/27/2016 at 6:06 AM  Between 7am to 7pm - Pager - [phone]  After 7pm go to [email or web address] - password [email or web address]  [facility] -  Office  [phone]

## 2016-12-27 NOTE — Care Management Note (Addendum)
Case Management Note  Patient Details  Name: [name] MRN: [id number] Date of Birth: May 20, 1966  Subjective/Objective: Pt presented for Chest pain and A fib. Pt is without Insurance and PCP-CM did speak with pt in regards to Hospital F/u appointment. [facility] App[facility]ment scheduled and placed on AVS. Pt states medication will be affordable. Pt does work at [employer]. Pt will be able [facility]. Pt usually gets medications at [facility],                   Action/Plan: No further needs identified.   Expected Discharge Date:                  Expected Discharge Plan:  Home/Self Care  In-House Referral:  NA  Discharge planning Services  Follow-up appt scheduled, [facility], CM Consult, Medication Assistance  Post Acute Care Choice:  NA Choice offered to:  NA  DME Arranged:  N/A DME Agency:  NA  HH Arranged:  NA HH Agency:  NA  Status of Service:  Completed, signed off  If discussed at Long Length of Stay Meetings, dates discussed:    Additional Comments:  Gala LewandowskyGraves-Bigelow, Aiyanah Kalama Kaye, RN 12/27/2016, 4:42 PM

## 2016-12-27 NOTE — Plan of Care (Signed)
Problem: Tissue Perfusion: Goal: Risk factors for ineffective tissue perfusion will decrease Outcome: Progressing VSS throughout night. Afebrile. Medications administered. Pt had one very short episode where HR jumped to 145. Pt was asymptomatic at that time.  Continue to monitor.

## 2016-12-27 NOTE — Progress Notes (Signed)
Progress Note  Patient Name: [name] Date of Encounter: 12/27/2016  Primary Cardiologist: new, Dr. Ladona Ridgel  Subjective   Feels better this morning, no ongoing or active CP, no SOB, palpitations.    Inpatient Medications    Scheduled Meds: . aspirin EC  325 mg Oral Daily  . dextromethorphan-guaiFENesin  1 tablet Oral BID  . doxycycline  100 mg Oral Q12H  . flecainide  100 mg Oral Q12H  . metoprolol tartrate  50 mg Oral BID   Continuous Infusions:  PRN Meds: acetaminophen, ALPRAZolam, levalbuterol, morphine injection, nitroGLYCERIN, ondansetron (ZOFRAN) IV, zolpidem   Vital Signs    Vitals:   12/26/16 2257 12/27/16 0100 12/27/16 0700 12/27/16 0908  BP: 116/64 112/60 (!) 100/47 (!) 108/43  Pulse: (!) 145 76 (!) 47 (!) 48  Resp:  19 19 19   Temp:  98.5 F (36.9 C) 98.6 F (37 C) 99.5 F (37.5 C)  TempSrc:  Oral Oral Oral  SpO2:  95% 95% 97%  Weight:   241 lb 11.2 oz (109.6 kg)   Height:        Intake/Output Summary (Last 24 hours) at 12/27/16 1111 Last data filed at 12/27/16 0300  Gross per 24 hour  Intake              870 ml  Output             1200 ml  Net             -330 ml   Filed Weights   12/24/16 2124 12/25/16 0000 12/27/16 0700  Weight: 238 lb 15.7 oz (108.4 kg) 243 lb 6.4 oz (110.4 kg) 241 lb 11.2 oz (109.6 kg)    Telemetry    SR, yesterday with PAFib/RVR, significant atrial ectopy, much improved this AM - Personally Reviewed   ECG    No new EKGs  Physical Exam   GEN: No acute distress.   Neck: No JVD Cardiac: RRR, no murmurs, rubs, or gallops.  Respiratory: Clear to auscultation bilaterally. GI: Soft, nontender, obese MS: No edema; No deformity. Neuro:  Nonfocal  Psych: Normal affect   Labs    Chemistry  Recent Labs Lab 12/24/16 1910 12/26/16 0221 12/27/16 0326  NA 133* 138 138  K 2.8* 3.2* 3.6  CL 99* 103 103  CO2 23 27 28   GLUCOSE 166* 106* 102*  BUN 12 5* 16  CREATININE 0.94 0.56 0.63  CALCIUM 8.8* 8.4* 9.0   PROT 7.8  --  6.7  ALBUMIN 3.6  --  2.9*  AST 16  --  14*  ALT 19  --  17  ALKPHOS 47  --  50  BILITOT 0.9  --  0.8  GFRNONAA >60 >60 >60  GFRAA >60 >60 >60  ANIONGAP 11 8 7      Hematology  Recent Labs Lab 12/24/16 1910 12/27/16 0326  WBC 27.2* 12.9*  RBC 4.50 3.96  HGB 13.7 11.5*  HCT 39.4 35.1*  MCV 87.6 88.6  MCH 30.4 29.0  MCHC 34.8 32.8  RDW 13.4 13.6  PLT 253 268    Cardiac Enzymes  Recent Labs Lab 12/24/16 1910 12/25/16 0229 12/25/16 0903 12/25/16 1240  TROPONINI <0.03 0.86* 0.55* 0.49*   No results for input(s): TROPIPOC in the last 168 hours.   BNP  Recent Labs Lab 12/25/16 0445  BNP 130.6*      Radiology    Dg Chest Port 1 View Result Date: 12/24/2016 CLINICAL DATA:  Acute onset of chest pain earlier  this evening. EXAM: PORTABLE CHEST 1 VIEW COMPARISON:  04/26/2011, 12 28,011 and earlier, including CTA chest 09/22/2006. FINDINGS: Cardiac silhouette mildly enlarged. Hilar and mediastinal contours otherwise unremarkable. Lungs clear. Bronchovascular markings normal. Pulmonary vascularity normal. No visible pleural effusions. No pneumothorax. External pacing pads are present. IMPRESSION: Mild cardiomegaly.  No acute cardiopulmonary disease. Electronically Signed   By: Hulan Saashomas  Lawrence M.D.   On: 12/24/2016 20:20    Cardiac Studies   12/26/16: TTE Study Conclusions - Left ventricle: Wall thickness was increased in a pattern of   moderate LVH. Systolic function was normal. The estimated   ejection fraction was in the range of 60% to 65%. Left   ventricular diastolic function parameters were normal. - Aortic valve: There was trivial regurgitation. - Pulmonary arteries: PA peak pressure: 35 mm Hg (S).  Patient Profile     51 y.o. female with no known PMHx who is being followed by EP service for the evaluation of Rapid AFib, probable WPW.  Admitted with febrile illness, sepsis picture, CP w/AFib RVR >220bpm on arrival   Assessment & Plan    1.  Rapid AFib     CHA2DS2Vasc is one for female gender, no need for anticoagulation     HgbA1c 5.4     Continue to have PAF w/RVR yesterday, improved this morning on Flecainide 100mg  BID and BB     Monitor today with plans for discharge tomorrow from EP standpoint if HR/rhythm remain stable today     F/u stress testing for Flecainide has been arranged, patient is aware  2. WPW     V1 and V2 have a delta wave, will review EKG with Dr. Ladona Ridgelaylor     no hx of syncope     patient admited with sepsis     Will plan for out patient f/u EPS  3. Abnormal Troponin trending down     Card service did not suspect ACS     Agree, likely demand ischemia given RVR, no c/o CP, no ischemic changes on her EKG in SR     Echo is pending  3. Febrile illness     Marked leukocytosis is trending down     Abnormal UA and suspect URI     C/w medicine service  4. Hypokalemia      resolved   Signed, Sheilah PigeonRenee Lynn Ursuy, PA-C  12/27/2016, 11:11 AM    EP Attending  Patient seen and examined. Agree with above. Her atrial ectopy has settled down nicely on flecainide. She can be discharged home tomorrow. We will arrange followup. She will need an ECG in a week and an exercise test on medical therapy.  Leonia ReevesGregg Taylor,M.D

## 2016-12-28 LAB — COMPREHENSIVE METABOLIC PANEL
ALBUMIN: 2.9 g/dL — AB (ref 3.5–5.0)
ALK PHOS: 50 U/L (ref 38–126)
ALT: 17 U/L (ref 14–54)
ANION GAP: 8 (ref 5–15)
AST: 16 U/L (ref 15–41)
BUN: 15 mg/dL (ref 6–20)
CALCIUM: 8.7 mg/dL — AB (ref 8.9–10.3)
CO2: 27 mmol/L (ref 22–32)
Chloride: 104 mmol/L (ref 101–111)
Creatinine, Ser: 0.58 mg/dL (ref 0.44–1.00)
GFR calc Af Amer: >60 mL/min (ref >60)
GFR calc non Af Amer: >60 mL/min (ref >60)
GLUCOSE: 104 mg/dL — AB (ref 65–99)
POTASSIUM: 3.2 mmol/L — AB (ref 3.5–5.1)
SODIUM: 139 mmol/L (ref 135–145)
Total Bilirubin: 0.5 mg/dL (ref 0.3–1.2)
Total Protein: 6.8 g/dL (ref 6.5–8.1)

## 2016-12-28 LAB — CBC
HCT: 34.2 % — ABNORMAL LOW (ref 36.0–46.0)
HEMOGLOBIN: 11 g/dL — AB (ref 12.0–15.0)
MCH: 28.9 pg (ref 26.0–34.0)
MCHC: 32.2 g/dL (ref 30.0–36.0)
MCV: 90 fL (ref 78.0–100.0)
Platelets: 285 10*3/uL (ref 150–400)
RBC: 3.8 MIL/uL — ABNORMAL LOW (ref 3.87–5.11)
RDW: 13.7 % (ref 11.5–15.5)
WBC: 14.5 10*3/uL — AB (ref 4.0–10.5)

## 2016-12-28 MED ORDER — DOXYCYCLINE HYCLATE 100 MG PO TABS: 100.0000 mg | ORAL_TABLET | Freq: Two times a day (BID) | ORAL | 0 refills | Status: DC

## 2016-12-28 MED ORDER — POTASSIUM CHLORIDE CRYS ER 20 MEQ PO TBCR
40.0000 meq | EXTENDED_RELEASE_TABLET | Freq: Once | ORAL | Status: AC
  Administered 2016-12-28: 40 meq via ORAL
  Filled 2016-12-28: qty 2

## 2016-12-28 MED ORDER — ASPIRIN 81 MG PO CHEW
81.0000 mg | CHEWABLE_TABLET | Freq: Once | ORAL | Status: AC
  Administered 2016-12-28: 81 mg via ORAL
  Filled 2016-12-28: qty 1

## 2016-12-28 MED ORDER — FLECAINIDE ACETATE 100 MG PO TABS: 100.0000 mg | ORAL_TABLET | Freq: Two times a day (BID) | ORAL | 0 refills | Status: DC

## 2016-12-28 MED ORDER — METOPROLOL TARTRATE 50 MG PO TABS: 50.0000 mg | ORAL_TABLET | Freq: Two times a day (BID) | ORAL | 0 refills | Status: DC

## 2016-12-28 NOTE — Discharge Summary (Signed)
Physician Discharge Summary  [name] ZOX:[id number] DOB: [birth year] DOA: 12/24/2016  PCP: Patient, No Pcp Per  Admit date: 12/24/2016 Discharge date: 12/28/2016  Admitted From: Home  Disposition:  Home  Recommendations for Outpatient Follow-up:  1. Follow up with [facility], appointment scheduled on 7/10 2. [facility], 01/02/2017 at 11 AM for a stress test 3. [facility] with Lewayne Bunting on 7/31 at 11 AM 4. Started on metoprolol and flecainide 5. Given a course of doxycycline for UTI 6. Sputum culture is pending  Home Health:  None  Equipment/Devices:  None   Discharge Condition:  Stable, improved CODE STATUS:  Full code  Diet recommendation:  Healthy heart   Brief/Interim Summary:  The patient is a 51 year old female without significant past medical history who presented with retrosternal chest pain 10 out of 10 in severity which was sharp and nonradiating. It was not pleuritic. She had associated diaphoresis and palpitations. In the emergency department, she was found to be in A. fib with RVR with a heart rate of 231. She was treated with 2 doses of adenosine and started on a Cardizem drip. She converted to normal sinus rhythm with a heart rate of 80s to the 90s. Her initial troponin was negative.  Chest x-ray was unremarkable.  Her troponins rose which was thought to be due to demand ischemia from her severe tachycardia. Cardiology was consulted and started her on amiodarone initially. Her amiodarone was discontinued on 6/28 and she was transitioned to flecainide on the same day. Her echocardiogram demonstrated preserved ejection fraction of 65% with no significant valvular dysfunction. He remained mostly in normal sinus rhythm however she had intermittent episodes of SVT. She had another episode around 5 AM on the date of discharge which lasted for about 5 minutes. She was asleep at the time and had no recollection of symptoms. Her telemetry was  reviewed by Dr. physiology who recommended continuing her flecainide and having a stress test done as an outpatient in a few days to test the efficacy of her metoprolol and flecainide. Her chads 2 vasc was 1 and therefore she did not need anticoagulation. 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  Her course was complicated by sepsis secondary to a possible urinary tract infection versus URI.  She was treated with antibiotics and will be discharged with a few more days of doxycycline to continue at home.   Discharge Diagnoses:  Principal Problem:   Chest pain Active Problems:   Atrial fibrillation with RVR (HCC)   Hypokalemia   Leukocytosis   Atrial fibrillation with rapid ventricular response (HCC)   Cough   Sepsis (HCC)   Elevated troponin   WPW (Wolff-Parkinson-White syndrome)   A. fib with RVR, SVT, possible Wolff-Parkinson-White syndrome -  Continue metoprolol 50 mg by mouth twice a day and flecainide 100 mg by mouth twice a day at home -  Stress test on 7/5 and follow-up with cardiology on 7/31 -  No need for anticoagulation, see above  Chest pain with elevated troponins, peak troponin was 0.83.  No regional wall motion abnormalities on echocardiogram and her ejection fraction was preserved.  Her chest pain was associated with her severe tachycardia was likely due to demand ischemia. -  Outpatient stress test scheduled  Sepsis with peak WBC of 27K due to possible upper respiratory infection versus UTI present at time of admission. Chest x-ray was negative.  UA demonstrated too numerous to count WBCs. -  Continue doxycycline  for 4 more days -  Blood cultures were negative -  Sputum culture is pending -  Respiratory viral panel was negative  Hyperglycemia (Hga1c=5.4)  Hypokalemia: K= 2.8on admission.  Given intermittent oral potassium  Normocytic anemia, mild, asymptomatic.  No obvious hemorrhage or evidence of hemolysis.  acute phase reactant. Would repeat CBC in a few weeks. If her  anemia persists, would pursue further workup as indicated.  Discharge Instructions  Discharge Instructions    Call MD for:  difficulty breathing, headache or visual disturbances    Complete by:  As directed    Call MD for:  extreme fatigue    Complete by:  As directed    Call MD for:  hives    Complete by:  As directed    Call MD for:  persistant dizziness or light-headedness    Complete by:  As directed    Call MD for:  persistant nausea and vomiting    Complete by:  As directed    Call MD for:  severe uncontrolled pain    Complete by:  As directed    Call MD for:  temperature >100.4    Complete by:  As directed    Diet general    Complete by:  As directed    Increase activity slowly    Complete by:  As directed        Medication List    TAKE these medications   doxycycline 100 MG tablet Commonly known as:  VIBRA-TABS Take 1 tablet (100 mg total) by mouth every 12 (twelve) hours.   flecainide 100 MG tablet Commonly known as:  TAMBOCOR Take 1 tablet (100 mg total) by mouth every 12 (twelve) hours.   metoprolol tartrate 50 MG tablet Commonly known as:  LOPRESSOR Take 1 tablet (50 mg total) by mouth 2 (two) times daily.      Follow-up Information    [facility] [address] [facility] Follow up on 01/02/2017.   Specialty:  Cardiology Why:  11:00AM, stress test  you will be called by the office with pre-test instructions Contact information: [address], [address] [place] [id number] [phone]       Marinus Mawaylor, Gregg W, MD Follow up on 01/28/2017.   Specialty:  Cardiology Why:  11:00AM Contact information: [address] [place] [address] [phone]        [facility] up on 01/07/2017.   Why:  [facility] 10[facility]00[facility]. Please bring ID and proof of household income. Please ask to speak with Jerene BearsJanet Barret if you need medication assistance.   Contact information: [address] [place] [address] [phone]          Allergies  Allergen Reactions  . Penicillins Shortness Of Breath, Swelling and Other (See Comments)    Swells throat  . Bee Venom Swelling and Other (See Comments)    Hard spots    Consultations: Electrophysiology    Procedures/Studies: Dg Chest Port 1 View  Result Date: 12/24/2016 CLINICAL DATA:  Acute onset of chest pain earlier this evening. EXAM: PORTABLE CHEST 1 VIEW COMPARISON:  04/26/2011, 12 28,011 and earlier, including CTA chest 09/22/2006. FINDINGS: Cardiac silhouette mildly enlarged. Hilar and mediastinal contours otherwise unremarkable. Lungs clear. Bronchovascular markings normal. Pulmonary vascularity normal. No visible pleural effusions. No pneumothorax. External pacing pads are present. IMPRESSION: Mild cardiomegaly.  No acute cardiopulmonary disease. Electronically Signed   By: Kayren Eaveshomas  Lawrence M.D.  On: 12/24/2016 20:20    ECHO  "- Left ventricle: Wall thickness was increased in a pattern of   moderate LVH. Systolic function was normal. The estimated   ejection fraction was in the range of 60% to 65%. Left   ventricular diastolic function parameters were normal. - Aortic valve: There was trivial regurgitation. - Pulmonary arteries: PA peak pressure: 35 mm Hg (S)."   Subjective: Has no recollection of her tachycardia overnight. She was asleep. Otherwise, she has continues to have mild cough but is feeling well. She denies chest pains, shortness of breath, nausea, diaphoresis, lightheadedness, lower extremity swelling.  Discharge Exam: Vitals:   12/28/16 0429 12/28/16 0802  BP: 125/66 138/74  Pulse:  74  Resp: 18   Temp: 99.3 F (37.4 C)    Vitals:   12/27/16 2037 12/28/16 0415 12/28/16 0429 12/28/16 0802  BP: 132/61  125/66 138/74  Pulse: 77   74  Resp: (!) 181  18   Temp: 98.7 F (37.1 C)  99.3 F (37.4 C)   TempSrc: Oral  Oral   SpO2: 97%  95%   Weight:  107 kg (235 lb 12.8 oz)    Height:         General: Pt is alert, awake, not in acute distress, Sitting up in chair Cardiovascular: RRR, S1/S2 +, no rubs, no gallops Respiratory: CTA bilaterally, no wheezing, no rhonchi Abdominal: Soft, NT, ND, bowel sounds + Extremities: no edema, no cyanosis    The results of significant diagnostics from this hospitalization (including imaging, microbiology, ancillary and laboratory) are listed below for reference.     Microbiology: Recent Results (from the past 240 hour(s))  MRSA PCR Screening     Status: None   Collection Time: 12/25/16 12:40 AM  Result Value Ref Range Status   MRSA by PCR NEGATIVE NEGATIVE Final    Comment:        The GeneXpert MRSA Assay (FDA approved for NASAL specimens only), is one component of a comprehensive MRSA colonization surveillance program. It is not intended to diagnose MRSA infection nor to guide or monitor treatment for MRSA infections.   Culture, expectorated sputum-assessment     Status: None   Collection Time: 12/25/16  1:09 AM  Result Value Ref Range Status   Specimen Description EXPECTORATED SPUTUM  Final   Special Requests NONE  Final   Sputum evaluation THIS SPECIMEN IS ACCEPTABLE FOR SPUTUM CULTURE  Final   Report Status 12/25/2016 FINAL  Final  Culture, respiratory (NON-Expectorated)     Status: None   Collection Time: 12/25/16  1:09 AM  Result Value Ref Range Status   Specimen Description EXPECTORATED SPUTUM  Final   Special Requests NONE Reflexed from [id number]  Final   Gram Stain   Final    RARE WBC PRESENT, PREDOMINANTLY MONONUCLEAR ABUNDANT GRAM NEGATIVE RODS ABUNDANT GRAM POSITIVE COCCI IN PAIRS    Culture MODERATE GROUP A STREP (S.PYOGENES) ISOLATED  Final   Report Status 12/27/2016 FINAL  Final  Culture, blood (x 2)     Status: None (Preliminary result)   Collection Time: 12/25/16  1:12 AM  Result Value Ref Range Status   Specimen Description BLOOD LEFT HAND  Final   Special Requests   Final    BOTTLES DRAWN AEROBIC  ONLY Blood Culture adequate volume   Culture NO GROWTH 2 DAYS  Final   Report Status PENDING  Incomplete  Culture, blood (x 2)     Status: None (Preliminary result)   Collection Time:  12/25/16  2:30 AM  Result Value Ref Range Status   Specimen Description BLOOD LEFT ANTECUBITAL  Final   Special Requests   Final    BOTTLES DRAWN AEROBIC AND ANAEROBIC Blood Culture results may not be optimal due to an excessive volume of blood received in culture bottles   Culture NO GROWTH 2 DAYS  Final   Report Status PENDING  Incomplete  Respiratory Panel by PCR     Status: None   Collection Time: 12/25/16 10:20 AM  Result Value Ref Range Status   Adenovirus NOT DETECTED NOT DETECTED Final   Coronavirus 229E NOT DETECTED NOT DETECTED Final   Coronavirus HKU1 NOT DETECTED NOT DETECTED Final   Coronavirus NL63 NOT DETECTED NOT DETECTED Final   Coronavirus OC43 NOT DETECTED NOT DETECTED Final   Metapneumovirus NOT DETECTED NOT DETECTED Final   Rhinovirus / Enterovirus NOT DETECTED NOT DETECTED Final   Influenza A NOT DETECTED NOT DETECTED Final   Influenza B NOT DETECTED NOT DETECTED Final   Parainfluenza Virus 1 NOT DETECTED NOT DETECTED Final   Parainfluenza Virus 2 NOT DETECTED NOT DETECTED Final   Parainfluenza Virus 3 NOT DETECTED NOT DETECTED Final   Parainfluenza Virus 4 NOT DETECTED NOT DETECTED Final   Respiratory Syncytial Virus NOT DETECTED NOT DETECTED Final   Bordetella pertussis NOT DETECTED NOT DETECTED Final   Chlamydophila pneumoniae NOT DETECTED NOT DETECTED Final   Mycoplasma pneumoniae NOT DETECTED NOT DETECTED Final     Labs: BNP (last 3 results)  Recent Labs  12/25/16 0445  BNP 130.6*   Basic Metabolic Panel:  Recent Labs Lab 12/24/16 1910 12/26/16 0221 12/27/16 0326 12/28/16 0253  NA 133* 138 138 139  K 2.8* 3.2* 3.6 3.2*  CL 99* 103 103 104  CO2 23 27 28 27   GLUCOSE 166* 106* 102* 104*  BUN 12 5* 16 15  CREATININE 0.94 0.56 0.63 0.58  CALCIUM 8.8* 8.4* 9.0  8.7*  MG 1.8 2.1  --   --    Liver Function Tests:  Recent Labs Lab 12/24/16 1910 12/27/16 0326 12/28/16 0253  AST 16 14* 16  ALT 19 17 17   ALKPHOS 47 50 50  BILITOT 0.9 0.8 0.5  PROT 7.8 6.7 6.8  ALBUMIN 3.6 2.9* 2.9*   No results for input(s): LIPASE, AMYLASE in the last 168 hours. No results for input(s): AMMONIA in the last 168 hours. CBC:  Recent Labs Lab 12/24/16 1910 12/27/16 0326 12/28/16 0253  WBC 27.2* 12.9* 14.5*  NEUTROABS 25.3*  --   --   HGB 13.7 11.5* 11.0*  HCT 39.4 35.1* 34.2*  MCV 87.6 88.6 90.0  PLT 253 268 285   Cardiac Enzymes:  Recent Labs Lab 12/24/16 1910 12/25/16 0229 12/25/16 0903 12/25/16 1240  TROPONINI <0.03 0.86* 0.55* 0.49*   BNP: Invalid input(s): POCBNP CBG: No results for input(s): GLUCAP in the last 168 hours. D-Dimer No results for input(s): DDIMER in the last 72 hours. Hgb A1c No results for input(s): HGBA1C in the last 72 hours. Lipid Profile No results for input(s): CHOL, HDL, LDLCALC, TRIG, CHOLHDL, LDLDIRECT in the last 72 hours. Thyroid function studies No results for input(s): TSH, T4TOTAL, T3FREE, THYROIDAB in the last 72 hours.  Invalid input(s): FREET3 Anemia work up No results for input(s): VITAMINB12, FOLATE, FERRITIN, TIBC, IRON, RETICCTPCT in the last 72 hours. Urinalysis    Component Value Date/Time   COLORURINE AMBER (A) 12/25/2016 0105   APPEARANCEUR CLOUDY (A) 12/25/2016 0105   LABSPEC 1.025 12/25/2016 0105  PHURINE 5.0 12/25/2016 0105   GLUCOSEU NEGATIVE 12/25/2016 0105   HGBUR SMALL (A) 12/25/2016 0105   BILIRUBINUR NEGATIVE 12/25/2016 0105   KETONESUR 5 (A) 12/25/2016 0105   PROTEINUR 30 (A) 12/25/2016 0105   UROBILINOGEN 0.2 04/26/2011 1635   NITRITE NEGATIVE 12/25/2016 0105   LEUKOCYTESUR LARGE (A) 12/25/2016 0105   Sepsis Labs Invalid input(s): PROCALCITONIN,  WBC,  LACTICIDVEN   Time coordinating discharge: Over 30 minutes  SIGNED:   Renae Fickle, MD  [facility] 12/28/2016, 4:00 PM Pager   If 7PM-7AM, please contact night-coverage [email or web address] Password [email or web address]

## 2016-12-28 NOTE — Progress Notes (Signed)
Progress Note  Patient Name: [name] Date of Encounter: 12/28/2016  Primary Cardiologist: new, Dr. Ladona Ridgelaylor  Subjective   The patient denies chest pain, shortness of breath, nocturnal dyspnea, orthopnea or peripheral edema.  There have been no palpitations, lightheadedness or syncope.    Inpatient Medications    Scheduled Meds: . aspirin EC  325 mg Oral Daily  . dextromethorphan-guaiFENesin  1 tablet Oral BID  . doxycycline  100 mg Oral Q12H  . flecainide  100 mg Oral Q12H  . metoprolol tartrate  50 mg Oral BID   Continuous Infusions:  PRN Meds: acetaminophen, ALPRAZolam, levalbuterol, morphine injection, nitroGLYCERIN, ondansetron (ZOFRAN) IV, zolpidem   Vital Signs    Vitals:   12/27/16 1700 12/27/16 2037 12/28/16 0415 12/28/16 0429  BP: 130/61 132/61  125/66  Pulse: 80 77    Resp: 20 (!) 181  18  Temp: 98.6 F (37 C) 98.7 F (37.1 C)  99.3 F (37.4 C)  TempSrc: Oral Oral  Oral  SpO2: 99% 97%  95%  Weight:   235 lb 12.8 oz (107 kg)   Height:        Intake/Output Summary (Last 24 hours) at 12/28/16 0713 Last data filed at 12/27/16 1700  Gross per 24 hour  Intake             1320 ml  Output             1100 ml  Net              220 ml   Filed Weights   12/25/16 0000 12/27/16 0700 12/28/16 0415  Weight: 243 lb 6.4 oz (110.4 kg) 241 lb 11.2 oz (109.6 kg) 235 lb 12.8 oz (107 kg)    Telemetry    SR, no afib  ECG    No new EKGs  Physical Exam  Well developed and nourished in no acute distress HENT normal Neck supple with JVP-flat Carotids brisk and full without bruits Clear Regular rate and rhythm, no murmurs or gallops Abd-soft with active BS without hepatomegaly No Clubbing cyanosis edema Skin-warm and dry A & Oriented  Grossly normal sensory and motor function   Labs    Chemistry  Recent Labs Lab 12/24/16 1910 12/26/16 0221 12/27/16 0326 12/28/16 0253  NA 133* 138 138 139  K 2.8* 3.2* 3.6 3.2*  CL 99* 103 103 104  CO2 23  27 28 27   GLUCOSE 166* 106* 102* 104*  BUN 12 5* 16 15  CREATININE 0.94 0.56 0.63 0.58  CALCIUM 8.8* 8.4* 9.0 8.7*  PROT 7.8  --  6.7 6.8  ALBUMIN 3.6  --  2.9* 2.9*  AST 16  --  14* 16  ALT 19  --  17 17  ALKPHOS 47  --  50 50  BILITOT 0.9  --  0.8 0.5  GFRNONAA >60 >60 >60 >60  GFRAA >60 >60 >60 >60  ANIONGAP 11 8 7 8      Hematology  Recent Labs Lab 12/24/16 1910 12/27/16 0326 12/28/16 0253  WBC 27.2* 12.9* 14.5*  RBC 4.50 3.96 3.80*  HGB 13.7 11.5* 11.0*  HCT 39.4 35.1* 34.2*  MCV 87.6 88.6 90.0  MCH 30.4 29.0 28.9  MCHC 34.8 32.8 32.2  RDW 13.4 13.6 13.7  PLT 253 268 285    Cardiac Enzymes  Recent Labs Lab 12/24/16 1910 12/25/16 0229 12/25/16 0903 12/25/16 1240  TROPONINI <0.03 0.86* 0.55* 0.49*   No results for input(s): TROPIPOC in the last 168 hours.  BNP  Recent Labs Lab 12/25/16 0445  BNP 130.6*      Radiology    Dg Chest Port 1 View Result Date: 12/24/2016 CLINICAL DATA:  Acute onset of chest pain earlier this evening. EXAM: PORTABLE CHEST 1 VIEW COMPARISON:  04/26/2011, 12 28,011 and earlier, including CTA chest 09/22/2006. FINDINGS: Cardiac silhouette mildly enlarged. Hilar and mediastinal contours otherwise unremarkable. Lungs clear. Bronchovascular markings normal. Pulmonary vascularity normal. No visible pleural effusions. No pneumothorax. External pacing pads are present. IMPRESSION: Mild cardiomegaly.  No acute cardiopulmonary disease. Electronically Signed   By: Hulan Saas M.D.   On: 12/24/2016 20:20    Cardiac Studies   12/26/16: TTE Study Conclusions - Left ventricle: Wall thickness was increased in a pattern of   moderate LVH. Systolic function was normal. The estimated   ejection fraction was in the range of 60% to 65%. Left   ventricular diastolic function parameters were normal. - Aortic valve: There was trivial regurgitation. - Pulmonary arteries: PA peak pressure: 35 mm Hg (S).  Patient Profile     51 y.o. female  with no known PMHx who is being followed by EP service for the evaluation of Rapid AFib, probable WPW.  Admitted with febrile illness, sepsis picture, CP w/AFib RVR >220bpm on arrival   Assessment & Plan    1. Rapid AFib/Flutter     CHA2DS2Vasc is one for female gender, no need for anticoagulation    2. WPW  Not convinced     3. Abnormal Troponin trending down      3. Febrile illness    4. Hypokalemia      recurrent   Thre is NO CARDIAC indication for ASA  I have reduced to 81 and if not otherwise indicated would discharge prior to discharge  Replete K--- ordered   Plan is home on flecainide   outpt GXT arranged  Signed, Sherryl Manges, MD  12/28/2016, 7:13 AM

## 2016-12-28 NOTE — Progress Notes (Signed)
Pt had one episode of SVT 150's. Less than a few min. Spontaneous return to NSR. Denied any CP or SOB. Other VSS.

## 2016-12-28 NOTE — Discharge Instructions (Signed)
You are scheduled for stress test on July 5 to evaluate the flecainide.

## 2016-12-30 LAB — CULTURE, BLOOD (ROUTINE X 2): Culture: NO GROWTH

## 2016-12-31 LAB — CULTURE, BLOOD (ROUTINE X 2)
Culture: NO GROWTH
Special Requests: ADEQUATE

## 2017-01-02 ENCOUNTER — Ambulatory Visit (INDEPENDENT_AMBULATORY_CARE_PROVIDER_SITE_OTHER): Payer: Self-pay

## 2017-01-02 DIAGNOSIS — I480 Paroxysmal atrial fibrillation: Secondary | ICD-10-CM

## 2017-01-02 LAB — EXERCISE TOLERANCE TEST
CHL CUP MPHR: 168 {beats}/min
CHL CUP RESTING HR STRESS: 60 {beats}/min
CHL CUP STRESS STAGE 1 DBP: 75 mmHg
CHL CUP STRESS STAGE 1 GRADE: 0 %
CHL CUP STRESS STAGE 2 SPEED: 0 mph
CHL CUP STRESS STAGE 3 GRADE: 0 %
CHL CUP STRESS STAGE 3 HR: 59 {beats}/min
CHL CUP STRESS STAGE 3 SPEED: 1 mph
CHL CUP STRESS STAGE 5 SPEED: 1.7 mph
CHL CUP STRESS STAGE 6 GRADE: 0 %
CHL CUP STRESS STAGE 6 SBP: 119 mmHg
CHL CUP STRESS STAGE 6 SPEED: 0 mph
CHL CUP STRESS STAGE 7 DBP: 65 mmHg
CHL CUP STRESS STAGE 7 GRADE: 0 %
CHL CUP STRESS STAGE 7 SBP: 122 mmHg
CHL CUP STRESS STAGE 7 SPEED: 0 mph
CHL RATE OF PERCEIVED EXERTION: 15
CSEPED: 2 min
CSEPEDS: 27 s
CSEPEW: 4.6 METS
CSEPHR: 51 %
CSEPPHR: 85 {beats}/min
Percent of predicted max HR: 50 %
Stage 1 HR: 57 {beats}/min
Stage 1 SBP: 120 mmHg
Stage 1 Speed: 0 mph
Stage 2 Grade: 0 %
Stage 2 HR: 58 {beats}/min
Stage 4 Grade: 0.1 %
Stage 4 HR: 59 {beats}/min
Stage 4 Speed: 1 mph
Stage 5 Grade: 10 %
Stage 5 HR: 85 {beats}/min
Stage 6 DBP: 89 mmHg
Stage 6 HR: 78 {beats}/min
Stage 7 HR: 55 {beats}/min

## 2017-01-09 ENCOUNTER — Other Ambulatory Visit: Payer: Self-pay | Admitting: *Deleted

## 2017-01-09 DIAGNOSIS — I2510 Atherosclerotic heart disease of native coronary artery without angina pectoris: Secondary | ICD-10-CM

## 2017-01-14 ENCOUNTER — Encounter: Payer: Self-pay | Admitting: *Deleted

## 2017-01-16 ENCOUNTER — Telehealth (HOSPITAL_COMMUNITY): Payer: Self-pay | Admitting: *Deleted

## 2017-01-16 NOTE — Telephone Encounter (Signed)
Left message on voicemail in reference to upcoming appointment scheduled for 01/21/17 Phone number given for a call back so details instructions can be given. Ricky AlaSmith, Brandt Chaney Jacqueline

## 2017-01-20 ENCOUNTER — Telehealth (HOSPITAL_COMMUNITY): Payer: Self-pay | Admitting: *Deleted

## 2017-01-20 NOTE — Telephone Encounter (Signed)
Patient given detailed instructions per Myocardial Perfusion Study Information Sheet for the test on 01/21/17 at 12:30. Patient notified to arrive 15 minutes early and that it is imperative to arrive on time for appointment to keep from having the test rescheduled.  If you need to cancel or reschedule your appointment, please call the office within 24 hours of your appointment. . Patient verbalized understanding.Sydney DolinSharon S Brooks

## 2017-01-21 ENCOUNTER — Ambulatory Visit (HOSPITAL_COMMUNITY): Payer: Self-pay | Attending: Cardiology

## 2017-01-21 DIAGNOSIS — I2510 Atherosclerotic heart disease of native coronary artery without angina pectoris: Secondary | ICD-10-CM | POA: Insufficient documentation

## 2017-01-21 MED ORDER — REGADENOSON 0.4 MG/5ML IV SOLN
0.4000 mg | Freq: Once | INTRAVENOUS | Status: AC
Start: 2017-01-21 — End: 2017-01-21
  Administered 2017-01-21: 0.4 mg via INTRAVENOUS

## 2017-01-21 MED ORDER — TECHNETIUM TC 99M TETROFOSMIN IV KIT
32.6000 | PACK | Freq: Once | INTRAVENOUS | Status: AC | PRN
  Administered 2017-01-21: 32.6 via INTRAVENOUS
  Filled 2017-01-21: qty 33

## 2017-01-22 ENCOUNTER — Ambulatory Visit (HOSPITAL_COMMUNITY): Payer: Self-pay | Attending: Internal Medicine

## 2017-01-22 ENCOUNTER — Telehealth: Payer: Self-pay | Admitting: Internal Medicine

## 2017-01-22 LAB — MYOCARDIAL PERFUSION IMAGING
CHL CUP NUCLEAR SDS: 1
CHL CUP RESTING HR STRESS: 52 {beats}/min
LVDIAVOL: 149 mL (ref 46–106)
LVSYSVOL: 70 mL
Peak HR: 63 {beats}/min
RATE: 0.27
SRS: 7
SSS: 8
TID: 0.94

## 2017-01-22 MED ORDER — METOPROLOL TARTRATE 50 MG PO TABS: 50.0000 mg | ORAL_TABLET | Freq: Two times a day (BID) | ORAL | 0 refills | Status: DC

## 2017-01-22 MED ORDER — TECHNETIUM TC 99M TETROFOSMIN IV KIT
32.6000 | PACK | Freq: Once | INTRAVENOUS | Status: AC | PRN
  Administered 2017-01-22: 32.6 via INTRAVENOUS
  Filled 2017-01-22: qty 33

## 2017-01-22 MED ORDER — FLECAINIDE ACETATE 100 MG PO TABS
100.0000 mg | ORAL_TABLET | Freq: Two times a day (BID) | ORAL | 0 refills | Status: DC
Start: 2017-01-22 — End: 2017-01-29

## 2017-01-22 NOTE — Telephone Encounter (Signed)
Pt called for medication refills. Prescription was send to [facility] at [place] for Tambocor 100 mg every 12 hrs po and Metoprolol 50 mg po 2 times a day. Pt has an appointment with Dr. Ladona Ridgelaylor on 01/28/17 at 11:00 AM. Pt is aware.

## 2017-01-22 NOTE — Telephone Encounter (Signed)
Patient has a question about her medicine.  She is here in [place] now getting the second part of her stress test.  She sees Dr. Ladona Ridgelaylor on Tuesday but will run out of her medicine on Monday.  She's just wondering if it is acceptable to go 24 hours without it.  You can call patient or see her in [place] dept here.

## 2017-01-28 ENCOUNTER — Ambulatory Visit (INDEPENDENT_AMBULATORY_CARE_PROVIDER_SITE_OTHER): Payer: Self-pay | Admitting: Internal Medicine

## 2017-01-28 ENCOUNTER — Encounter: Payer: Self-pay | Admitting: Internal Medicine

## 2017-01-28 VITALS — BP 110/80 | HR 55 | Ht 69.0 in | Wt 254.4 lb

## 2017-01-28 DIAGNOSIS — I4891 Unspecified atrial fibrillation: Secondary | ICD-10-CM

## 2017-01-28 DIAGNOSIS — I456 Pre-excitation syndrome: Secondary | ICD-10-CM

## 2017-01-28 NOTE — Progress Notes (Signed)
HPI Ms. DenmarkEngland returns today for followup. She is an obese 51 yo woman with a h/o tachypalpitations who presented about 4 weeks ago with near syncope, chest pain and sob and was found to be in atrial fib with a very rapid VR and variable QRS morphologies worrisome for WPW. She was placed on metoprolol and flecainide. She has had minimal palpitations but feels fatigued. She notes that every day on these meds she feels wiped out by 2p.m. Minimal recurrent palpitations on medical therapy.  Allergies  Allergen Reactions  . Penicillins Shortness Of Breath, Swelling and Other (See Comments)    Swells throat  . Bee Venom Swelling and Other (See Comments)    Hard spots     Current Outpatient Prescriptions  Medication Sig Dispense Refill  . flecainide (TAMBOCOR) 100 MG tablet Take 1 tablet (100 mg total) by mouth every 12 (twelve) hours. 60 tablet 0  . metoprolol tartrate (LOPRESSOR) 50 MG tablet Take 1 tablet (50 mg total) by mouth 2 (two) times daily. 60 tablet 0   No current facility-administered medications for this visit.      Past Medical History:  Diagnosis Date  . Atrial fibrillation with RVR (HCC) 12/26/2016  . Sepsis (HCC)   . WPW (Wolff-Parkinson-White syndrome) 12/26/2016    ROS:   All systems reviewed and negative except as noted in the HPI.   Past Surgical History:  Procedure Laterality Date  . ABDOMINAL HYSTERECTOMY    . KNEE SURGERY       Family History  Problem Relation Age of Onset  . Diabetes Mellitus II Mother   . Heart failure Mother   . Heart attack Father   . Stroke Father      Social History   Social History  . Marital status: Single    Spouse name: N/A  . Number of children: N/A  . Years of education: N/A   Occupational History  . Not on file.   Social History Main Topics  . Smoking status: Never Smoker  . Smokeless tobacco: Never Used  . Alcohol use Yes     Comment: occ  . Drug use: No  . Sexual activity: Not on file    Other Topics Concern  . Not on file   Social History Narrative  . No narrative on file     BP 110/80   Pulse (!) 55   Ht 5\' 9"  (1.753 m)   Wt 254 lb 6.4 oz (115.4 kg)   SpO2 96%   BMI 37.57 kg/m   Physical Exam:  Well appearing 51 yo obese woman, NAD HEENT: Unremarkable Neck:  No JVD, no thyromegally Lymphatics:  No adenopathy Back:  No CVA tenderness Lungs:  Clear with no wheezes HEART:  Regular rate rhythm, no murmurs, no rubs, no clicks Abd:  soft, positive bowel sounds, no organomegally, no rebound, no guarding Ext:  2 plus pulses, no edema, no cyanosis, no clubbing Skin:  No rashes no nodules Neuro:  CN II through XII intact, motor grossly intact  EKG - NSR with no ventricular pre-excitation  Assess/Plan: 1. Atrial fib - she has had no recurrent symptoms on medical therapy. She appears to have WPW. On flecainide she has no evidence of ventricular pre-excitation. Unfortunately she has become intolerant to flecainde.  2. WPW - she appears to have pre-excitation on her 12 lead ECG when she was in atrial fib. I cannot rule out variable amounts of abherration but her VR was at times over  200/min. I have discussed the risk/benefits/goals/expectations of EP study and catheter ablation. She wishes to proceed.  3. Obesity - while her rapid ventricular rates are not due to her obesity, I strongly suspect that she will need to lose weight if she is to maintain NSR.   Leonia ReevesGregg [name],M.D.

## 2017-01-28 NOTE — Patient Instructions (Addendum)
Medication Instructions:  Your physician recommends that you continue on your current medications as directed. Please refer to the Current Medication list given to you today.   Labwork: You will need a BMP and a CBC prior to your procedure.  [facility] work 02/14/2017.   Testing/Procedures: Your physician has recommended that you have an ablation. Catheter ablation is a medical procedure used to treat some cardiac arrhythmias (irregular heartbeats). During catheter ablation, a long, thin, flexible tube is put into a blood vessel in your groin (upper thigh), or neck. This tube is called an ablation catheter. It is then guided to your heart through the blood vessel. Radio frequency waves destroy small areas of heart tissue where abnormal heartbeats may cause an arrhythmia to start. Please see the instruction sheet given to you today.    Follow-Up: Your physician wants you to follow-up in: 4 weeks with Sebastian Acheonna Carrol NP.  Follow up with Dr. Ladona Ridgelaylor 3 months after your procedure.     Any Other Special Instructions Will Be Listed Below (If Applicable).  [facility] at:  0530 on 02/24/2017  Do not eat or drink after midnight prior to procedure Do not take any medications starting two days prior to your procedure-last medications will be on Friday 02/21/2017. Plan for one night stay You will need someone to drive you home at discharge   If you need a refill on your cardiac medications before your next appointment, please call your pharmacy.

## 2017-01-29 ENCOUNTER — Other Ambulatory Visit: Payer: Self-pay | Admitting: Internal Medicine

## 2017-02-14 ENCOUNTER — Other Ambulatory Visit: Payer: Self-pay | Admitting: *Deleted

## 2017-02-14 DIAGNOSIS — I456 Pre-excitation syndrome: Secondary | ICD-10-CM

## 2017-02-14 LAB — BASIC METABOLIC PANEL
BUN / CREAT RATIO: 21 (ref 9–23)
BUN: 13 mg/dL (ref 6–24)
CO2: 26 mmol/L (ref 20–29)
CREATININE: 0.61 mg/dL (ref 0.57–1.00)
Calcium: 9.2 mg/dL (ref 8.7–10.2)
Chloride: 101 mmol/L (ref 96–106)
GFR calc Af Amer: 121 mL/min/{1.73_m2} (ref >59)
GFR, EST NON AFRICAN AMERICAN: 105 mL/min/{1.73_m2} (ref >59)
GLUCOSE: 89 mg/dL (ref 65–99)
Potassium: 4.4 mmol/L (ref 3.5–5.2)
SODIUM: 140 mmol/L (ref 134–144)

## 2017-02-14 LAB — CBC WITH DIFFERENTIAL/PLATELET
BASOS ABS: 0 10*3/uL (ref 0.0–0.2)
Basos: 0 %
EOS (ABSOLUTE): 0.4 10*3/uL (ref 0.0–0.4)
Eos: 6 %
HEMATOCRIT: 39.8 % (ref 34.0–46.6)
Hemoglobin: 13.3 g/dL (ref 11.1–15.9)
Immature Grans (Abs): 0 10*3/uL (ref 0.0–0.1)
Immature Granulocytes: 0 %
LYMPHS ABS: 2.6 10*3/uL (ref 0.7–3.1)
Lymphs: 34 %
MCH: 29.7 pg (ref 26.6–33.0)
MCHC: 33.4 g/dL (ref 31.5–35.7)
MCV: 89 fL (ref 79–97)
MONOCYTES: 10 %
MONOS ABS: 0.7 10*3/uL (ref 0.1–0.9)
NEUTROS ABS: 3.8 10*3/uL (ref 1.4–7.0)
Neutrophils: 50 %
Platelets: 238 10*3/uL (ref 150–379)
RBC: 4.48 x10E6/uL (ref 3.77–5.28)
RDW: 14.8 % (ref 12.3–15.4)
WBC: 7.6 10*3/uL (ref 3.4–10.8)

## 2017-02-21 ENCOUNTER — Telehealth: Payer: Self-pay

## 2017-02-21 NOTE — Telephone Encounter (Signed)
Call placed to Pt to provide lab results.  Per Pt, she received call from [facility] pre admit/pharmacy (?).  Representative from [facility] told Pt to continue her medications until Monday morning.  This is not what has been ordered for Pt by Dr. Ladona Ridgel.  Confirmed with Dr. Ladona Ridgel Pt should discontinue all medications Saturday and Sunday prior to procedure.  Confirmed with Pt that her AVS from office visit was correct.  Pt indicates understanding.

## 2017-02-24 ENCOUNTER — Ambulatory Visit (HOSPITAL_COMMUNITY)
Admission: RE | Admit: 2017-02-24 | Discharge: 2017-02-24 | Disposition: A | Discharge status: Home / Self Care | Payer: Self-pay | Source: Ambulatory Visit | Attending: Internal Medicine | Admitting: Internal Medicine

## 2017-02-24 ENCOUNTER — Encounter (HOSPITAL_COMMUNITY)
Admission: RE | Disposition: A | Discharge status: Home / Self Care | Payer: Self-pay | Source: Ambulatory Visit | Attending: Internal Medicine

## 2017-02-24 DIAGNOSIS — I4891 Unspecified atrial fibrillation: Secondary | ICD-10-CM

## 2017-02-24 DIAGNOSIS — Z8249 Family history of ischemic heart disease and other diseases of the circulatory system: Secondary | ICD-10-CM | POA: Insufficient documentation

## 2017-02-24 DIAGNOSIS — Z6835 Body mass index (BMI) 35.0-35.9, adult: Secondary | ICD-10-CM | POA: Insufficient documentation

## 2017-02-24 DIAGNOSIS — I456 Pre-excitation syndrome: Secondary | ICD-10-CM | POA: Diagnosis present

## 2017-02-24 DIAGNOSIS — Z880 Allergy status to penicillin: Secondary | ICD-10-CM | POA: Insufficient documentation

## 2017-02-24 DIAGNOSIS — E669 Obesity, unspecified: Secondary | ICD-10-CM | POA: Insufficient documentation

## 2017-02-24 HISTORY — PX: V TACH ABLATION: EP1227

## 2017-02-24 SURGERY — V TACH ABLATION

## 2017-02-24 MED ORDER — ACETAMINOPHEN 325 MG PO TABS
ORAL_TABLET | ORAL | Status: AC
  Filled 2017-02-24: qty 2

## 2017-02-24 MED ORDER — SODIUM CHLORIDE 0.9% FLUSH: 3.0000 mL | Freq: Two times a day (BID) | INTRAVENOUS | Status: DC

## 2017-02-24 MED ORDER — FENTANYL CITRATE (PF) 100 MCG/2ML IJ SOLN
INTRAMUSCULAR | Status: AC
  Filled 2017-02-24: qty 2

## 2017-02-24 MED ORDER — MAGNESIUM SULFATE 50 % IJ SOLN
INTRAMUSCULAR | Status: DC | PRN
  Administered 2017-02-24: 1 g via INTRAVENOUS

## 2017-02-24 MED ORDER — ACETAMINOPHEN 325 MG PO TABS
650.0000 mg | ORAL_TABLET | ORAL | Status: DC | PRN
  Administered 2017-02-24: 650 mg via ORAL

## 2017-02-24 MED ORDER — MIDAZOLAM HCL 5 MG/5ML IJ SOLN
INTRAMUSCULAR | Status: AC
  Filled 2017-02-24: qty 5

## 2017-02-24 MED ORDER — FLECAINIDE ACETATE 100 MG PO TABS: 100.0000 mg | ORAL_TABLET | Freq: Two times a day (BID) | ORAL | Status: DC

## 2017-02-24 MED ORDER — METOPROLOL TARTRATE 5 MG/5ML IV SOLN
INTRAVENOUS | Status: AC
  Filled 2017-02-24: qty 5

## 2017-02-24 MED ORDER — HEPARIN (PORCINE) IN NACL 2-0.9 UNIT/ML-% IJ SOLN
INTRAMUSCULAR | Status: AC | PRN
  Administered 2017-02-24: 500 mL

## 2017-02-24 MED ORDER — ONDANSETRON HCL 4 MG/2ML IJ SOLN: 4.0000 mg | Freq: Four times a day (QID) | INTRAMUSCULAR | Status: DC | PRN

## 2017-02-24 MED ORDER — ADULT MULTIVITAMIN W/MINERALS CH: 1.0000 | ORAL_TABLET | Freq: Every day | ORAL | Status: DC

## 2017-02-24 MED ORDER — IBUTILIDE FUMARATE 1 MG/10ML IV SOLN
INTRAVENOUS | Status: AC
  Filled 2017-02-24: qty 10

## 2017-02-24 MED ORDER — FENTANYL CITRATE (PF) 100 MCG/2ML IJ SOLN
INTRAMUSCULAR | Status: DC | PRN
  Administered 2017-02-24 (×4): 12.5 ug via INTRAVENOUS
  Administered 2017-02-24 (×2): 25 ug via INTRAVENOUS
  Administered 2017-02-24: 12.5 ug via INTRAVENOUS
  Administered 2017-02-24: 25 ug via INTRAVENOUS

## 2017-02-24 MED ORDER — METOPROLOL TARTRATE 50 MG PO TABS: 50.0000 mg | ORAL_TABLET | Freq: Two times a day (BID) | ORAL | Status: DC

## 2017-02-24 MED ORDER — LIDOCAINE HCL (PF) 1 % IJ SOLN
INTRAMUSCULAR | Status: DC | PRN
  Administered 2017-02-24: 45 mL via INTRADERMAL

## 2017-02-24 MED ORDER — IBUTILIDE FUMARATE 1 MG/10ML IV SOLN
INTRAVENOUS | Status: AC | PRN
  Administered 2017-02-24: 1 mg via INTRAVENOUS

## 2017-02-24 MED ORDER — MIDAZOLAM HCL 5 MG/5ML IJ SOLN
INTRAMUSCULAR | Status: DC | PRN
  Administered 2017-02-24 (×2): 1 mg via INTRAVENOUS
  Administered 2017-02-24 (×2): 2 mg via INTRAVENOUS
  Administered 2017-02-24 (×2): 1 mg via INTRAVENOUS
  Administered 2017-02-24: 2 mg via INTRAVENOUS
  Administered 2017-02-24: 3 mg via INTRAVENOUS
  Administered 2017-02-24: 2 mg via INTRAVENOUS

## 2017-02-24 MED ORDER — SODIUM CHLORIDE 0.9 % IV SOLN: 250.0000 mL | INTRAVENOUS | Status: DC | PRN

## 2017-02-24 MED ORDER — SODIUM CHLORIDE 0.9 % IV SOLN
INTRAVENOUS | Status: DC
  Administered 2017-02-24: 125 mL via INTRAVENOUS
  Administered 2017-02-24: 06:00:00 via INTRAVENOUS

## 2017-02-24 MED ORDER — LIDOCAINE HCL (PF) 1 % IJ SOLN
INTRAMUSCULAR | Status: AC
  Filled 2017-02-24: qty 60

## 2017-02-24 MED ORDER — MAGNESIUM SULFATE 50 % IJ SOLN
INTRAMUSCULAR | Status: AC
  Filled 2017-02-24: qty 2

## 2017-02-24 MED ORDER — SODIUM CHLORIDE 0.9% FLUSH: 3.0000 mL | INTRAVENOUS | Status: DC | PRN

## 2017-02-24 MED ORDER — METOPROLOL TARTRATE 5 MG/5ML IV SOLN
INTRAVENOUS | Status: DC | PRN
  Administered 2017-02-24: 5 mg via INTRAVENOUS

## 2017-02-24 SURGICAL SUPPLY — 11 items
BAG SNAP BAND KOVER 36X36 (MISCELLANEOUS) ×3 IMPLANT
CATH HEX JOSEPH 2-5-2 65CM 6F (CATHETERS) ×3 IMPLANT
CATH JOSEPHSON QUAD-ALLRED 6FR (CATHETERS) ×6 IMPLANT
PACK EP LATEX FREE (CUSTOM PROCEDURE TRAY) ×2
PACK EP LF (CUSTOM PROCEDURE TRAY) ×1 IMPLANT
PAD DEFIB LIFELINK (PAD) ×3 IMPLANT
PATCH CARTO3 (PAD) ×3 IMPLANT
SHEATH PINNACLE 6F 10CM (SHEATH) ×6 IMPLANT
SHEATH PINNACLE 7F 10CM (SHEATH) ×3 IMPLANT
SHEATH PINNACLE 8F 10CM (SHEATH) ×3 IMPLANT
SHIELD RADPAD SCOOP 12X17 (MISCELLANEOUS) ×3 IMPLANT

## 2017-02-24 NOTE — Interval H&P Note (Signed)
History and Physical Interval Note:  02/24/2017 7:31 AM  [name]  has presented today for surgery, with the diagnosis of wpw  The various methods of treatment have been discussed with the patient and family. After consideration of risks, benefits and other options for treatment, the patient has consented to  Procedure(s): V Tach Ablation WPW Ablation (N/A) as a surgical intervention .  The patient's history has been reviewed, patient examined, no change in status, stable for surgery.  I have reviewed the patient's chart and labs.  Questions were answered to the patient's satisfaction.     Lewayne Bunting

## 2017-02-24 NOTE — Discharge Instructions (Signed)

## 2017-02-24 NOTE — H&P (View-Only) (Signed)
    HPI Ms. England returns today for followup. She is an obese 51 yo woman with a h/o tachypalpitations who presented about 4 weeks ago with near syncope, chest pain and sob and was found to be in atrial fib with a very rapid VR and variable QRS morphologies worrisome for WPW. She was placed on metoprolol and flecainide. She has had minimal palpitations but feels fatigued. She notes that every day on these meds she feels wiped out by 2p.m. Minimal recurrent palpitations on medical therapy.  Allergies  Allergen Reactions  . Penicillins Shortness Of Breath, Swelling and Other (See Comments)    Swells throat  . Bee Venom Swelling and Other (See Comments)    Hard spots     Current Outpatient Prescriptions  Medication Sig Dispense Refill  . flecainide (TAMBOCOR) 100 MG tablet Take 1 tablet (100 mg total) by mouth every 12 (twelve) hours. 60 tablet 0  . metoprolol tartrate (LOPRESSOR) 50 MG tablet Take 1 tablet (50 mg total) by mouth 2 (two) times daily. 60 tablet 0   No current facility-administered medications for this visit.      Past Medical History:  Diagnosis Date  . Atrial fibrillation with RVR (HCC) 12/26/2016  . Sepsis (HCC)   . WPW (Wolff-Parkinson-White syndrome) 12/26/2016    ROS:   All systems reviewed and negative except as noted in the HPI.   Past Surgical History:  Procedure Laterality Date  . ABDOMINAL HYSTERECTOMY    . KNEE SURGERY       Family History  Problem Relation Age of Onset  . Diabetes Mellitus II Mother   . Heart failure Mother   . Heart attack Father   . Stroke Father      Social History   Social History  . Marital status: Single    Spouse name: N/A  . Number of children: N/A  . Years of education: N/A   Occupational History  . Not on file.   Social History Main Topics  . Smoking status: Never Smoker  . Smokeless tobacco: Never Used  . Alcohol use Yes     Comment: occ  . Drug use: No  . Sexual activity: Not on file    Other Topics Concern  . Not on file   Social History Narrative  . No narrative on file     BP 110/80   Pulse (!) 55   Ht 5' 9" (1.753 m)   Wt 254 lb 6.4 oz (115.4 kg)   SpO2 96%   BMI 37.57 kg/m   Physical Exam:  Well appearing 51 yo obese woman, NAD HEENT: Unremarkable Neck:  No JVD, no thyromegally Lymphatics:  No adenopathy Back:  No CVA tenderness Lungs:  Clear with no wheezes HEART:  Regular rate rhythm, no murmurs, no rubs, no clicks Abd:  soft, positive bowel sounds, no organomegally, no rebound, no guarding Ext:  2 plus pulses, no edema, no cyanosis, no clubbing Skin:  No rashes no nodules Neuro:  CN II through XII intact, motor grossly intact  EKG - NSR with no ventricular pre-excitation  Assess/Plan: 1. Atrial fib - she has had no recurrent symptoms on medical therapy. She appears to have WPW. On flecainide she has no evidence of ventricular pre-excitation. Unfortunately she has become intolerant to flecainde.  2. WPW - she appears to have pre-excitation on her 12 lead ECG when she was in atrial fib. I cannot rule out variable amounts of abherration but her VR was at times over   200/min. I have discussed the risk/benefits/goals/expectations of EP study and catheter ablation. She wishes to proceed.  3. Obesity - while her rapid ventricular rates are not due to her obesity, I strongly suspect that she will need to lose weight if she is to maintain NSR.   [name].D.

## 2017-02-24 NOTE — Progress Notes (Signed)
Site area: rt ij venous sheath Site Prior to Removal:  Level 0 Pressure Applied For: 10 minutes Manual:   yes Patient Status During Pull:  stable Post Pull Site:  Level 0 Post Pull Instructions Given:  yes Post Pull Pulses Present: na Dressing Applied:  Gauze and tegaderm Bedrest begins @  Comments:   

## 2017-02-24 NOTE — Progress Notes (Signed)
Site area: rt groin fv sheaths x3 Site Prior to Removal:  Level 0 Pressure Applied For: 20 minutes Manual:   yes Patient Status During Pull:  stable Post Pull Site:  Level 0 Post Pull Instructions Given:  yes Post Pull Pulses Present: palpable Dressing Applied:  Gauze and tegaderm Bedrest begins @ 1050 Comments: IV saline locked

## 2017-02-25 ENCOUNTER — Encounter (HOSPITAL_COMMUNITY): Payer: Self-pay | Admitting: Internal Medicine

## 2017-03-14 ENCOUNTER — Encounter: Payer: Self-pay | Admitting: Internal Medicine

## 2017-03-25 ENCOUNTER — Ambulatory Visit (INDEPENDENT_AMBULATORY_CARE_PROVIDER_SITE_OTHER): Payer: Self-pay | Admitting: Internal Medicine

## 2017-03-25 ENCOUNTER — Encounter: Payer: Self-pay | Admitting: Internal Medicine

## 2017-03-25 VITALS — BP 118/72 | HR 58 | Ht 69.0 in | Wt 260.2 lb

## 2017-03-25 DIAGNOSIS — I4891 Unspecified atrial fibrillation: Secondary | ICD-10-CM

## 2017-03-25 DIAGNOSIS — I456 Pre-excitation syndrome: Secondary | ICD-10-CM

## 2017-03-25 NOTE — Patient Instructions (Signed)

## 2017-03-25 NOTE — Progress Notes (Signed)
HPI Mrs. Denmark returns today for ongoing evaluation and management of atrial fibrillation and a rapid ventricular response. She is a very pleasant 51 year old woman who has unusually rapid atrial fibrillation with heart rates over 200 bpm. Because of what appeared to be variable QRS durations in A. fib it was anticipated that she had accessory pathway mediated atrial fibrillation and underwent EP study several weeks ago. She was found to have no accessory pathway conduction, but very rapidly conducting atrial fibrillation over her AV node. She was placed back on medical therapy and returns today for follow-up. In the interim, she has had a couple of break throughs of atrial fib. No syncope. When she had her episodes, she took an extra beta blocker and felt better.  Allergies  Allergen Reactions  . Penicillins Anaphylaxis, Shortness Of Breath and Swelling    Has patient had a PCN reaction causing immediate rash, facial/tongue/throat swelling, SOB or lightheadedness with hypotension: Yes Has patient had a PCN reaction causing severe rash involving mucus membranes or skin necrosis: No Has patient had a PCN reaction that required hospitalization: While hospitalized Has patient had a PCN reaction occurring within the last 10 years: No If all of the above answers are "NO", then may proceed with Cephalosporin use.   . Bee Venom Swelling and Other (See Comments)    Swelling and hardness at the site of bee sting.     Current Outpatient Prescriptions  Medication Sig Dispense Refill  . flecainide (TAMBOCOR) 100 MG tablet TAKE 1 TABLET BY MOUTH EVERY 12 HOURS 60 tablet 11  . ibuprofen (ADVIL,MOTRIN) 200 MG tablet Take 1,000-1,200 mg by mouth daily as needed for mild pain.    . metoprolol tartrate (LOPRESSOR) 50 MG tablet TAKE 1 TABLET BY MOUTH TWICE A DAY 60 tablet 11  . Multiple Vitamin (MULTIVITAMIN WITH MINERALS) TABS tablet Take 1 tablet by mouth daily.     No current facility-administered  medications for this visit.      Past Medical History:  Diagnosis Date  . Atrial fibrillation with RVR (HCC) 12/26/2016  . Sepsis (HCC)   . WPW (Wolff-Parkinson-White syndrome) 12/26/2016    ROS:   All systems reviewed and negative except as noted in the HPI.   Past Surgical History:  Procedure Laterality Date  . ABDOMINAL HYSTERECTOMY    . KNEE SURGERY    . V TACH ABLATION N/A 02/24/2017   Procedure: V Tach Ablation WPW Ablation;  Surgeon: Marinus Maw, MD;  Location: [facility] INVASIVE CV LAB;  Service: Cardiovascular;  Laterality: N/A;     Family History  Problem Relation Age of Onset  . Diabetes Mellitus II Mother   . Heart failure Mother   . Heart attack Father   . Stroke Father      Social History   Social History  . Marital status: Single    Spouse name: N/A  . Number of children: N/A  . Years of education: N/A   Occupational History  . Not on file.   Social History Main Topics  . Smoking status: Never Smoker  . Smokeless tobacco: Never Used  . Alcohol use Yes     Comment: occ  . Drug use: No  . Sexual activity: Not on file   Other Topics Concern  . Not on file   Social History Narrative  . No narrative on file     Pulse (!) 58   Ht  (1.753 m)   Wt 260 lb 3.2 oz (  118 kg)   SpO2 96%   BMI 38.42 kg/m   Physical Exam:  Well appearing Obese middle-aged woman, NAD HEENT: Unremarkable Neck:  6 cm  JVD, no thyromegally Lymphatics:  No adenopathy Back:  No CVA tenderness Lungs:  Clear, With no wheezes, rales, or rhonchi.  HEART:  Regular rate rhythm, no murmurs, no rubs, no clicks Abd:  soft,  obese, positive bowel sounds, no organomegally, no rebound, no guarding Ext:  2 plus pulses, no edema, no cyanosis, no clubbing Skin:  No rashes no nodules Neuro:  CN II through XII intact, motor grossly intact  EKG - Normal sinus rhythm, QRS duration 100 ms.   Assess/Plan: 1. Paroxysmal atrial fibrillation - she is reasonably well controlled  on a combination of beta blocker and flecainide. She will continue her current medications. We discussed the importance of avoidance of caffeine and alcohol, weight loss, exercise, and treatment of sleep apnea. Her husband notes that she sometimes snores at nighttime, but the patient notes that she has had her sleep apnea evaluated and found that it was not bad enough to recommend a mask. Her stroke risk remains low and she will not need anticoagulation at this time. If she develops hypertension or diabetes, then systemic anticoagulation would be recommended. 2. Obesity - weight loss is encouraged as is regular daily exercise.  Lewayne Bunting, M.D.

## 2017-11-14 ENCOUNTER — Emergency Department (HOSPITAL_COMMUNITY)
Admission: EM | Admit: 2017-11-14 | Discharge: 2017-11-14 | Discharge status: Against Medical Advice | Payer: Self-pay | Attending: Emergency Medicine | Admitting: Emergency Medicine

## 2017-11-14 ENCOUNTER — Emergency Department (HOSPITAL_COMMUNITY): Payer: Self-pay

## 2017-11-14 ENCOUNTER — Encounter (HOSPITAL_COMMUNITY): Payer: Self-pay

## 2017-11-14 ENCOUNTER — Other Ambulatory Visit: Payer: Self-pay

## 2017-11-14 DIAGNOSIS — R112 Nausea with vomiting, unspecified: Secondary | ICD-10-CM | POA: Insufficient documentation

## 2017-11-14 DIAGNOSIS — R072 Precordial pain: Secondary | ICD-10-CM | POA: Insufficient documentation

## 2017-11-14 LAB — I-STAT TROPONIN, ED: TROPONIN I, POC: 0 ng/mL (ref 0.00–0.08)

## 2017-11-14 LAB — BASIC METABOLIC PANEL
Anion gap: 8 (ref 5–15)
BUN: 10 mg/dL (ref 6–20)
CALCIUM: 9.3 mg/dL (ref 8.9–10.3)
CO2: 26 mmol/L (ref 22–32)
CREATININE: 0.84 mg/dL (ref 0.44–1.00)
Chloride: 102 mmol/L (ref 101–111)
Glucose, Bld: 114 mg/dL — ABNORMAL HIGH (ref 65–99)
Potassium: 3.8 mmol/L (ref 3.5–5.1)
SODIUM: 136 mmol/L (ref 135–145)

## 2017-11-14 LAB — CBC
HCT: 41.4 % (ref 36.0–46.0)
HEMOGLOBIN: 13.6 g/dL (ref 12.0–15.0)
MCH: 29.2 pg (ref 26.0–34.0)
MCHC: 32.9 g/dL (ref 30.0–36.0)
MCV: 88.8 fL (ref 78.0–100.0)
PLATELETS: 267 10*3/uL (ref 150–400)
RBC: 4.66 MIL/uL (ref 3.87–5.11)
RDW: 13.3 % (ref 11.5–15.5)
WBC: 22.4 10*3/uL — AB (ref 4.0–10.5)

## 2017-11-14 LAB — MAGNESIUM: MAGNESIUM: 1.8 mg/dL (ref 1.7–2.4)

## 2017-11-14 NOTE — ED Triage Notes (Signed)
Pt states that her heart started racing today and c/o CP last night.

## 2017-11-14 NOTE — ED Provider Notes (Signed)
Patient placed in Quick Look pathway, seen and evaluated   Chief Complaint: CP  HPI:   Pt is a 52 y.o. female with a PMHx of afib on flecainide and metoprolol and WPW s/p ablation, presenting today with c/o central chest pain that began yesterday with associated palpitations.  She states that when she has palpitations she was told to take an extra dose of metoprolol, she admits that today she took 2 extra doses.  She had a subjective fever earlier but that resolved with Tylenol.  She also states that this morning she had 4 episodes of emesis with associated nausea.  She denies any shortness of breath, lightheadedness, diaphoresis, abdominal pain, cough, numbness, tingling, focal weakness, increased leg swelling, or any other complaints at this time.  She is a non-smoker.  ROS: +CP, +palpitations, +subjective fever, +n/v. No shortness of breath, lightheadedness, diaphoresis, abdominal pain, cough, numbness, tingling, focal weakness, or increased leg swelling  Physical Exam:  BP 105/61 (BP Location: Right Arm)   Pulse 76   Temp 99.1 F (37.3 C) (Oral)   Resp 18   Ht  (1.727 m)   Wt 117.9 kg (260 lb)   SpO2 96%   BMI 39.53 kg/m    Gen: No distress  Neuro: Awake and Alert  Skin: Warm    Focused Exam: Heart: RRR, nl s1/s2, no m/r/g, distal pulses intact, no pitting pedal edema appreciated although legs are large due to body habitus. Pulm: CTAB in all lung fields, no w/r/r, no hypoxia or increased WOB, speaking in full sentences, SpO2 96% on RA    Initiation of care has begun. The patient has been counseled on the process, plan, and necessity for staying for the completion/evaluation, and the remainder of the medical screening examination     [address], [place], [place] 11/14/17 1654    Cardama, Amadeo Garnet, MD 11/19/17 2120

## 2017-11-14 NOTE — ED Notes (Signed)
This Pt left. Please move OTF.

## 2017-11-16 ENCOUNTER — Encounter: Payer: Self-pay | Admitting: Internal Medicine

## 2017-11-17 ENCOUNTER — Ambulatory Visit (INDEPENDENT_AMBULATORY_CARE_PROVIDER_SITE_OTHER): Payer: Self-pay | Admitting: Internal Medicine

## 2017-11-17 ENCOUNTER — Telehealth: Payer: Self-pay

## 2017-11-17 ENCOUNTER — Encounter: Payer: Self-pay | Admitting: Internal Medicine

## 2017-11-17 ENCOUNTER — Telehealth: Payer: Self-pay | Admitting: Internal Medicine

## 2017-11-17 VITALS — BP 122/70 | HR 68 | Ht 68.0 in | Wt 262.4 lb

## 2017-11-17 DIAGNOSIS — I4891 Unspecified atrial fibrillation: Secondary | ICD-10-CM

## 2017-11-17 DIAGNOSIS — R200 Anesthesia of skin: Secondary | ICD-10-CM

## 2017-11-17 DIAGNOSIS — R002 Palpitations: Secondary | ICD-10-CM

## 2017-11-17 DIAGNOSIS — I456 Pre-excitation syndrome: Secondary | ICD-10-CM

## 2017-11-17 MED ORDER — METOPROLOL TARTRATE 50 MG PO TABS: 50.0000 mg | ORAL_TABLET | Freq: Two times a day (BID) | ORAL | 11 refills | Status: DC

## 2017-11-17 MED ORDER — FLECAINIDE ACETATE 100 MG PO TABS: 100.0000 mg | ORAL_TABLET | Freq: Two times a day (BID) | ORAL | 11 refills | Status: DC

## 2017-11-17 NOTE — Progress Notes (Signed)
HPI Mrs. Denmark returns today for evaluation of atrial fib with a RVR and chest pain. She was doing well until 3 days ago when she developed chest pain and palpitations. She took an extra metoprolol twice over about 6 hours. By the time she arrived in the ED she had an ECG and had returned to NSR. She did not have syncope. Her palpitations and chest pain have been well controlled. She denies chest pain but she notes that she is also having persistence of facial numbness which began after her last episode of palpitations. Finally, she thinks she has had trouble remembering words.   Allergies  Allergen Reactions  . Penicillins Anaphylaxis, Shortness Of Breath and Swelling    Has patient had a PCN reaction causing immediate rash, facial/tongue/throat swelling, SOB or lightheadedness with hypotension: Yes Has patient had a PCN reaction causing severe rash involving mucus membranes or skin necrosis: No Has patient had a PCN reaction that required hospitalization: While hospitalized Has patient had a PCN reaction occurring within the last 10 years: No If all of the above answers are "NO", then may proceed with Cephalosporin use.   . Bee Venom Swelling and Other (See Comments)    Swelling and hardness at the site of bee sting.     Current Outpatient Medications  Medication Sig Dispense Refill  . flecainide (TAMBOCOR) 100 MG tablet TAKE 1 TABLET BY MOUTH EVERY 12 HOURS 60 tablet 11  . ibuprofen (ADVIL,MOTRIN) 200 MG tablet Take 1,000-1,200 mg by mouth daily as needed for mild pain.    . metoprolol tartrate (LOPRESSOR) 50 MG tablet TAKE 1 TABLET BY MOUTH TWICE A DAY 60 tablet 11  . Multiple Vitamin (MULTIVITAMIN WITH MINERALS) TABS tablet Take 1 tablet by mouth daily.     No current facility-administered medications for this visit.      Past Medical History:  Diagnosis Date  . Atrial fibrillation with RVR (HCC) 12/26/2016  . Sepsis (HCC)   . WPW (Wolff-Parkinson-White syndrome)  12/26/2016    ROS:   All systems reviewed and negative except as noted in the HPI.   Past Surgical History:  Procedure Laterality Date  . ABDOMINAL HYSTERECTOMY    . KNEE SURGERY    . V TACH ABLATION N/A 02/24/2017   Procedure: V Tach Ablation WPW Ablation;  Surgeon: Marinus Maw, MD;  Location[facility] INVASIVE CV LAB;  Service: Cardiovascular;  Laterality: N/A;     Family History  Problem Relation Age of Onset  . Diabetes Mellitus II Mother   . Heart failure Mother   . Heart attack Father   . Stroke Father      Social History   Socioeconomic History  . Marital status: Single    Spouse name: Not on file  . Number of children: Not on file  . Years of education: Not on file  . Highest education level: Not on file  Occupational History  . Not on file  Social Needs  . Financial resource strain: Not on file  . Food insecurity:    Worry: Not on file    Inability: Not on file  . Transportation needs:    Medical: Not on file    Non-medical: Not on file  Tobacco Use  . Smoking status: Never Smoker  . Smokeless tobacco: Never Used  Substance and Sexual Activity  . Alcohol use: Yes    Comment: occ  . Drug use: No  . Sexual activity: Not on file  Lifestyle  .  Physical activity:    Days per week: Not on file    Minutes per session: Not on file  . Stress: Not on file  Relationships  . Social connections:    Talks on phone: Not on file    Gets together: Not on file    Attends religious service: Not on file    Active member of club or organization: Not on file    Attends meetings of clubs or organizations: Not on file    Relationship status: Not on file  . Intimate partner violence:    Fear of current or ex partner: Not on file    Emotionally abused: Not on file    Physically abused: Not on file    Forced sexual activity: Not on file  Other Topics Concern  . Not on file  Social History Narrative  . Not on file     BP 122/70   Pulse 68   Ht  (1.727 m)    Wt 262 lb 6.4 oz (119 kg)   SpO2 95%   BMI 39.90 kg/m   Physical Exam:  Well appearing overweight 52 yo woman, NAD HEENT: Unremarkable Neck:  7cm JVD, no thyromegally Lymphatics:  No adenopathy Back:  No CVA tenderness Lungs:  Clear with no wheezes HEART:  Regular rate rhythm, no murmurs, no rubs, no clicks Abd:  soft, positive bowel sounds, no organomegally, no rebound, no guarding Ext:  2 plus pulses, no edema, no cyanosis, no clubbing Skin:  No rashes no nodules Neuro:  CN II through XII intact, motor grossly intact  EKG - none  Assess/Plan: 1. PAF - she is really not having a lot of symptoms although the episode that resulted in her ED visit almost certainly was caused by atrial fib. I have asked her to take an additional dose of beta blocker when she experiences the atrial fib and if she still has more then to take some flecainide. 2. Precordial chest pain - her enzymes were negative and I strongly suspect this was supply/demand mismatch. No additional workup at this time.  3. Coags - she is CHADSVASC score is 1. I suspect she will end up having DM sooner than later. 4. Facial numbness - I doubt but cannot rule out a small stroke and I have recommended she undergo MRI of the head.   Leonia Reeves.D.

## 2017-11-17 NOTE — Telephone Encounter (Signed)
Call placed to Pt.  Pt can come in today at 11:45 am.  No further action needed.

## 2017-11-17 NOTE — Patient Instructions (Addendum)
Medication Instructions:  Your physician has recommended you make the following change in your medication:  1.  If you feel your heart racing for longer than 30 minutes=take an additional tab of your metoprolol 2.  If your heart continues to race after the metoprolol for an hour=take a 1/2 tablet of flecainide  Labwork: None ordered.  Testing/Procedures: You will be scheduled for a head MRI.  You will be scheduled for a 48 hour holter monitor.  Follow-Up: Your physician wants you to follow-up in: 6 weeks with Dr. Ladona Ridgel.    Any Other Special Instructions Will Be Listed Below (If Applicable).  If you need a refill on your cardiac medications before your next appointment, please call your pharmacy.

## 2017-11-17 NOTE — Telephone Encounter (Signed)
Called patient and LVM regarding appointment for brain MR at [facility] on 11-18-17 at 4 p.m.,with arrival at the hospital at 3:30 to the MRI department.

## 2017-11-18 ENCOUNTER — Ambulatory Visit (HOSPITAL_COMMUNITY)
Admission: RE | Admit: 2017-11-18 | Discharge: 2017-11-18 | Disposition: A | Discharge status: Home / Self Care | Payer: Self-pay | Source: Ambulatory Visit | Attending: Internal Medicine | Admitting: Internal Medicine

## 2017-11-18 DIAGNOSIS — R200 Anesthesia of skin: Secondary | ICD-10-CM | POA: Insufficient documentation

## 2017-11-18 MED ORDER — GADOBENATE DIMEGLUMINE 529 MG/ML IV SOLN
20.0000 mL | Freq: Once | INTRAVENOUS | Status: AC | PRN
  Administered 2017-11-18: 20 mL via INTRAVENOUS

## 2017-11-21 ENCOUNTER — Ambulatory Visit (INDEPENDENT_AMBULATORY_CARE_PROVIDER_SITE_OTHER): Payer: Self-pay

## 2017-11-21 ENCOUNTER — Other Ambulatory Visit: Payer: Self-pay | Admitting: Internal Medicine

## 2017-11-21 DIAGNOSIS — I456 Pre-excitation syndrome: Secondary | ICD-10-CM

## 2017-11-21 DIAGNOSIS — R002 Palpitations: Secondary | ICD-10-CM

## 2017-11-21 DIAGNOSIS — I4891 Unspecified atrial fibrillation: Secondary | ICD-10-CM

## 2017-11-26 ENCOUNTER — Ambulatory Visit: Payer: Self-pay | Admitting: Internal Medicine

## 2017-12-29 ENCOUNTER — Ambulatory Visit (INDEPENDENT_AMBULATORY_CARE_PROVIDER_SITE_OTHER): Payer: Self-pay | Admitting: Internal Medicine

## 2017-12-29 ENCOUNTER — Encounter: Payer: Self-pay | Admitting: Internal Medicine

## 2017-12-29 VITALS — BP 122/68 | HR 55 | Ht 68.0 in | Wt 272.0 lb

## 2017-12-29 DIAGNOSIS — I456 Pre-excitation syndrome: Secondary | ICD-10-CM

## 2017-12-29 DIAGNOSIS — I4891 Unspecified atrial fibrillation: Secondary | ICD-10-CM

## 2017-12-29 MED ORDER — FLECAINIDE ACETATE 100 MG PO TABS: 100.0000 mg | ORAL_TABLET | Freq: Two times a day (BID) | ORAL | 11 refills | Status: DC

## 2017-12-29 MED ORDER — METOPROLOL TARTRATE 50 MG PO TABS: 50.0000 mg | ORAL_TABLET | Freq: Two times a day (BID) | ORAL | 11 refills | Status: DC

## 2017-12-29 NOTE — Progress Notes (Signed)
HPI Mrs. Denmark returns today for evaluation of atrial fib with a RVR and chest pain. She underwent EP study last year as she appeared to be pre-excited when she was in atrial fib but was found to have no evidence of an AP. She wore a cardiac monitor and was found to have PAC's but no atrial fib. Since I saw her last she has been maintained on a combination of flecainide 100 mg twice daily and metoprolol. She has had occaisional palpitations. No syncope. No edema. She is walking 2 miles a day.  Allergies  Allergen Reactions  . Penicillins Anaphylaxis, Shortness Of Breath and Swelling    Has patient had a PCN reaction causing immediate rash, facial/tongue/throat swelling, SOB or lightheadedness with hypotension: Yes Has patient had a PCN reaction causing severe rash involving mucus membranes or skin necrosis: No Has patient had a PCN reaction that required hospitalization: While hospitalized Has patient had a PCN reaction occurring within the last 10 years: No If all of the above answers are "NO", then may proceed with Cephalosporin use.   . Bee Venom Swelling and Other (See Comments)    Swelling and hardness at the site of bee sting.     Current Outpatient Medications  Medication Sig Dispense Refill  . flecainide (TAMBOCOR) 100 MG tablet Take 1 tablet (100 mg total) by mouth every 12 (twelve) hours. 60 tablet 11  . ibuprofen (ADVIL,MOTRIN) 200 MG tablet Take 1,000-1,200 mg by mouth daily as needed for mild pain.    . metoprolol tartrate (LOPRESSOR) 50 MG tablet Take 1 tablet (50 mg total) by mouth 2 (two) times daily. 60 tablet 11  . Multiple Vitamin (MULTIVITAMIN WITH MINERALS) TABS tablet Take 1 tablet by mouth daily.     No current facility-administered medications for this visit.      Past Medical History:  Diagnosis Date  . Atrial fibrillation with RVR (HCC) 12/26/2016  . Sepsis (HCC)   . WPW (Wolff-Parkinson-White syndrome) 12/26/2016    ROS:   All systems  reviewed and negative except as noted in the HPI.   Past Surgical History:  Procedure Laterality Date  . ABDOMINAL HYSTERECTOMY    . KNEE SURGERY    . V TACH ABLATION N/A 02/24/2017   Procedure: V Tach Ablation WPW Ablation;  Surgeon: Marinus Maw, MD;  Location: [facility] INVASIVE CV LAB;  Service: Cardiovascular;  Laterality: N/A;     Family History  Problem Relation Age of Onset  . Diabetes Mellitus II Mother   . Heart failure Mother   . Heart attack Father   . Stroke Father      Social History   Socioeconomic History  . Marital status: Single    Spouse name: Not on file  . Number of children: Not on file  . Years of education: Not on file  . Highest education level: Not on file  Occupational History  . Not on file  Social Needs  . Financial resource strain: Not on file  . Food insecurity:    Worry: Not on file    Inability: Not on file  . Transportation needs:    Medical: Not on file    Non-medical: Not on file  Tobacco Use  . Smoking status: Never Smoker  . Smokeless tobacco: Never Used  Substance and Sexual Activity  . Alcohol use: Yes    Comment: occ  . Drug use: No  . Sexual activity: Not on file  Lifestyle  . Physical activity:  Days per week: Not on file    Minutes per session: Not on file  . Stress: Not on file  Relationships  . Social connections:    Talks on phone: Not on file    Gets together: Not on file    Attends religious service: Not on file    Active member of club or organization: Not on file    Attends meetings of clubs or organizations: Not on file    Relationship status: Not on file  . Intimate partner violence:    Fear of current or ex partner: Not on file    Emotionally abused: Not on file    Physically abused: Not on file    Forced sexual activity: Not on file  Other Topics Concern  . Not on file  Social History Narrative  . Not on file     BP 122/68   Pulse (!) 55   Ht 5\' 8"  (1.727 m)   Wt 272 lb (123.4 kg)   SpO2  96%   BMI 41.36 kg/m   Physical Exam:  Well appearing NAD HEENT: Unremarkable Neck:  No JVD, no thyromegally Lymphatics:  No adenopathy Back:  No CVA tenderness Lungs:  Clear with no wheezes HEART:  Regular rate rhythm, no murmurs, no rubs, no clicks Abd:  soft, positive bowel sounds, no organomegally, no rebound, no guarding Ext:  2 plus pulses, no edema, no cyanosis, no clubbing Skin:  No rashes no nodules Neuro:  CN II through XII intact, motor grossly intact  EKG - sinus bradycardia and LAE  Assess/Plan: 1. PAF - she is maintaining NSR on flecainide and beta blocker therapy. CHADSVASC is one. She has neither HTN or DM. 2. Obesity - I encouraged her to increase her activity and consider obtaining a Fit bit to quantify her activity.  Leonia ReevesGregg Taylor,M.D.

## 2017-12-29 NOTE — Patient Instructions (Signed)

## 2019-01-02 ENCOUNTER — Other Ambulatory Visit: Payer: Self-pay | Admitting: Internal Medicine

## 2019-01-30 ENCOUNTER — Other Ambulatory Visit: Payer: Self-pay | Admitting: Internal Medicine

## 2019-02-11 ENCOUNTER — Other Ambulatory Visit: Payer: Self-pay | Admitting: Internal Medicine

## 2019-02-11 MED ORDER — METOPROLOL TARTRATE 50 MG PO TABS: 50.0000 mg | ORAL_TABLET | Freq: Two times a day (BID) | ORAL | 0 refills | Status: DC

## 2019-02-11 NOTE — Telephone Encounter (Signed)
° ° ° °*  STAT* If patient is at the pharmacy, call can be transferred to refill team.   1. Which medications need to be refilled? (please list name of each medication and dose if known) metoprolol tartrate (LOPRESSOR) 50 MG tablet,  2. Which pharmacy/location (including street and city if local pharmacy) is medication to be sent to? [facility] [place]  3. Do they need a 30 day or 90 day supply? [place]

## 2019-02-11 NOTE — Telephone Encounter (Signed)
Pt's medication was sent to pt's pharmacy as requested. Confirmation received.  °

## 2019-02-26 ENCOUNTER — Other Ambulatory Visit: Payer: Self-pay | Admitting: Internal Medicine

## 2019-05-01 ENCOUNTER — Other Ambulatory Visit: Payer: Self-pay | Admitting: Internal Medicine

## 2019-05-04 ENCOUNTER — Encounter: Payer: Self-pay | Admitting: Internal Medicine

## 2019-05-04 ENCOUNTER — Other Ambulatory Visit: Payer: Self-pay

## 2019-05-04 ENCOUNTER — Ambulatory Visit (INDEPENDENT_AMBULATORY_CARE_PROVIDER_SITE_OTHER): Payer: Self-pay | Admitting: Internal Medicine

## 2019-05-04 DIAGNOSIS — I480 Paroxysmal atrial fibrillation: Secondary | ICD-10-CM | POA: Insufficient documentation

## 2019-05-04 MED ORDER — FLECAINIDE ACETATE 100 MG PO TABS: 100.0000 mg | ORAL_TABLET | Freq: Two times a day (BID) | ORAL | 3 refills | Status: DC

## 2019-05-04 MED ORDER — FLECAINIDE ACETATE 100 MG PO TABS: 100.0000 mg | ORAL_TABLET | Freq: Two times a day (BID) | ORAL | 11 refills | Status: DC

## 2019-05-04 MED ORDER — METOPROLOL TARTRATE 50 MG PO TABS: 50.0000 mg | ORAL_TABLET | Freq: Two times a day (BID) | ORAL | 3 refills | Status: DC

## 2019-05-04 NOTE — Progress Notes (Signed)
HPI Ms. Mayotte returns today for followup. She is a pleasant 53 yo woman with a h/o atrial fib with a RVR, obesity and HTN. She has been on flecainide and metoprolol. She has had some positional dizziness. She has palpitations occurring about 3 times a week, lasting no more than 10 minutes. She denies medical non-compliance.  Allergies  Allergen Reactions  . Penicillins Anaphylaxis, Shortness Of Breath and Swelling    Has patient had a PCN reaction causing immediate rash, facial/tongue/throat swelling, SOB or lightheadedness with hypotension: Yes Has patient had a PCN reaction causing severe rash involving mucus membranes or skin necrosis: No Has patient had a PCN reaction that required hospitalization: While hospitalized Has patient had a PCN reaction occurring within the last 10 years: No If all of the above answers are "NO", then may proceed with Cephalosporin use.   . Bee Venom Swelling and Other (See Comments)    Swelling and hardness at the site of bee sting.     Current Outpatient Medications  Medication Sig Dispense Refill  . flecainide (TAMBOCOR) 100 MG tablet Take 1 tablet every 12 hours. Please keep your appt with Dr. Lovena Le 05/2019 for further refills 60 tablet 2  . ibuprofen (ADVIL,MOTRIN) 200 MG tablet Take 1,000-1,200 mg by mouth daily as needed for mild pain.    . metoprolol tartrate (LOPRESSOR) 50 MG tablet Take 1 tablet (50 mg total) by mouth 2 (two) times daily. Please keep upcoming appt in November for future refills. Thank you 180 tablet 0  . Multiple Vitamin (MULTIVITAMIN WITH MINERALS) TABS tablet Take 1 tablet by mouth daily.     No current facility-administered medications for this visit.      Past Medical History:  Diagnosis Date  . Atrial fibrillation with RVR ([place]) 12/26/2016  . Sepsis ([place])   . WPW (Wolff-Parkinson-White syndrome) 12/26/2016    ROS:   All systems reviewed and negative except as noted in the HPI.   Past Surgical History:   Procedure Laterality Date  . ABDOMINAL HYSTERECTOMY    . KNEE SURGERY    . V TACH ABLATION N/A 02/24/2017   Procedure: V Tach Ablation WPW Ablation;  Surgeon: Evans Lance, MD;  Location: [facility];  Service: Cardiovascular;  Laterality: N/A;     Family History  Problem Relation Age of Onset  . Diabetes Mellitus II Mother   . Heart failure Mother   . Heart attack Father   . Stroke Father      Social History   Socioeconomic History  . Marital status: Single    Spouse name: Not on file  . Number of children: Not on file  . Years of education: Not on file  . Highest education level: Not on file  Occupational History  . Not on file  Social Needs  . Financial resource strain: Not on file  . Food insecurity    Worry: Not on file    Inability: Not on file  . Transportation needs    Medical: Not on file    Non-medical: Not on file  Tobacco Use  . Smoking status: Never Smoker  . Smokeless tobacco: Never Used  Substance and Sexual Activity  . Alcohol use: Yes    Comment: occ  . Drug use: No  . Sexual activity: Not on file  Lifestyle  . Physical activity    Days per week: Not on file    Minutes per session: Not on file  . Stress: Not  on file  Relationships  . Social Musician on phone: Not on file    Gets together: Not on file    Attends religious service: Not on file    Active member of club or organization: Not on file    Attends meetings of clubs or organizations: Not on file    Relationship status: Not on file  . Intimate partner violence    Fear of current or ex partner: Not on file    Emotionally abused: Not on file    Physically abused: Not on file    Forced sexual activity: Not on file  Other Topics Concern  . Not on file  Social History Narrative  . Not on file     BP 140/80   Pulse 68   Ht 5\' 8"  (1.727 m)   Wt 268 lb (121.6 kg)   SpO2 96%   BMI 40.75 kg/m   Physical Exam:  Well appearing NAD HEENT: Unremarkable  Neck:  No JVD, no thyromegally Lymphatics:  No adenopathy Back:  No CVA tenderness Lungs:  Clear with no wheezes HEART:  Regular rate rhythm, no murmurs, no rubs, no clicks Abd:  soft, positive bowel sounds, no organomegally, no rebound, no guarding Ext:  2 plus pulses, no edema, no cyanosis, no clubbing Skin:  No rashes no nodules Neuro:  CN II through XII intact, motor grossly intact  EKG - nsr with nml axis  Assess/Plan: 1. PAF - her symptoms are fairly well controlled.  2. Obesity - I asked her to lose a pound a month. She states that she plans to lose more. 3. HTN - her bp is fairly well controlled.   .D.

## 2019-05-04 NOTE — Patient Instructions (Signed)

## 2020-02-02 ENCOUNTER — Other Ambulatory Visit: Payer: Self-pay

## 2020-02-02 ENCOUNTER — Encounter (HOSPITAL_BASED_OUTPATIENT_CLINIC_OR_DEPARTMENT_OTHER): Payer: Self-pay | Admitting: *Deleted

## 2020-02-02 ENCOUNTER — Emergency Department (HOSPITAL_BASED_OUTPATIENT_CLINIC_OR_DEPARTMENT_OTHER)
Admission: EM | Admit: 2020-02-02 | Discharge: 2020-02-02 | Disposition: A | Discharge status: Home / Self Care | Payer: Self-pay | Attending: Emergency Medicine | Admitting: Emergency Medicine

## 2020-02-02 ENCOUNTER — Emergency Department (HOSPITAL_BASED_OUTPATIENT_CLINIC_OR_DEPARTMENT_OTHER): Payer: Self-pay

## 2020-02-02 DIAGNOSIS — R0789 Other chest pain: Secondary | ICD-10-CM | POA: Insufficient documentation

## 2020-02-02 DIAGNOSIS — R002 Palpitations: Secondary | ICD-10-CM | POA: Insufficient documentation

## 2020-02-02 LAB — BASIC METABOLIC PANEL
Anion gap: 12 (ref 5–15)
BUN: 14 mg/dL (ref 6–20)
CO2: 28 mmol/L (ref 22–32)
Calcium: 9 mg/dL (ref 8.9–10.3)
Chloride: 100 mmol/L (ref 98–111)
Creatinine, Ser: 0.61 mg/dL (ref 0.44–1.00)
GFR calc Af Amer: >60 mL/min (ref >60)
GFR calc non Af Amer: >60 mL/min (ref >60)
Glucose, Bld: 131 mg/dL — ABNORMAL HIGH (ref 70–99)
Potassium: 3.6 mmol/L (ref 3.5–5.1)
Sodium: 140 mmol/L (ref 135–145)

## 2020-02-02 LAB — CBC WITH DIFFERENTIAL/PLATELET
Abs Immature Granulocytes: 0.09 10*3/uL — ABNORMAL HIGH (ref 0.00–0.07)
Basophils Absolute: 0 10*3/uL (ref 0.0–0.1)
Basophils Relative: 1 %
Eosinophils Absolute: 0.4 10*3/uL (ref 0.0–0.5)
Eosinophils Relative: 4 %
HCT: 39.8 % (ref 36.0–46.0)
Hemoglobin: 13.6 g/dL (ref 12.0–15.0)
Immature Granulocytes: 1 %
Lymphocytes Relative: 25 %
Lymphs Abs: 2.1 10*3/uL (ref 0.7–4.0)
MCH: 30.2 pg (ref 26.0–34.0)
MCHC: 34.2 g/dL (ref 30.0–36.0)
MCV: 88.4 fL (ref 80.0–100.0)
Monocytes Absolute: 0.6 10*3/uL (ref 0.1–1.0)
Monocytes Relative: 7 %
Neutro Abs: 5.2 10*3/uL (ref 1.7–7.7)
Neutrophils Relative %: 62 %
Platelets: 245 10*3/uL (ref 150–400)
RBC: 4.5 MIL/uL (ref 3.87–5.11)
RDW: 13.5 % (ref 11.5–15.5)
WBC: 8.3 10*3/uL (ref 4.0–10.5)
nRBC: 0 % (ref 0.0–0.2)

## 2020-02-02 LAB — TROPONIN I (HIGH SENSITIVITY)
Troponin I (High Sensitivity): 5 ng/L (ref <18)
Troponin I (High Sensitivity): 6 ng/L (ref <18)

## 2020-02-02 MED ORDER — ASPIRIN 81 MG PO CHEW
324.0000 mg | CHEWABLE_TABLET | Freq: Once | ORAL | Status: AC
  Administered 2020-02-02: 324 mg via ORAL
  Filled 2020-02-02: qty 4

## 2020-02-02 NOTE — ED Notes (Signed)
ED Provider at bedside. 

## 2020-02-02 NOTE — ED Triage Notes (Signed)
Chest pain started at 0530 this morning.  Took Metoprolol & Flecainide with no relief.

## 2020-02-02 NOTE — ED Provider Notes (Signed)
[facility] EMERGENCY DEPARTMENT Provider Note  CSN: [id number] Arrival date & time: 02/02/20 0747    History Chief Complaint  Patient presents with  . Chest Pain    HPI  America Denmark is a 54 y.o. female with a history of afib/RVR and WPW who had an EP study in 2018 that did not demonstrate an accessory pathway but did show inducible atrial fibrillation. She has been maintained on metoprolol and flecainide for the last several years. This morning she woke up in her normal state of health, but after taking a shower started feeling her heart racing and some chest pressure. She had some tingling in her hands then as well. This was before her normal morning dose of medications and so she took her metoprolol and flecainide, but symptoms did not improve. She states she has been told by her Cardiologist (Dr. Ladona Ridgel) that she can take up to three additional metoprolol and one additional flecainide as needed and so she did that before coming to the ED. HR has since improved, but still reports some mild midternal chest pressure, not radiating. Some SOB earlier this morning but none now.    Past Medical History:  Diagnosis Date  . Atrial fibrillation with RVR (HCC) 12/26/2016  . Sepsis (HCC)   . WPW (Wolff-Parkinson-White syndrome) 12/26/2016    Past Surgical History:  Procedure Laterality Date  . ABDOMINAL HYSTERECTOMY    . KNEE SURGERY    . V TACH ABLATION N/A 02/24/2017   Procedure: V Tach Ablation WPW Ablation;  Surgeon: Marinus Maw, MD;  Location: [facility] INVASIVE CV LAB;  Service: Cardiovascular;  Laterality: N/A;    Family History  Problem Relation Age of Onset  . Diabetes Mellitus II Mother   . Heart failure Mother   . Heart attack Father   . Stroke Father     Social History   Tobacco Use  . Smoking status: Never Smoker  . Smokeless tobacco: Never Used  Vaping Use  . Vaping Use: Never used  Substance Use Topics  . Alcohol use: Yes    Comment: occ  . Drug  use: No     Home Medications Prior to Admission medications   Medication Sig Start Date End Date Taking? Authorizing Provider  flecainide (TAMBOCOR) 100 MG tablet Take 1 tablet (100 mg total) by mouth 2 (two) times daily. 05/04/19  Yes Marinus Maw, MD  metoprolol tartrate (LOPRESSOR) 50 MG tablet Take 1 tablet (50 mg total) by mouth 2 (two) times daily. 05/04/19  Yes Marinus Maw, MD  Multiple Vitamin (MULTIVITAMIN WITH MINERALS) TABS tablet Take 1 tablet by mouth daily.   Yes [provider]  ibuprofen (ADVIL,MOTRIN) 200 MG tablet Take 1,000-1,200 mg by mouth daily as needed for mild pain.    [provider]     Allergies    Penicillins and Bee venom   Review of Systems   Review of Systems A comprehensive review of systems was completed and negative except as noted in HPI.    Physical Exam BP 101/69 (BP Location: Left Arm)   Pulse (!) 57   Temp 98 F (36.7 C) (Oral)   Resp 17   Ht 5\' 9"  (1.753 m)   Wt 113.4 kg   SpO2 98%   BMI 36.92 kg/m   Physical Exam Vitals and nursing note reviewed.  Constitutional:      Appearance: Normal appearance.  HENT:     Head: Normocephalic and atraumatic.     Nose:  Nose normal.     Mouth/Throat:     Mouth: Mucous membranes are moist.  Eyes:     Extraocular Movements: Extraocular movements intact.     Conjunctiva/sclera: Conjunctivae normal.  Cardiovascular:     Rate and Rhythm: Normal rate.  Pulmonary:     Effort: Pulmonary effort is normal.     Breath sounds: Normal breath sounds.  Abdominal:     General: Abdomen is flat.     Palpations: Abdomen is soft.     Tenderness: There is no abdominal tenderness.  Musculoskeletal:        General: No swelling. Normal range of motion.     Cervical back: Neck supple.  Skin:    General: Skin is warm and dry.  Neurological:     General: No focal deficit present.     Mental Status: She is alert.  Psychiatric:        Mood and Affect: Mood normal.      ED  Results / Procedures / Treatments   Labs (all labs ordered are listed, but only abnormal results are displayed) Labs Reviewed  BASIC METABOLIC PANEL - Abnormal; Notable for the following components:      Result Value   Glucose, Bld 131 (*)    All other components within normal limits  CBC WITH DIFFERENTIAL/PLATELET - Abnormal; Notable for the following components:   Abs Immature Granulocytes 0.09 (*)    All other components within normal limits  TROPONIN I (HIGH SENSITIVITY)  TROPONIN I (HIGH SENSITIVITY)    EKG EKG Interpretation  Date/Time:  Wednesday February 02 2020 08:02:45 EDT Ventricular Rate:  74 PR Interval:    QRS Duration: 109 QT Interval:  403 QTC Calculation: 448 R Axis:   49 Text Interpretation: Sinus rhythm Baseline wander in lead(s) V3 V5 No significant change since last tracing Confirmed by Susy Frizzle [phone]) on 02/02/2020 8:13:12 AM    Radiology DG Chest 2 View  Result Date: 02/02/2020 CLINICAL DATA:  Chest pain EXAM: CHEST - 2 VIEW COMPARISON:  11/14/2017 chest radiograph. FINDINGS: Hypoinflated lungs. Bilateral perihilar and bibasilar linear opacities. No pneumothorax or pleural effusion. Cardiomegaly. Multilevel spondylosis. IMPRESSION: Hypoinflated lungs with bilateral perihilar and bibasilar linear opacities, likely atelectasis. Cardiomegaly. Electronically Signed   By: Stana Bunting M.D.   On: 02/02/2020 08:37    Procedures Procedures  Medications Ordered in the ED Medications  aspirin chewable tablet 324 mg (324 mg Oral Given 02/02/20 0903)     MDM Rules/Calculators/A&P MDM Patient with history of Afib no known CAD here after an episode of tachycardia this morning that has since resolved. Still some mild residual chest pressure. Will check labs including CBC, BMP and Trop. CXR. EKG unremarkable.  ED Course  I have reviewed the triage vital signs and the nursing notes.  Pertinent labs & imaging results that were available during my care  of the patient were reviewed by me and considered in my medical decision making (see chart for details).  Clinical Course as of Feb 01 1229  Wed Feb 02, 2020  0827 CBC is normal.    [CS]  0846 CXR without acute process. BMP is normal.    [CS]  0903 First Trop neg.    [CS]  1218 Patient's HR has remained well controlled. Trop neg x 2. Low concern for ACS. Recommend continue home meds, followup with Cards and RTED for any other concerns.    [CS]    Clinical Course User Index [CS] Pollyann Savoy, MD  Final Clinical Impression(s) / ED Diagnoses Final diagnoses:  Atypical chest pain  Palpitations    Rx / DC Orders ED Discharge Orders    None       Pollyann Savoy, MD 02/02/20 1230

## 2020-05-23 ENCOUNTER — Ambulatory Visit (INDEPENDENT_AMBULATORY_CARE_PROVIDER_SITE_OTHER): Payer: Self-pay | Admitting: Internal Medicine

## 2020-05-23 ENCOUNTER — Other Ambulatory Visit: Payer: Self-pay

## 2020-05-23 ENCOUNTER — Encounter: Payer: Self-pay | Admitting: Internal Medicine

## 2020-05-23 VITALS — BP 110/68 | HR 51 | Ht 69.0 in | Wt 271.0 lb

## 2020-05-23 DIAGNOSIS — I480 Paroxysmal atrial fibrillation: Secondary | ICD-10-CM

## 2020-05-23 MED ORDER — FLECAINIDE ACETATE 100 MG PO TABS: 100.0000 mg | ORAL_TABLET | Freq: Two times a day (BID) | ORAL | 11 refills | Status: DC

## 2020-05-23 MED ORDER — METOPROLOL TARTRATE 50 MG PO TABS: 50.0000 mg | ORAL_TABLET | Freq: Two times a day (BID) | ORAL | 3 refills | Status: DC

## 2020-05-23 MED ORDER — NITROGLYCERIN 0.4 MG SL SUBL: SUBLINGUAL_TABLET | SUBLINGUAL | 1 refills | Status: DC

## 2020-05-23 NOTE — Progress Notes (Signed)
HPI [name] for followup. She is a pleasant 54 yo woman with a h/o atrial fib with a RVR, obesity and HTN. She has been on flecainide and metoprolol. She has had some positional dizziness. She has palpitations occurring about a dozen times in the past year, lasting no more than 30 minutes. She has had one episode which did not stop and prompted her to seek medical attention. She eventually spontaneously went back to NSR. She also notes episodes of intermittent CP which is non-exertional and associated with no specific activity. She admits to some dietary indiscretion and has not lost weight in the past several months.  Allergies  Allergen Reactions  . Penicillins Anaphylaxis, Shortness Of Breath and Swelling    Has patient had a PCN reaction causing immediate rash, facial/tongue/throat swelling, SOB or lightheadedness with hypotension: Yes Has patient had a PCN reaction causing severe rash involving mucus membranes or skin necrosis: No Has patient had a PCN reaction that required hospitalization: While hospitalized Has patient had a PCN reaction occurring within the last 10 years: No If all of the above answers are "NO", then may proceed with Cephalosporin use.   . Bee Venom Swelling and Other (See Comments)    Swelling and hardness at the site of bee sting.     Current Outpatient Medications  Medication Sig Dispense Refill  . flecainide (TAMBOCOR) 100 MG tablet Take 1 tablet (100 mg total) by mouth 2 (two) times daily. 60 tablet 11  . ibuprofen (ADVIL,MOTRIN) 200 MG tablet Take 1,000-1,200 mg by mouth daily as needed for mild pain.    . metoprolol tartrate (LOPRESSOR) 50 MG tablet Take 1 tablet (50 mg total) by mouth 2 (two) times daily. 180 tablet 3  . Multiple Vitamin (MULTIVITAMIN WITH MINERALS) TABS tablet Take 1 tablet by mouth daily.     No current facility-administered medications for this visit.     Past Medical History:  Diagnosis Date  . Atrial  fibrillation with RVR (HCC) 12/26/2016  . Sepsis (HCC)   . WPW (Wolff-Parkinson-White syndrome) 12/26/2016    ROS:   All systems reviewed and negative except as noted in the HPI.   Past Surgical History:  Procedure Laterality Date  . ABDOMINAL HYSTERECTOMY    . KNEE SURGERY    . V TACH ABLATION N/A 02/24/2017   Procedure: V Tach Ablation WPW Ablation;  Surgeon: Marinus Maw, MD;  Location: [facility] INVASIVE CV LAB;  Service: Cardiovascular;  Laterality: N/A;     Family History  Problem Relation Age of Onset  . Diabetes Mellitus II Mother   . Heart failure Mother   . Heart attack Father   . Stroke Father      Social History   Socioeconomic History  . Marital status: Single    Spouse name: Not on file  . Number of children: Not on file  . Years of education: Not on file  . Highest education level: Not on file  Occupational History  . Not on file  Tobacco Use  . Smoking status: Never Smoker  . Smokeless tobacco: Never Used  Vaping Use  . Vaping Use: Never used  Substance and Sexual Activity  . Alcohol use: Yes    Comment: occ  . Drug use: No  . Sexual activity: Not on file  Other Topics Concern  . Not on file  Social History Narrative  . Not on file   Social Determinants of Health   Financial Resource Strain:   .  Difficulty of Paying Living Expenses: Not on file  Food Insecurity:   . Worried About Programme researcher, broadcasting/film/video in the Last Year: Not on file  . Ran Out of Food in the Last Year: Not on file  Transportation Needs:   . Lack of Transportation (Medical): Not on file  . Lack of Transportation (Non-Medical): Not on file  Physical Activity:   . Days of Exercise per Week: Not on file  . Minutes of Exercise per Session: Not on file  Stress:   . Feeling of Stress : Not on file  Social Connections:   . Frequency of Communication with Friends and Family: Not on file  . Frequency of Social Gatherings with Friends and Family: Not on file  . Attends Religious  Services: Not on file  . Active Member of Clubs or Organizations: Not on file  . Attends Banker Meetings: Not on file  . Marital Status: Not on file  Intimate Partner Violence:   . Fear of Current or Ex-Partner: Not on file  . Emotionally Abused: Not on file  . Physically Abused: Not on file  . Sexually Abused: Not on file     BP 110/68   Pulse (!) 51   Ht 5\' 9"  (1.753 m)   Wt 271 lb (122.9 kg)   SpO2 94%   BMI 40.02 kg/m   Physical Exam:  Overweight but well appearing NAD HEENT: Unremarkable Neck:  6 cm JVD, no thyromegally Lymphatics:  No adenopathy Back:  No CVA tenderness Lungs:  Clear with no wheezes HEART:  Regular rate rhythm, no murmurs, no rubs, no clicks Abd:  soft, positive bowel sounds, no organomegally, no rebound, no guarding Ext:  2 plus pulses, no edema, no cyanosis, no clubbing Skin:  No rashes no nodules Neuro:  CN II through XII intact, motor grossly intact  EKG - NSR   Assess/Plan: 1. PAF - she is mostly controlled. I discussed the treatment options with the patient. She will continue her flecainide and beta blocker 2. Obesity - I encouraged her to avoid salty foods.  Grayce Budden,MD

## 2020-05-23 NOTE — Patient Instructions (Addendum)
Medication Instructions:  Your physician has recommended you make the following change in your medication:   1.  Take nitroglycerin 0.4 sublingual tablet- Take one tablet by mouth as needed for exertional chest discomfort.   Labwork: None ordered.  Testing/Procedures: None ordered.  Follow-Up: Your physician wants you to follow-up in: one year with Dr. Ladona Ridgel.   You will receive a reminder letter in the mail two months in advance. If you don't receive a letter, please call our office to schedule the follow-up appointment.   Any Other Special Instructions Will Be Listed Below (If Applicable).  If you need a refill on your cardiac medications before your next appointment, please call your pharmacy.

## 2021-05-29 ENCOUNTER — Other Ambulatory Visit: Payer: Self-pay | Admitting: Internal Medicine

## 2021-05-29 ENCOUNTER — Ambulatory Visit (INDEPENDENT_AMBULATORY_CARE_PROVIDER_SITE_OTHER): Payer: Self-pay | Admitting: Internal Medicine

## 2021-05-29 ENCOUNTER — Other Ambulatory Visit: Payer: Self-pay

## 2021-05-29 VITALS — BP 152/74 | HR 67 | Ht 69.0 in | Wt 276.0 lb

## 2021-05-29 DIAGNOSIS — I480 Paroxysmal atrial fibrillation: Secondary | ICD-10-CM

## 2021-05-29 NOTE — Patient Instructions (Signed)
Medication Instructions:  Your physician recommends that you continue on your current medications as directed. Please refer to the Current Medication list given to you today.  Labwork: None ordered.  Testing/Procedures: None ordered.  Follow-Up: Your physician wants you to follow-up in: one year with Gregg Taylor, MD   Any Other Special Instructions Will Be Listed Below (If Applicable).  If you need a refill on your cardiac medications before your next appointment, please call your pharmacy.      

## 2021-05-29 NOTE — Progress Notes (Signed)
HPI [name] returns today for followup. She is a pleasant 55 yo woman with a h/o atrial fib with a RVR, obesity and HTN. She has been on flecainide and metoprolol. She has had some positional dizziness. She has palpitations occurring about a dozen times in the past year, lasting no more than 30 minutes. She has had one episode which did not stop and prompted her to seek medical attention. She eventually spontaneously went back to NSR. She also notes episodes of intermittent CP which is non-exertional and associated with no specific activity. She admits to some dietary indiscretion and has not lost weight in the past several months.        Allergies   Allergies  Allergen Reactions   Penicillins Anaphylaxis, Shortness Of Breath and Swelling    Has patient had a PCN reaction causing immediate rash, facial/tongue/throat swelling, SOB or lightheadedness with hypotension: Yes Has patient had a PCN reaction causing severe rash involving mucus membranes or skin necrosis: No Has patient had a PCN reaction that required hospitalization: While hospitalized Has patient had a PCN reaction occurring within the last 10 years: No If all of the above answers are "NO", then may proceed with Cephalosporin use.    Bee Venom Swelling and Other (See Comments)    Swelling and hardness at the site of bee sting.     Current Outpatient Medications  Medication Sig Dispense Refill   flecainide (TAMBOCOR) 100 MG tablet Take 1 tablet (100 mg total) by mouth 2 (two) times daily. 60 tablet 11   ibuprofen (ADVIL,MOTRIN) 200 MG tablet Take 1,000-1,200 mg by mouth daily as needed for mild pain.     metoprolol tartrate (LOPRESSOR) 50 MG tablet Take 1 tablet (50 mg total) by mouth 2 (two) times daily. 180 tablet 3   Multiple Vitamin (MULTIVITAMIN WITH MINERALS) TABS tablet Take 1 tablet by mouth daily.     nitroGLYCERIN (NITROSTAT) 0.4 MG SL tablet Take one tablet by mouth as needed for exertional chest pain  discomfort.  May take 1 tablet by mouth daily. 60 tablet 1   No current facility-administered medications for this visit.     Past Medical History:  Diagnosis Date   Atrial fibrillation with RVR (HCC) 12/26/2016   Sepsis (HCC)    WPW (Wolff-Parkinson-White syndrome) 12/26/2016    ROS:   All systems reviewed and negative except as noted in the HPI.   Past Surgical History:  Procedure Laterality Date   ABDOMINAL HYSTERECTOMY     KNEE SURGERY     V TACH ABLATION N/A 02/24/2017   Procedure: V Tach Ablation WPW Ablation;  Surgeon: Marinus Maw, MD;  Location: [facility] INVASIVE CV LAB;  Service: Cardiovascular;  Laterality: N/A;     Family History  Problem Relation Age of Onset   Diabetes Mellitus II Mother    Heart failure Mother    Heart attack Father    Stroke Father      Social History   Socioeconomic History   Marital status: Single    Spouse name: Not on file   Number of children: Not on file   Years of education: Not on file   Highest education level: Not on file  Occupational History   Not on file  Tobacco Use   Smoking status: Never   Smokeless tobacco: Never  Vaping Use   Vaping Use: Never used  Substance and Sexual Activity   Alcohol use: Yes    Comment: occ   Drug  use: No   Sexual activity: Not on file  Other Topics Concern   Not on file  Social History Narrative   Not on file   Social Determinants of Health   Financial Resource Strain: Not on file  Food Insecurity: Not on file  Transportation Needs: Not on file  Physical Activity: Not on file  Stress: Not on file  Social Connections: Not on file  Intimate Partner Violence: Not on file     BP (!) 152/74   Pulse 67   Ht 5\' 9"  (1.753 m)   Wt 276 lb (125.2 kg)   SpO2 98%   BMI 40.76 kg/m   Physical Exam:  Well appearing NAD HEENT: Unremarkable Neck:  No JVD, no thyromegally Lymphatics:  No adenopathy Back:  No CVA tenderness Lungs:  Clear with no wheezes HEART:  Regular rate  rhythm, no murmurs, no rubs, no clicks Abd:  soft, positive bowel sounds, no organomegally, no rebound, no guarding Ext:  2 plus pulses, no edema, no cyanosis, no clubbing Skin:  No rashes no nodules Neuro:  CN II through XII intact, motor grossly intact  EKG - NSR  DEVICE  Normal device function.  See PaceArt for details.   Assess/Plan:  1. PAF - she is mostly controlled. I discussed the treatment options with the patient. She will continue her flecainide and beta blocker 2. Obesity - I encouraged her to avoid salty foods and to lose weight 3. HTN - her bp is elevated. She will need to lose weight and I encouraged her to discuss with her primary MD.  Lamesha Tibbits,MD

## 2021-06-01 ENCOUNTER — Other Ambulatory Visit: Payer: Self-pay | Admitting: Internal Medicine

## 2021-06-01 NOTE — Telephone Encounter (Signed)
Pt's medication was sent to pt's pharmacy as requested. Confirmation received.  °

## 2021-08-03 ENCOUNTER — Emergency Department (HOSPITAL_BASED_OUTPATIENT_CLINIC_OR_DEPARTMENT_OTHER)
Admission: EM | Admit: 2021-08-03 | Discharge: 2021-08-03 | Disposition: A | Discharge status: Home / Self Care | Payer: Self-pay | Attending: Emergency Medicine | Admitting: Emergency Medicine

## 2021-08-03 ENCOUNTER — Emergency Department (HOSPITAL_BASED_OUTPATIENT_CLINIC_OR_DEPARTMENT_OTHER): Payer: Self-pay

## 2021-08-03 ENCOUNTER — Other Ambulatory Visit: Payer: Self-pay

## 2021-08-03 ENCOUNTER — Encounter (HOSPITAL_BASED_OUTPATIENT_CLINIC_OR_DEPARTMENT_OTHER): Payer: Self-pay | Admitting: *Deleted

## 2021-08-03 DIAGNOSIS — R002 Palpitations: Secondary | ICD-10-CM | POA: Insufficient documentation

## 2021-08-03 DIAGNOSIS — Z79899 Other long term (current) drug therapy: Secondary | ICD-10-CM | POA: Insufficient documentation

## 2021-08-03 DIAGNOSIS — R600 Localized edema: Secondary | ICD-10-CM | POA: Insufficient documentation

## 2021-08-03 DIAGNOSIS — R079 Chest pain, unspecified: Secondary | ICD-10-CM | POA: Insufficient documentation

## 2021-08-03 DIAGNOSIS — R000 Tachycardia, unspecified: Secondary | ICD-10-CM | POA: Insufficient documentation

## 2021-08-03 LAB — CBC
HCT: 41.5 % (ref 36.0–46.0)
Hemoglobin: 14.1 g/dL (ref 12.0–15.0)
MCH: 29.4 pg (ref 26.0–34.0)
MCHC: 34 g/dL (ref 30.0–36.0)
MCV: 86.6 fL (ref 80.0–100.0)
Platelets: 317 10*3/uL (ref 150–400)
RBC: 4.79 MIL/uL (ref 3.87–5.11)
RDW: 13.3 % (ref 11.5–15.5)
WBC: 9.7 10*3/uL (ref 4.0–10.5)
nRBC: 0 % (ref 0.0–0.2)

## 2021-08-03 LAB — PREGNANCY, URINE: Preg Test, Ur: NEGATIVE

## 2021-08-03 LAB — BASIC METABOLIC PANEL
Anion gap: 9 (ref 5–15)
BUN: 19 mg/dL (ref 6–20)
CO2: 25 mmol/L (ref 22–32)
Calcium: 9.3 mg/dL (ref 8.9–10.3)
Chloride: 104 mmol/L (ref 98–111)
Creatinine, Ser: 0.7 mg/dL (ref 0.44–1.00)
GFR, Estimated: >60 mL/min (ref >60)
Glucose, Bld: 137 mg/dL — ABNORMAL HIGH (ref 70–99)
Potassium: 3.9 mmol/L (ref 3.5–5.1)
Sodium: 138 mmol/L (ref 135–145)

## 2021-08-03 LAB — HEPATIC FUNCTION PANEL
ALT: 26 U/L (ref 0–44)
AST: 27 U/L (ref 15–41)
Albumin: 3.9 g/dL (ref 3.5–5.0)
Alkaline Phosphatase: 53 U/L (ref 38–126)
Bilirubin, Direct: 0.1 mg/dL (ref 0.0–0.2)
Indirect Bilirubin: 0.5 mg/dL (ref 0.3–0.9)
Total Bilirubin: 0.6 mg/dL (ref 0.3–1.2)
Total Protein: 8.1 g/dL (ref 6.5–8.1)

## 2021-08-03 LAB — MAGNESIUM: Magnesium: 2.1 mg/dL (ref 1.7–2.4)

## 2021-08-03 LAB — TROPONIN I (HIGH SENSITIVITY)
Troponin I (High Sensitivity): 5 ng/L (ref <18)
Troponin I (High Sensitivity): 6 ng/L (ref <18)

## 2021-08-03 LAB — BRAIN NATRIURETIC PEPTIDE: B Natriuretic Peptide: 594.7 pg/mL — ABNORMAL HIGH (ref 0.0–100.0)

## 2021-08-03 MED ORDER — DILTIAZEM HCL 25 MG/5ML IV SOLN
20.0000 mg | Freq: Once | INTRAVENOUS | Status: AC
  Administered 2021-08-03: 20 mg via INTRAVENOUS
  Filled 2021-08-03: qty 5

## 2021-08-03 MED ORDER — SODIUM CHLORIDE 0.9 % IV BOLUS
500.0000 mL | Freq: Once | INTRAVENOUS | Status: AC
  Administered 2021-08-03: 500 mL via INTRAVENOUS

## 2021-08-03 MED ORDER — DILTIAZEM HCL 30 MG PO TABS
60.0000 mg | ORAL_TABLET | Freq: Once | ORAL | Status: AC
  Administered 2021-08-03: 60 mg via ORAL
  Filled 2021-08-03: qty 2

## 2021-08-03 NOTE — ED Notes (Signed)
Pt A&OX4 ambulatory at d/c with independent steady gait. Pt verbalized understanding of d/c instructions and follow up care. 

## 2021-08-03 NOTE — Discharge Instructions (Signed)
You have been seen and discharged from the emergency department.  You were found to have atrial fibrillation with a fast rate.  You were given IV and oral medication here in the emergency department you converted back to normal sinus rhythm.  The rest of your work-up including cardiac work-up was normal and reassuring.  Follow-up with your cardiologist for further evaluation and further care. Take home medications as prescribed. If you have any worsening symptoms or further concerns for your health please return to an emergency department for further evaluation.

## 2021-08-03 NOTE — ED Triage Notes (Addendum)
Chest pain described as palpitations x 12 hours ago. She took Metoprolol  x3 Q 15 minutes and Flecainide the last dose with some relief but it was never relieved. She took this round of medications x 2 at home over the past 12 hours.  Hx of atrial fib. She gets light headed if she tried to walk.

## 2021-08-03 NOTE — ED Provider Notes (Signed)
[facility] EMERGENCY DEPARTMENT Provider Note   CSN: [id number] Arrival date & time: 08/03/21  1631     History  Chief Complaint  Patient presents with   Chest Pain    [name] is a 56 y.o. female.  HPI  56 year old female with past medical history of atrial fibrillation controlled on flecainide and metoprolol who follows with Dr. Ladona Ridgel for cardiology presents the emergency department with palpitations and chest heaviness.  Patient states that she noticed this happening about 12 hours prior to arrival.  She took additional doses of her home metoprolol as instructed by cardiology without any significant relief.  Patient states now with ambulation she feels very short of breath and at times lightheaded.  She still has some mild chest heaviness but denies any back pain, headache, acutely worsening swelling of the lower extremities.  No recent fever, cough, hemoptysis.  She has otherwise been compliant with her medications.  Home Medications Prior to Admission medications   Medication Sig Start Date End Date Taking? Authorizing Provider  flecainide (TAMBOCOR) 100 MG tablet TAKE 1 TABLET BY MOUTH TWICE A DAY 05/29/21   Marinus Maw, MD  ibuprofen (ADVIL,MOTRIN) 200 MG tablet Take 1,000-1,200 mg by mouth daily as needed for mild pain.    [provider]  metoprolol tartrate (LOPRESSOR) 50 MG tablet TAKE 1 TABLET BY MOUTH TWICE A DAY 06/01/21   Marinus Maw, MD  Multiple Vitamin (MULTIVITAMIN WITH MINERALS) TABS tablet Take 1 tablet by mouth daily.    [provider]  nitroGLYCERIN (NITROSTAT) 0.4 MG SL tablet Take one tablet by mouth as needed for exertional chest pain discomfort.  May take 1 tablet by mouth daily. 05/23/20   Marinus Maw, MD      Allergies    Penicillins and Bee venom    Review of Systems   Review of Systems  Constitutional:  Positive for fatigue. Negative for fever.  Respiratory:  Positive for chest tightness. Negative  for shortness of breath.   Cardiovascular:  Positive for palpitations. Negative for chest pain and leg swelling.  Gastrointestinal:  Negative for abdominal pain, diarrhea and vomiting.  Musculoskeletal:  Negative for back pain.  Skin:  Negative for rash.  Neurological:  Positive for light-headedness. Negative for dizziness and headaches.   Physical Exam Updated Vital Signs BP (!) 118/55    Pulse 64    Temp 98.3 F (36.8 C) (Oral)    Resp 18    Ht 5\' 9"  (1.753 m)    Wt 125.2 kg    SpO2 95%    BMI 40.76 kg/m  Physical Exam Vitals and nursing note reviewed.  Constitutional:      General: She is not in acute distress.    Appearance: Normal appearance. She is not diaphoretic.  HENT:     Head: Normocephalic.     Mouth/Throat:     Mouth: Mucous membranes are moist.  Cardiovascular:     Rate and Rhythm: Tachycardia present. Rhythm irregular.  Pulmonary:     Effort: Pulmonary effort is normal. No respiratory distress.     Breath sounds: No decreased breath sounds.  Abdominal:     Palpations: Abdomen is soft.     Tenderness: There is no abdominal tenderness.  Musculoskeletal:     Right lower leg: Edema present.     Left lower leg: Edema present.     Comments: Trace ble edema which patient states is baseline and unchanged  Skin:    General: Skin is  warm.  Neurological:     Mental Status: She is alert and oriented to person, place, and time. Mental status is at baseline.  Psychiatric:        Mood and Affect: Mood normal.    ED Results / Procedures / Treatments   Labs (all labs ordered are listed, but only abnormal results are displayed) Labs Reviewed  BASIC METABOLIC PANEL  CBC  PREGNANCY, URINE  BRAIN NATRIURETIC PEPTIDE  MAGNESIUM  HEPATIC FUNCTION PANEL  TROPONIN I (HIGH SENSITIVITY)    EKG None  Radiology No results found.  Procedures .Critical Care Performed by: Rozelle Logan, DO Authorized by: Rozelle Logan, DO   Critical care provider statement:     Critical care time (minutes):  45   Critical care time was exclusive of:  Separately billable procedures and treating other patients   Critical care was necessary to treat or prevent imminent or life-threatening deterioration of the following conditions:  Circulatory failure   Critical care was time spent personally by me on the following activities:  Development of treatment plan with patient or surrogate, discussions with consultants, evaluation of patient's response to treatment, examination of patient, ordering and review of laboratory studies, ordering and review of radiographic studies, ordering and performing treatments and interventions, pulse oximetry, re-evaluation of patient's condition and review of old charts   I assumed direction of critical care for this patient from another provider in my specialty: no      Medications Ordered in ED Medications  sodium chloride 0.9 % bolus 500 mL (500 mLs Intravenous New Bag/Given 08/03/21 1727)  diltiazem (CARDIZEM) injection 20 mg (20 mg Intravenous Given 08/03/21 1728)    ED Course/ Medical Decision Making/ A&P                           Medical Decision Making Amount and/or Complexity of Data Reviewed Labs: ordered. Radiology: ordered.  Risk Prescription drug management.   This patient presents to the ED for concern of palpitations, chest heaviness, this involves an extensive number of treatment options, and is a complaint that carries with it a high risk of complications and morbidity.  The differential diagnosis includes arrhythmia, ACS, CHF   Additional history obtained: -External records from outside source obtained and reviewed including: Chart review including previous notes, labs, imaging, consultation notes   Lab Tests: -I ordered, reviewed, and interpreted labs.  The pertinent results include: Normal and baseline blood work, negative troponins, normal electrolytes, mildly elevated BNP which is nonspecific   EKG -Initial  EKGs were A-fib with RVR, converted to normal sinus rhythm   Imaging Studies ordered: -I ordered imaging studies including chest x-ray -I independently visualized and interpreted imaging which showed no findings of heart failure -I agree with the radiologist interpretation   Medicines ordered and prescription drug management: -I ordered medication including IV and p.o. antiarrhythmic for arrhythmia -Reevaluation of the patient after these medicines showed that the patient resolved -I have reviewed the patients home medicines and have made adjustments as needed   ED Course: 56 year old female presents emergency department with palpitations.  She took her home additional doses as instructed by cardiology without any relief.  On arrival she is atrial fibrillation with RVR, stable blood pressure.  Does not look acutely ill, complaining of some mild chest heaviness.  IV Cardizem given with great response, p.o. medication given.  Blood work is very reassuring, troponins are negative.  Electrolytes are normal.  BNP is  slightly elevated but no objective findings of CHF.  Patient spontaneously converted to normal sinus rhythm.  She remained in this rhythm for over an hour of observation.  She was ambulated twice without any change in this rhythm.  No shortness of breath, no other acute findings.  The decision for anticoagulation has been a discussion with her and her cardiologist, she will readdress this with them but at this time has been holding off on anticoagulation.   Critical Interventions: IV antiarrhythmic   Cardiac Monitoring: The patient was maintained on a cardiac monitor.  I personally viewed and interpreted the cardiac monitored which showed an underlying rhythm of: Atrial fibrillation converted to normal sinus rhythm   Reevaluation: After the interventions noted above, I reevaluated the patient and found that they have :resolved   Dispostion: Patient at this time appears safe and  stable for discharge and close outpatient follow up. Discharge plan and strict return to ED precautions discussed, patient verbalizes understanding and agreement.        Final Clinical Impression(s) / ED Diagnoses Final diagnoses:  None    Rx / DC Orders ED Discharge Orders     None         Rozelle LoganHorton, Deshae Dickison M, DO 08/03/21 2140

## 2021-08-30 ENCOUNTER — Other Ambulatory Visit: Payer: Self-pay

## 2021-08-30 ENCOUNTER — Encounter (HOSPITAL_COMMUNITY)
Admission: EM | Disposition: A | Discharge status: Home / Self Care | Payer: Self-pay | Source: Home / Self Care | Attending: Cardiology

## 2021-08-30 ENCOUNTER — Emergency Department (HOSPITAL_COMMUNITY): Payer: Self-pay

## 2021-08-30 ENCOUNTER — Inpatient Hospital Stay (HOSPITAL_COMMUNITY)
Admission: EM | Admit: 2021-08-30 | Discharge: 2021-08-31 | DRG: 281 | Disposition: A | Discharge status: Home / Self Care | Payer: Self-pay | Attending: Cardiology | Admitting: Cardiology

## 2021-08-30 ENCOUNTER — Inpatient Hospital Stay (HOSPITAL_COMMUNITY): Payer: Self-pay

## 2021-08-30 DIAGNOSIS — I471 Supraventricular tachycardia: Secondary | ICD-10-CM | POA: Diagnosis present

## 2021-08-30 DIAGNOSIS — R9431 Abnormal electrocardiogram [ECG] [EKG]: Secondary | ICD-10-CM

## 2021-08-30 DIAGNOSIS — Z880 Allergy status to penicillin: Secondary | ICD-10-CM

## 2021-08-30 DIAGNOSIS — I480 Paroxysmal atrial fibrillation: Secondary | ICD-10-CM | POA: Diagnosis present

## 2021-08-30 DIAGNOSIS — I456 Pre-excitation syndrome: Secondary | ICD-10-CM | POA: Diagnosis present

## 2021-08-30 DIAGNOSIS — Z8249 Family history of ischemic heart disease and other diseases of the circulatory system: Secondary | ICD-10-CM

## 2021-08-30 DIAGNOSIS — Z20822 Contact with and (suspected) exposure to covid-19: Secondary | ICD-10-CM | POA: Diagnosis present

## 2021-08-30 DIAGNOSIS — I10 Essential (primary) hypertension: Secondary | ICD-10-CM | POA: Diagnosis present

## 2021-08-30 DIAGNOSIS — I959 Hypotension, unspecified: Secondary | ICD-10-CM | POA: Diagnosis present

## 2021-08-30 DIAGNOSIS — I4891 Unspecified atrial fibrillation: Secondary | ICD-10-CM | POA: Diagnosis present

## 2021-08-30 DIAGNOSIS — Z6839 Body mass index (BMI) 39.0-39.9, adult: Secondary | ICD-10-CM

## 2021-08-30 DIAGNOSIS — I4819 Other persistent atrial fibrillation: Secondary | ICD-10-CM

## 2021-08-30 DIAGNOSIS — I249 Acute ischemic heart disease, unspecified: Secondary | ICD-10-CM

## 2021-08-30 DIAGNOSIS — Z833 Family history of diabetes mellitus: Secondary | ICD-10-CM

## 2021-08-30 DIAGNOSIS — I214 Non-ST elevation (NSTEMI) myocardial infarction: Principal | ICD-10-CM | POA: Diagnosis present

## 2021-08-30 DIAGNOSIS — Z91030 Bee allergy status: Secondary | ICD-10-CM

## 2021-08-30 DIAGNOSIS — Z823 Family history of stroke: Secondary | ICD-10-CM

## 2021-08-30 DIAGNOSIS — E669 Obesity, unspecified: Secondary | ICD-10-CM | POA: Diagnosis present

## 2021-08-30 DIAGNOSIS — I4892 Unspecified atrial flutter: Secondary | ICD-10-CM | POA: Diagnosis present

## 2021-08-30 HISTORY — PX: LEFT HEART CATH AND CORONARY ANGIOGRAPHY: CATH118249

## 2021-08-30 LAB — TROPONIN I (HIGH SENSITIVITY)
Troponin I (High Sensitivity): 14 ng/L (ref <18)
Troponin I (High Sensitivity): 130 ng/L (ref <18)

## 2021-08-30 LAB — COMPREHENSIVE METABOLIC PANEL
ALT: 28 U/L (ref 0–44)
AST: 33 U/L (ref 15–41)
Albumin: 3.5 g/dL (ref 3.5–5.0)
Alkaline Phosphatase: 56 U/L (ref 38–126)
Anion gap: 10 (ref 5–15)
BUN: 14 mg/dL (ref 6–20)
CO2: 24 mmol/L (ref 22–32)
Calcium: 9 mg/dL (ref 8.9–10.3)
Chloride: 106 mmol/L (ref 98–111)
Creatinine, Ser: 0.9 mg/dL (ref 0.44–1.00)
GFR, Estimated: >60 mL/min (ref >60)
Glucose, Bld: 201 mg/dL — ABNORMAL HIGH (ref 70–99)
Potassium: 3.7 mmol/L (ref 3.5–5.1)
Sodium: 140 mmol/L (ref 135–145)
Total Bilirubin: 0.4 mg/dL (ref 0.3–1.2)
Total Protein: 7 g/dL (ref 6.5–8.1)

## 2021-08-30 LAB — RESP PANEL BY RT-PCR (FLU A&B, COVID) ARPGX2
Influenza A by PCR: NEGATIVE
Influenza B by PCR: NEGATIVE
SARS Coronavirus 2 by RT PCR: NEGATIVE

## 2021-08-30 LAB — CBC WITH DIFFERENTIAL/PLATELET
Abs Immature Granulocytes: 0.05 10*3/uL (ref 0.00–0.07)
Basophils Absolute: 0.1 10*3/uL (ref 0.0–0.1)
Basophils Relative: 1 %
Eosinophils Absolute: 0.3 10*3/uL (ref 0.0–0.5)
Eosinophils Relative: 3 %
HCT: 45 % (ref 36.0–46.0)
Hemoglobin: 14.7 g/dL (ref 12.0–15.0)
Immature Granulocytes: 1 %
Lymphocytes Relative: 23 %
Lymphs Abs: 2.4 10*3/uL (ref 0.7–4.0)
MCH: 29.2 pg (ref 26.0–34.0)
MCHC: 32.7 g/dL (ref 30.0–36.0)
MCV: 89.3 fL (ref 80.0–100.0)
Monocytes Absolute: 0.7 10*3/uL (ref 0.1–1.0)
Monocytes Relative: 7 %
Neutro Abs: 7 10*3/uL (ref 1.7–7.7)
Neutrophils Relative %: 65 %
Platelets: 316 10*3/uL (ref 150–400)
RBC: 5.04 MIL/uL (ref 3.87–5.11)
RDW: 13.4 % (ref 11.5–15.5)
WBC: 10.4 10*3/uL (ref 4.0–10.5)
nRBC: 0 % (ref 0.0–0.2)

## 2021-08-30 LAB — ECHOCARDIOGRAM COMPLETE
AR max vel: 3.46 cm2
AV Area VTI: 3.16 cm2
AV Area mean vel: 3.23 cm2
AV Mean grad: 5 mmHg
AV Peak grad: 8.5 mmHg
Ao pk vel: 1.46 m/s
Calc EF: 50.8 %
Height: 69 in
S' Lateral: 2.9 cm
Single Plane A2C EF: 46 %
Single Plane A4C EF: 51.7 %
Weight: 4240 oz

## 2021-08-30 SURGERY — LEFT HEART CATH AND CORONARY ANGIOGRAPHY
Anesthesia: LOCAL

## 2021-08-30 MED ORDER — VERAPAMIL HCL 2.5 MG/ML IV SOLN
INTRAVENOUS | Status: DC | PRN
  Administered 2021-08-30: 10 mL via INTRA_ARTERIAL

## 2021-08-30 MED ORDER — HEPARIN SODIUM (PORCINE) 1000 UNIT/ML IJ SOLN
INTRAMUSCULAR | Status: DC | PRN
  Administered 2021-08-30: 5000 [IU] via INTRAVENOUS

## 2021-08-30 MED ORDER — HEPARIN BOLUS VIA INFUSION
4000.0000 [IU] | Freq: Once | INTRAVENOUS | Status: AC
  Administered 2021-08-30: 4000 [IU] via INTRAVENOUS
  Filled 2021-08-30: qty 4000

## 2021-08-30 MED ORDER — AMIODARONE HCL IN DEXTROSE 360-4.14 MG/200ML-% IV SOLN
60.0000 mg/h | INTRAVENOUS | Status: AC
  Administered 2021-08-30: 60 mg/h via INTRAVENOUS
  Filled 2021-08-30 (×2): qty 200

## 2021-08-30 MED ORDER — NITROGLYCERIN 0.4 MG SL SUBL: 0.4000 mg | SUBLINGUAL_TABLET | SUBLINGUAL | Status: DC | PRN

## 2021-08-30 MED ORDER — ACETAMINOPHEN 325 MG PO TABS: 650.0000 mg | ORAL_TABLET | ORAL | Status: DC | PRN

## 2021-08-30 MED ORDER — HYDRALAZINE HCL 20 MG/ML IJ SOLN: 10.0000 mg | INTRAMUSCULAR | Status: AC | PRN

## 2021-08-30 MED ORDER — AMIODARONE LOAD VIA INFUSION
150.0000 mg | Freq: Once | INTRAVENOUS | Status: AC
  Administered 2021-08-30: 150 mg via INTRAVENOUS

## 2021-08-30 MED ORDER — MIDAZOLAM HCL 2 MG/2ML IJ SOLN
INTRAMUSCULAR | Status: AC
  Filled 2021-08-30: qty 2

## 2021-08-30 MED ORDER — HEPARIN (PORCINE) 25000 UT/250ML-% IV SOLN
1400.0000 [IU]/h | INTRAVENOUS | Status: DC
  Administered 2021-08-30: 1400 [IU]/h via INTRAVENOUS

## 2021-08-30 MED ORDER — METOPROLOL TARTRATE 5 MG/5ML IV SOLN
5.0000 mg | Freq: Once | INTRAVENOUS | Status: AC
  Administered 2021-08-30: 5 mg via INTRAVENOUS
  Filled 2021-08-30: qty 5

## 2021-08-30 MED ORDER — SODIUM CHLORIDE 0.9 % IV SOLN: 250.0000 mL | INTRAVENOUS | Status: DC | PRN

## 2021-08-30 MED ORDER — LIDOCAINE HCL (PF) 1 % IJ SOLN
INTRAMUSCULAR | Status: DC | PRN
  Administered 2021-08-30: 2 mL via INTRADERMAL

## 2021-08-30 MED ORDER — ASPIRIN 81 MG PO CHEW
81.0000 mg | CHEWABLE_TABLET | ORAL | Status: AC
  Administered 2021-08-30: 81 mg via ORAL
  Filled 2021-08-30: qty 1

## 2021-08-30 MED ORDER — SODIUM CHLORIDE 0.9 % IV BOLUS
500.0000 mL | Freq: Once | INTRAVENOUS | Status: AC
  Administered 2021-08-30: 500 mL via INTRAVENOUS

## 2021-08-30 MED ORDER — HEPARIN (PORCINE) IN NACL 1000-0.9 UT/500ML-% IV SOLN
INTRAVENOUS | Status: DC | PRN
  Administered 2021-08-30 (×2): 500 mL

## 2021-08-30 MED ORDER — MIDAZOLAM HCL 2 MG/2ML IJ SOLN
INTRAMUSCULAR | Status: DC | PRN
  Administered 2021-08-30: 1 mg via INTRAVENOUS

## 2021-08-30 MED ORDER — METOPROLOL TARTRATE 25 MG PO TABS
25.0000 mg | ORAL_TABLET | Freq: Two times a day (BID) | ORAL | Status: DC
Start: 2021-08-30 — End: 2021-08-31
  Administered 2021-08-30 – 2021-08-31 (×2): 25 mg via ORAL
  Filled 2021-08-30 (×3): qty 1

## 2021-08-30 MED ORDER — SODIUM CHLORIDE 0.9 % IV SOLN: INTRAVENOUS | Status: DC

## 2021-08-30 MED ORDER — SODIUM CHLORIDE 0.9% FLUSH
3.0000 mL | Freq: Two times a day (BID) | INTRAVENOUS | Status: DC
  Administered 2021-08-30 – 2021-08-31 (×2): 3 mL via INTRAVENOUS

## 2021-08-30 MED ORDER — ONDANSETRON HCL 4 MG/2ML IJ SOLN: 4.0000 mg | Freq: Four times a day (QID) | INTRAMUSCULAR | Status: DC | PRN

## 2021-08-30 MED ORDER — SODIUM CHLORIDE 0.9% FLUSH: 3.0000 mL | INTRAVENOUS | Status: DC | PRN

## 2021-08-30 MED ORDER — LABETALOL HCL 5 MG/ML IV SOLN: 10.0000 mg | INTRAVENOUS | Status: AC | PRN

## 2021-08-30 MED ORDER — ASPIRIN 300 MG RE SUPP: 300.0000 mg | RECTAL | Status: DC

## 2021-08-30 MED ORDER — IOHEXOL 350 MG/ML SOLN
INTRAVENOUS | Status: DC | PRN
  Administered 2021-08-30: 50 mL via INTRA_ARTERIAL

## 2021-08-30 MED ORDER — FENTANYL CITRATE (PF) 100 MCG/2ML IJ SOLN
INTRAMUSCULAR | Status: DC | PRN
Start: 2021-08-30 — End: 2021-08-30
  Administered 2021-08-30: 25 ug via INTRAVENOUS

## 2021-08-30 MED ORDER — DIGOXIN 0.25 MG/ML IJ SOLN
0.1250 mg | Freq: Once | INTRAMUSCULAR | Status: AC
  Administered 2021-08-30: 0.125 mg via INTRAVENOUS
  Filled 2021-08-30: qty 2

## 2021-08-30 MED ORDER — VERAPAMIL HCL 2.5 MG/ML IV SOLN
INTRAVENOUS | Status: AC
  Filled 2021-08-30: qty 2

## 2021-08-30 MED ORDER — FENTANYL CITRATE (PF) 100 MCG/2ML IJ SOLN
INTRAMUSCULAR | Status: AC
  Filled 2021-08-30: qty 2

## 2021-08-30 MED ORDER — HEPARIN SODIUM (PORCINE) 1000 UNIT/ML IJ SOLN
INTRAMUSCULAR | Status: AC
  Filled 2021-08-30: qty 10

## 2021-08-30 MED ORDER — LIDOCAINE HCL (PF) 1 % IJ SOLN
INTRAMUSCULAR | Status: AC
Start: 2021-08-30 — End: ?
  Filled 2021-08-30: qty 30

## 2021-08-30 MED ORDER — AMIODARONE HCL IN DEXTROSE 360-4.14 MG/200ML-% IV SOLN
30.0000 mg/h | INTRAVENOUS | Status: DC
  Administered 2021-08-30 (×2): 30 mg/h via INTRAVENOUS
  Filled 2021-08-30: qty 200

## 2021-08-30 MED ORDER — HEPARIN (PORCINE) 25000 UT/250ML-% IV SOLN
1400.0000 [IU]/h | INTRAVENOUS | Status: DC
  Administered 2021-08-30 (×2): 1400 [IU]/h via INTRAVENOUS
  Filled 2021-08-30 (×2): qty 250

## 2021-08-30 MED ORDER — ASPIRIN 81 MG PO CHEW: 324.0000 mg | CHEWABLE_TABLET | ORAL | Status: DC

## 2021-08-30 MED ORDER — HEPARIN (PORCINE) IN NACL 1000-0.9 UT/500ML-% IV SOLN
INTRAVENOUS | Status: AC
  Filled 2021-08-30: qty 1000

## 2021-08-30 SURGICAL SUPPLY — 10 items
CATH INFINITI 6F ANG MULTIPACK (CATHETERS) ×1 IMPLANT
DEVICE RAD COMP TR BAND LRG (VASCULAR PRODUCTS) ×1 IMPLANT
GLIDESHEATH SLEND SS 6F .021 (SHEATH) ×1 IMPLANT
GUIDEWIRE INQWIRE 1.5J.035X260 (WIRE) IMPLANT
INQWIRE 1.5J .035X260CM (WIRE) ×2
KIT HEART LEFT (KITS) ×2 IMPLANT
PACK CARDIAC CATHETERIZATION (CUSTOM PROCEDURE TRAY) ×2 IMPLANT
SYR MEDRAD MARK 7 150ML (SYRINGE) ×2 IMPLANT
TRANSDUCER W/STOPCOCK (MISCELLANEOUS) ×2 IMPLANT
TUBING CIL FLEX 10 FLL-RA (TUBING) ×2 IMPLANT

## 2021-08-30 NOTE — ED Notes (Signed)
MD aware of HR

## 2021-08-30 NOTE — ED Triage Notes (Signed)
Pt BIB EMS for SVT. Pts HR was 190 and is 120 on aRRIVAL. pT IS HAVING 9/10 CHEST PAIN, HYPOTENSIVE, and is feeling light headed. Pt is axox4. Pt has hx of afib.  ?

## 2021-08-30 NOTE — ED Notes (Signed)
Purewick placed on pt. 

## 2021-08-30 NOTE — ED Provider Notes (Signed)
[facility] EMERGENCY DEPARTMENT Provider Note   CSN: [id number] Arrival date & time: 08/30/21  5885     History  No chief complaint on file.   [name] is a 56 y.o. female.  HPI She presents for evaluation of palpitations, and feeling weak.  She noticed this shortly after she got up and took her usual morning medications.  She thought that she was in rapid atrial fibrillation so she took 3 doses of metoprolol, followed by dose of flecainide.  She still felt bad so she took a nitroglycerin tablet.  She called EMS who transferred her here and treated only with a small bolus of IV fluids.  She has history of atrial fibrillation and WPW.  She has been taking her medications regularly.  She denies associated symptoms including fever, vomiting, cough or preceding shortness of breath.    Home Medications Prior to Admission medications   Medication Sig Start Date End Date Taking? Authorizing Provider  flecainide (TAMBOCOR) 100 MG tablet TAKE 1 TABLET BY MOUTH TWICE A DAY Patient taking differently: Take 100 mg by mouth 2 (two) times daily. 05/29/21  Yes Marinus Maw, MD  ibuprofen (ADVIL,MOTRIN) 200 MG tablet Take 600 mg by mouth daily as needed for mild pain.   Yes [provider]  metoprolol tartrate (LOPRESSOR) 50 MG tablet TAKE 1 TABLET BY MOUTH TWICE A DAY Patient taking differently: Take 50 mg by mouth 2 (two) times daily. 06/01/21  Yes Marinus Maw, MD  nitroGLYCERIN (NITROSTAT) 0.4 MG SL tablet Take one tablet by mouth as needed for exertional chest pain discomfort.  May take 1 tablet by mouth daily. Patient taking differently: Place 0.4 mg under the tongue every 5 (five) minutes as needed for chest pain. 05/23/20  Yes Marinus Maw, MD      Allergies    Penicillins and Bee venom    Review of Systems   Review of Systems  Physical Exam Updated Vital Signs BP (!) 107/49    Pulse 60    Temp 97.7 F (36.5 C) (Oral)    Resp 20    Ht 5\' 9"   (1.753 m)    Wt 120.2 kg    SpO2 97%    BMI 39.13 kg/m  Physical Exam Vitals and nursing note reviewed.  Constitutional:      General: She is in acute distress.     Appearance: She is well-developed. She is obese. She is diaphoretic. She is not ill-appearing.  HENT:     Head: Normocephalic and atraumatic.     Right Ear: External ear normal.     Left Ear: External ear normal.  Eyes:     Conjunctiva/sclera: Conjunctivae normal.     Pupils: Pupils are equal, round, and reactive to light.  Neck:     Trachea: Phonation normal.  Cardiovascular:     Rate and Rhythm: Regular rhythm. Tachycardia present.     Heart sounds: Normal heart sounds.  Pulmonary:     Effort: Pulmonary effort is normal. No respiratory distress.     Breath sounds: Normal breath sounds. No stridor.  Abdominal:     General: There is no distension.     Palpations: Abdomen is soft.     Tenderness: There is no abdominal tenderness.  Musculoskeletal:        General: Normal range of motion.     Cervical back: Normal range of motion and neck supple.  Skin:    General: Skin is warm.  Neurological:  Mental Status: She is alert and oriented to person, place, and time.     Cranial Nerves: No cranial nerve deficit.     Sensory: No sensory deficit.     Motor: No abnormal muscle tone.     Coordination: Coordination normal.  Psychiatric:        Mood and Affect: Mood normal.        Behavior: Behavior normal.        Thought Content: Thought content normal.        Judgment: Judgment normal.    ED Results / Procedures / Treatments   Labs (all labs ordered are listed, but only abnormal results are displayed) Labs Reviewed  COMPREHENSIVE METABOLIC PANEL - Abnormal; Notable for the following components:      Result Value   Glucose, Bld 201 (*)    All other components within normal limits  RESP PANEL BY RT-PCR (FLU A&B, COVID) ARPGX2  CBC WITH DIFFERENTIAL/PLATELET  TROPONIN I (HIGH SENSITIVITY)  TROPONIN I (HIGH  SENSITIVITY)    EKG EKG Interpretation  Date/Time:  Thursday August 30 2021 08:25:40 EST Ventricular Rate:  171 PR Interval:    QRS Duration: 99 QT Interval:  339 QTC Calculation: 572 R Axis:   -21 Text Interpretation: Supraventricular tachycardia Borderline left axis deviation Abnormal R-wave progression, late transition ST depression, probably rate related Since last tracing Rate faster and SVT is new . Also probable rate related ST depression is now present Confirmed by Mancel Bale [phone]) on 08/30/2021 8:47:00 AM  Radiology DG Chest Port 1 View  Result Date: 08/30/2021 CLINICAL DATA:  Palpitation. EXAM: PORTABLE CHEST 1 VIEW COMPARISON:  August 03, 2021. FINDINGS: Stable cardiomegaly with mild central pulmonary vascular congestion. Lungs are clear. Bony thorax is unremarkable. IMPRESSION: Stable cardiomegaly with mild central pulmonary vascular congestion. Electronically Signed   By: Lupita Raider M.D.   On: 08/30/2021 09:06    Procedures Procedures    Medications Ordered in ED Medications  amiodarone (NEXTERONE) 1.8 mg/mL load via infusion 150 mg (150 mg Intravenous Bolus from Bag 08/30/21 1027)    Followed by  amiodarone (NEXTERONE PREMIX) 360-4.14 MG/200ML-% (1.8 mg/mL) IV infusion (60 mg/hr Intravenous New Bag/Given 08/30/21 1027)    Followed by  amiodarone (NEXTERONE PREMIX) 360-4.14 MG/200ML-% (1.8 mg/mL) IV infusion (has no administration in time range)  nitroGLYCERIN (NITROSTAT) SL tablet 0.4 mg (has no administration in time range)  acetaminophen (TYLENOL) tablet 650 mg (has no administration in time range)  aspirin chewable tablet 324 mg (has no administration in time range)    Or  aspirin suppository 300 mg (has no administration in time range)  metoprolol tartrate (LOPRESSOR) tablet 25 mg (has no administration in time range)  sodium chloride 0.9 % bolus 500 mL (0 mLs Intravenous Stopped 08/30/21 1030)  metoprolol tartrate (LOPRESSOR) injection 5 mg (5 mg Intravenous  Given 08/30/21 0839)  digoxin (LANOXIN) 0.25 MG/ML injection 0.125 mg (0.125 mg Intravenous Given 08/30/21 0840)    ED Course/ Medical Decision Making/ A&P Clinical Course as of 08/30/21 1042  Thu Aug 30, 2021  0847 Heart rate still elevated, 166 bpm, appearing to be in SVT at this time.  He is concerned about NSTEMI since she has depression on her EKG.  Initial troponin normal.  We will check delta troponin.  Cardiology will admit the patient [EW]  [phone] Electrophysiologist with patient now and request that she be started on amiodarone with bolus and drip. [EW]    Clinical Course User Index [EW] Mancel Bale,  MD                           Medical Decision Making Patient presenting acutely ill with heart rate of 170 appearing to be SVT.  Shortly after arrival her heart rate lowered to 1 20-1 30 and was irregular appearing to be atrial fibrillation.  IV fluids begun as initial resuscitation and medication ordered to control heart rate by intravenous access; Lopressor and digoxin.  Patient has already had 2 doses of flecainide.  Amount and/or Complexity of Data Reviewed Independent Historian:     Details: She is a cogent historian External Data Reviewed: labs, radiology, ECG and notes.    Details: Records in EMR reviewed, follow-up for stable chronic paroxysmal atrial fibrillation.  She is not currently anticoagulated. Labs: ordered.    Details: CBC, metabolic panel, troponin-initial results, normal.  Delta troponin Radiology: ordered.    Details: Chest x-ray-no infiltrate or edema ECG/medicine tests: ordered and independent interpretation performed.    Details: Cardiac monitor-periods of SVT, and atrial fibrillation with rapid response. Discussion of management or test interpretation with external provider(s): Consultation cardiology, Dr. Lalla Brothers, they will admit the patient  Risk Prescription drug management. Decision regarding hospitalization. Risk Details: Patient presenting with  palpitations, shortness of breath and chest discomfort, with apparent new onset SVT, in periods of atrial fibrillation rapid ventricular response.  She required resuscitation with IV fluids, IV Lopressor and digoxin.  After consultation with electrophysiology, amiodarone was ordered.  Patient had improvement in her heart rate, prior to amiodarone administration, but remained irregular and tachycardic.  She will require hospitalization.  We prepared for cardioversion but she did not require cardioversion.  EKG indicating possible ischemia, when rate was rapid.  Initial troponin normal, approximately 3 hours after onset of symptoms.  Plan on cardiology admission.  Critical Care Total time providing critical care: 30-74 minutes          Final Clinical Impression(s) / ED Diagnoses Final diagnoses:  SVT (supraventricular tachycardia) (HCC)  Abnormal ECG    Rx / DC Orders ED Discharge Orders     None         Mancel Bale, MD 08/31/21 1029

## 2021-08-30 NOTE — ED Notes (Signed)
Cath lab transport at bedside to bring pt to cath lab 1. ?

## 2021-08-30 NOTE — ED Notes (Signed)
Cardiology at bedside.

## 2021-08-30 NOTE — Interval H&P Note (Signed)
History and Physical Interval Note: ? ?08/30/2021 ?4:54 PM ? ?[name]  has presented today for surgery, with the diagnosis of chest pain.  The various methods of treatment have been discussed with the patient and family. After consideration of risks, benefits and other options for treatment, the patient has consented to  Procedure(s): ?LEFT HEART CATH AND CORONARY ANGIOGRAPHY (N/A) as a surgical intervention.  The patient's history has been reviewed, patient examined, no change in status, stable for surgery.  I have reviewed the patient's chart and labs.  Questions were answered to the patient's satisfaction.   ? ?Cath Lab Visit (complete for each Cath Lab visit) ? ?Clinical Evaluation Leading to the Procedure:  ? ?ACS: Yes.   ? ?Non-ACS:   ? ?Anginal Classification: CCS III ? ?Anti-ischemic medical therapy: Minimal Therapy (1 class of medications) ? ?Non-Invasive Test Results: No non-invasive testing performed ? ?Prior CABG: No previous CABG ? ? ? ? ? ? ? ?Orbie Pyo ? ? ?

## 2021-08-30 NOTE — Progress Notes (Signed)
? ?  Echocardiogram ?2D Echocardiogram has been performed. ? ?[name] ?08/30/2021, 11:49 AM ?

## 2021-08-30 NOTE — Progress Notes (Signed)
ANTICOAGULATION CONSULT NOTE  ? ?Pharmacy Consult for heparin ?Indication: atrial fibrillation ? ?Allergies  ?Allergen Reactions  ? Penicillins Anaphylaxis, Shortness Of Breath and Swelling  ?  Has patient had a PCN reaction causing immediate rash, facial/tongue/throat swelling, SOB or lightheadedness with hypotension: Yes ?Has patient had a PCN reaction causing severe rash involving mucus membranes or skin necrosis: No ?Has patient had a PCN reaction that required hospitalization: While hospitalized ?Has patient had a PCN reaction occurring within the last 10 years: No ?If all of the above answers are "NO", then may proceed with Cephalosporin use. ?  ? Bee Venom Swelling and Other (See Comments)  ?  Swelling and hardness at the site of bee sting.  ? ? ?Patient Measurements: ?Height: 5\' 9"  (175.3 cm) ?Weight: 120.2 kg (265 lb) ?IBW/kg (Calculated) : 66.2 ?Heparin Dosing Weight: 94 kg  ? ?Vital Signs: ?BP: 134/50 (03/02 2145) ?Pulse Rate: 63 (03/02 2145) ? ?Labs: ?Recent Labs  ?  08/30/21 ?10/30/21 08/30/21 ?1028  ?HGB 14.7  --   ?HCT 45.0  --   ?PLT 316  --   ?CREATININE 0.90  --   ?TROPONINIHS 14 130*  ? ? ? ?Estimated Creatinine Clearance: 96.7 mL/min (by C-G formula based on SCr of 0.9 mg/dL). ? ? ?Medical History: ?Past Medical History:  ?Diagnosis Date  ? Atrial fibrillation with RVR (HCC) 12/26/2016  ? Sepsis (HCC)   ? WPW (Wolff-Parkinson-White syndrome) 12/26/2016  ? ? ?Medications:  ?Medications Prior to Admission  ?Medication Sig Dispense Refill Last Dose  ? ibuprofen (ADVIL,MOTRIN) 200 MG tablet Take 600 mg by mouth daily as needed for mild pain.   Past Month  ? metoprolol tartrate (LOPRESSOR) 50 MG tablet TAKE 1 TABLET BY MOUTH TWICE A DAY (Patient taking differently: Take 50 mg by mouth 2 (two) times daily.) 180 tablet 3 08/30/2021 at 0700  ? nitroGLYCERIN (NITROSTAT) 0.4 MG SL tablet Take one tablet by mouth as needed for exertional chest pain discomfort.  May take 1 tablet by mouth daily. (Patient taking  differently: Place 0.4 mg under the tongue every 5 (five) minutes as needed for chest pain.) 60 tablet 1 08/30/2021  ? ? ?Assessment: ?41 YOF with Afib to start IV heparin for Afib. Patient now s/p cath with no CAD found. Discussed with on call cardiology will restart heparin post cath and follow up anticoagulation plans in am. No hematoma noted post cath.  ? ?Goal of Therapy:  ?Heparin level 0.3-0.7 units/ml ?Monitor platelets by anticoagulation protocol: Yes ?  ?Plan:  ?Restart heparin at 1400 units/hr at midnight ?Check heparin level in am ? ?59 PharmD., BCPS ?Clinical Pharmacist ?08/30/2021 10:43 PM ? ?

## 2021-08-30 NOTE — ED Notes (Signed)
RN updated, Casimiro Needle husband.  ?

## 2021-08-30 NOTE — Progress Notes (Signed)
ANTICOAGULATION CONSULT NOTE - Initial Consult ? ?Pharmacy Consult for heparin ?Indication: atrial fibrillation ? ?Allergies  ?Allergen Reactions  ? Penicillins Anaphylaxis, Shortness Of Breath and Swelling  ?  Has patient had a PCN reaction causing immediate rash, facial/tongue/throat swelling, SOB or lightheadedness with hypotension: Yes ?Has patient had a PCN reaction causing severe rash involving mucus membranes or skin necrosis: No ?Has patient had a PCN reaction that required hospitalization: While hospitalized ?Has patient had a PCN reaction occurring within the last 10 years: No ?If all of the above answers are "NO", then may proceed with Cephalosporin use. ?  ? Bee Venom Swelling and Other (See Comments)  ?  Swelling and hardness at the site of bee sting.  ? ? ?Patient Measurements: ?Height: 5\' 9"  (175.3 cm) ?Weight: 120.2 kg (265 lb) ?IBW/kg (Calculated) : 66.2 ?Heparin Dosing Weight: 94 kg  ? ?Vital Signs: ?Temp: 97.7 ?F (36.5 ?C) (03/02 [id number]) ?Temp Source: Oral (03/02 [id number]) ?BP: 107/49 (03/02 1015) ?Pulse Rate: 60 (03/02 1015) ? ?Labs: ?Recent Labs  ?  08/30/21 ?0843  ?HGB 14.7  ?HCT 45.0  ?PLT 316  ?CREATININE 0.90  ?TROPONINIHS 14  ? ? ?Estimated Creatinine Clearance: 96.7 mL/min (by C-G formula based on SCr of 0.9 mg/dL). ? ? ?Medical History: ?Past Medical History:  ?Diagnosis Date  ? Atrial fibrillation with RVR ([place]) 12/26/2016  ? Sepsis ([place])   ? WPW (Wolff-Parkinson-White syndrome) 12/26/2016  ? ? ?Medications:  ?(Not in a hospital admission)  ? ?Assessment: ?52 YOF with Afib to start IV heparin for Afib. ? ?H/H and Plt wnl. SCr wnl  ? ?Goal of Therapy:  ?Heparin level 0.3-0.7 units/ml ?Monitor platelets by anticoagulation protocol: Yes ?  ?Plan:  ?-Heparin 4000 units IV bolus followed by heparin infusion at 1400 units/hr ?-F/u 6 hr HL ?-Monitor daily HL, CBC and s/s of bleeding  ? ?Albertina Parr, PharmD., BCCCP ?Clinical Pharmacist ?Please refer to AMION for unit-specific pharmacist   ? ? ? ?

## 2021-08-30 NOTE — H&P (Addendum)
Cardiology Admission History and Physical:   Patient ID: [name] MRN: [id number]; DOB: May 15, 1966   Admission date: 08/30/2021  PCP:  Evans Lance, MD   [facility] Providers Cardiologist:  Dr. Lovena Le   Chief Complaint:  Afib  Patient Profile:   [name] is a 56 y.o. female with HTN, obesity, Afib who is being seen 08/30/2021 for the evaluation of Afib w/RVR.   AFib hx Diagnosed June 2008 (post op after a hysterectomy) converted with amio Again found June 2018  There was mention of possible WPW by EKG when 1st diagnosed with Afib Underwent EPS 02/24/2017 Pacing at 260 ms resulted in the induction of atrial fibrillation. While sedated, the patient's ventricular rate was nearly 200 beats a minute. There was variability in the QRS morphology, but there was no clear-cut evidence of accessory pathway conduction Conclusion: Invasive EP study with evidence of no accessory pathway conduction, there was inducible atrial fibrillation, and the patient was successfully return to sinus rhythm, with DC cardioversion  AAD Hx Amiodarone briefly in 2018 in-pt Flecainide started June 2018  History of Present Illness:   [name] last saw Dr.Taylor Nov 2022, in the year she had about a dozen episodes of palpitations/Afib most 30 minutes or so, once reportedly lasting longer and went to the ER.  Discussed treatment options, decided to stay the course with her flecainide and BB regime  08/03/21 had an ER visit with sudden onset of palpitations and CP , SOB, lightheaded,  She was found in AFib w/RVR, treated with diltiazem gtt and had spontaneous conversion to SR HS Trop neg x2 Labs unremarkable Discharged from the ER to follow up with cardiology, revisit AFib management and a/c  She comes back to the ER via EMS with again sudden onset of palpitations, CP, she had been in the shower, started about 0500 this [facility] unavailable, in the ER has been given metoprolol 5mg   IV and dig 0.125 mg IV with improved HR transiently Dr. Quentin Ore called and went to bedside, noted recurrent RVR and amiodarone gtt started EKG reviewed with global ischemic picture, HS Trop #1 neg at 14  LABS K+ 3.7 BUN/Creat 14/0.90 HS Trop 14 WBC 10.4 H/H 14/45 Plts 316  With some improvement in her HR she is feeling improved.  Nausea is gon, no CP, still a little heavy in her chest.  No SOB Hr 110's-120's, most recent BPs 108/67,  107/49  Past Medical History:  Diagnosis Date   Atrial fibrillation with RVR ([place]) 12/26/2016   Sepsis ([place])    WPW (Wolff-Parkinson-White syndrome) 12/26/2016    Past Surgical History:  Procedure Laterality Date   ABDOMINAL HYSTERECTOMY     KNEE SURGERY     V TACH ABLATION N/A 02/24/2017   Procedure: V Tach Ablation WPW Ablation;  Surgeon: Evans Lance, MD;  Location: [facility];  Service: Cardiovascular;  Laterality: N/A;     Medications Prior to Admission: Prior to Admission medications   Medication Sig Start Date End Date Taking? Authorizing Provider  flecainide (TAMBOCOR) 100 MG tablet TAKE 1 TABLET BY MOUTH TWICE A DAY Patient taking differently: Take 100 mg by mouth 2 (two) times daily. 05/29/21  Yes Evans Lance, MD  ibuprofen (ADVIL,MOTRIN) 200 MG tablet Take 600 mg by mouth daily as needed for mild pain.   Yes [provider]  metoprolol tartrate (LOPRESSOR) 50 MG tablet TAKE 1 TABLET BY MOUTH TWICE A DAY Patient taking differently: Take 50 mg by mouth 2 (  two) times daily. 06/01/21  Yes Evans Lance, MD  nitroGLYCERIN (NITROSTAT) 0.4 MG SL tablet Take one tablet by mouth as needed for exertional chest pain discomfort.  May take 1 tablet by mouth daily. Patient taking differently: Place 0.4 mg under the tongue every 5 (five) minutes as needed for chest pain. 05/23/20  Yes Evans Lance, MD     Allergies:    Allergies  Allergen Reactions   Penicillins Anaphylaxis, Shortness Of Breath and Swelling    Has  patient had a PCN reaction causing immediate rash, facial/tongue/throat swelling, SOB or lightheadedness with hypotension: Yes Has patient had a PCN reaction causing severe rash involving mucus membranes or skin necrosis: No Has patient had a PCN reaction that required hospitalization: While hospitalized Has patient had a PCN reaction occurring within the last 10 years: No If all of the above answers are "NO", then may proceed with Cephalosporin use.    Bee Venom Swelling and Other (See Comments)    Swelling and hardness at the site of bee sting.    Social History:   Social History   Socioeconomic History   Marital status: Single    Spouse name: Not on file   Number of children: Not on file   Years of education: Not on file   Highest education level: Not on file  Occupational History   Not on file  Tobacco Use   Smoking status: Never   Smokeless tobacco: Never  Vaping Use   Vaping Use: Never used  Substance and Sexual Activity   Alcohol use: Yes    Comment: occ   Drug use: No   Sexual activity: Not on file  Other Topics Concern   Not on file  Social History Narrative   Not on file   Social Determinants of Health   Financial Resource Strain: Not on file  Food Insecurity: Not on file  Transportation Needs: Not on file  Physical Activity: Not on file  Stress: Not on file  Social Connections: Not on file  Intimate Partner Violence: Not on file    Family History:   The patient's family history includes Diabetes Mellitus II in her mother; Heart attack in her father; Heart failure in her mother; Stroke in her father.    ROS:  Please see the history of present illness.  All other ROS reviewed and negative.     Physical Exam/Data:   Vitals:   08/30/21 0823 08/30/21 0830 08/30/21 0850 08/30/21 0930  BP:  (!) 113/59 112/88 (!) 89/60  Pulse:  (!) 170 73 61  Resp:  (!) 23 20 (!) 9  Temp: 97.7 F (36.5 C)     TempSrc: Oral     SpO2:  93% 93% 97%  Weight: 120.2 kg      Height: 5\' 9"  (1.753 m)      No intake or output data in the 24 hours ending 08/30/21 0954 Last 3 Weights 08/30/2021 08/03/2021 05/29/2021  Weight (lbs) 265 lb 276 lb 0.3 oz 276 lb  Weight (kg) 120.203 kg 125.2 kg 125.193 kg     Body mass index is 39.13 kg/m.  General:  Well nourished, well developed, in no acute distress HEENT: normal Neck: no JVD Vascular: No carotid bruits; Distal pulses 2+ bilaterally   Cardiac:  irreg-irreg, tachycardic; no murmurs, gallops or rubs Lungs:  CTA b/l, no wheezing, rhonchi or rales  Abd: soft, nontender, no hepatomegaly  Ext: no edema Musculoskeletal:  No deformities, BUE and BLE strength normal and  equal Skin: warm and dry  Neuro:  CNs 2-12 intact, no focal abnormalities noted Psych:  Normal affect    EKG:  The ECG that was done  was personally reviewed and demonstrates Aflutter 171bpm, global ischemic pattern with STE AVR, depressions elsewhere  Relevant CV Studies:  02/24/2017: EPS Invasive EP study with evidence of no accessory pathway conduction, there was inducible atrial fibrillation, and the patient was successfully return to sinus rhythm, with DC cardioversion.   01/22/2017: stress myoview Nuclear stress EF: 53%. There was no ST segment deviation noted during stress. There is a large defect of moderate severity present in the basal anteroseptal, mid anterior, mid anteroseptal, apical anterior and apical septal location. The defect is non-reversible. This is consistent with breast attenuation artifact. No ischemia noted. This is a low risk study. The left ventricular ejection fraction is mildly decreased (45-54%).  12/26/2016; TTE Study Conclusions  - Left ventricle: Wall thickness was increased in a pattern of    moderate LVH. Systolic function was normal. The estimated    ejection fraction was in the range of 60% to 65%. Left    ventricular diastolic function parameters were normal.  - Aortic valve: There was trivial regurgitation.   - Pulmonary arteries: PA peak pressure: 35 mm Hg (S).    Laboratory Data:  High Sensitivity Troponin:   Recent Labs  Lab 08/03/21 1724 08/03/21 1912 08/30/21 0843  TROPONINIHS 5 6 14       Chemistry Recent Labs  Lab 08/30/21 0843  NA 140  K 3.7  CL 106  CO2 24  GLUCOSE 201*  BUN 14  CREATININE 0.90  CALCIUM 9.0  GFRNONAA >60  ANIONGAP 10    Recent Labs  Lab 08/30/21 0843  PROT 7.0  ALBUMIN 3.5  AST 33  ALT 28  ALKPHOS 56  BILITOT 0.4   Lipids No results for input(s): CHOL, TRIG, HDL, LABVLDL, LDLCALC, CHOLHDL in the last 168 hours. Hematology Recent Labs  Lab 08/30/21 0843  WBC 10.4  RBC 5.04  HGB 14.7  HCT 45.0  MCV 89.3  MCH 29.2  MCHC 32.7  RDW 13.4  PLT 316   Thyroid No results for input(s): TSH, FREET4 in the last 168 hours. BNPNo results for input(s): BNP, PROBNP in the last 168 hours.  DDimer No results for input(s): DDIMER in the last 168 hours.   Radiology/Studies:  DG Chest Port 1 View  Result Date: 08/30/2021 CLINICAL DATA:  Palpitation. EXAM: PORTABLE CHEST 1 VIEW COMPARISON:  August 03, 2021. FINDINGS: Stable cardiomegaly with mild central pulmonary vascular congestion. Lungs are clear. Bony thorax is unremarkable. IMPRESSION: Stable cardiomegaly with mild central pulmonary vascular congestion. Electronically Signed   By: Marijo Conception M.D.   On: 08/30/2021 09:06     Assessment and Plan:   Paroxysmal AFib AFlutter (atypical) CHA2DS2Vasc is one (2 w/gender) RVR Stop flecainide Amiodarone gtt, BB  CP Abnormal EKG, suspect 2/2 RVR Follow Trop trend Heparin gtt Cath this admission, not urgently unless HS Trop #2 makes a sharp rise She is feeling improved with better rate control  HTN Hypotension w/RVR Follow  ADDEND: Second Trop 130 EKG is improved Pt continues to feel improved, still a little heavy but much better. Discussed cardiac cath and rational for it, discussed the procedure, potential risks/benefits, she is  agreeable, we will proceed today as schedule allows Tommye Standard, PA-C   Risk Assessment/Risk Scores:    For questions or updates, please contact [place] Please consult [email or web address] for contact  info under     Signed, Baldwin Jamaica, PA-C  08/30/2021 9:54 AM

## 2021-08-30 NOTE — Progress Notes (Signed)
TR BAND REMOVAL ? ?LOCATION:    Right radial ? ?DEFLATED PER PROTOCOL:    Yes.   ? ?TIME BAND OFF / DRESSING APPLIED:    2015pm . A clean dry dressing applied with gauze and tegaderm ? ?SITE UPON ARRIVAL:    Level 0 ? ?SITE AFTER BAND REMOVAL:    Level 0 ? ?CIRCULATION SENSATION AND MOVEMENT:    Within Normal Limits   Yes.   ? ?COMMENTS:   Care instruction given to patient  ?

## 2021-08-31 ENCOUNTER — Other Ambulatory Visit (HOSPITAL_COMMUNITY): Payer: Self-pay

## 2021-08-31 ENCOUNTER — Encounter (HOSPITAL_COMMUNITY): Payer: Self-pay | Admitting: Internal Medicine

## 2021-08-31 LAB — HEPARIN LEVEL (UNFRACTIONATED): Heparin Unfractionated: <0.1 IU/mL — ABNORMAL LOW (ref 0.30–0.70)

## 2021-08-31 LAB — BASIC METABOLIC PANEL
Anion gap: 8 (ref 5–15)
BUN: 15 mg/dL (ref 6–20)
CO2: 26 mmol/L (ref 22–32)
Calcium: 8.8 mg/dL — ABNORMAL LOW (ref 8.9–10.3)
Chloride: 105 mmol/L (ref 98–111)
Creatinine, Ser: 0.65 mg/dL (ref 0.44–1.00)
GFR, Estimated: >60 mL/min (ref >60)
Glucose, Bld: 118 mg/dL — ABNORMAL HIGH (ref 70–99)
Potassium: 3.5 mmol/L (ref 3.5–5.1)
Sodium: 139 mmol/L (ref 135–145)

## 2021-08-31 LAB — CBC
HCT: 37.9 % (ref 36.0–46.0)
Hemoglobin: 12.5 g/dL (ref 12.0–15.0)
MCH: 29.1 pg (ref 26.0–34.0)
MCHC: 33 g/dL (ref 30.0–36.0)
MCV: 88.1 fL (ref 80.0–100.0)
Platelets: 228 10*3/uL (ref 150–400)
RBC: 4.3 MIL/uL (ref 3.87–5.11)
RDW: 13.9 % (ref 11.5–15.5)
WBC: 8.4 10*3/uL (ref 4.0–10.5)
nRBC: 0 % (ref 0.0–0.2)

## 2021-08-31 MED ORDER — AMIODARONE HCL 200 MG PO TABS
400.0000 mg | ORAL_TABLET | Freq: Two times a day (BID) | ORAL | Status: DC
  Administered 2021-08-31: 400 mg via ORAL
  Filled 2021-08-31: qty 2

## 2021-08-31 MED ORDER — AMIODARONE HCL 200 MG PO TABS
400.0000 mg | ORAL_TABLET | Freq: Two times a day (BID) | ORAL | 0 refills | Status: DC
  Filled 2021-08-31: qty 20, 5d supply, fill #0

## 2021-08-31 MED ORDER — AMIODARONE HCL 200 MG PO TABS: 200.0000 mg | ORAL_TABLET | Freq: Every day | ORAL | 5 refills | Status: DC

## 2021-08-31 MED ORDER — APIXABAN 5 MG PO TABS
5.0000 mg | ORAL_TABLET | Freq: Two times a day (BID) | ORAL | Status: DC
Start: 2021-08-31 — End: 2021-08-31
  Administered 2021-08-31: 5 mg via ORAL
  Filled 2021-08-31: qty 1

## 2021-08-31 MED ORDER — APIXABAN 5 MG PO TABS
5.0000 mg | ORAL_TABLET | Freq: Two times a day (BID) | ORAL | 0 refills | Status: DC
  Filled 2021-08-31: qty 60, 30d supply, fill #0

## 2021-08-31 NOTE — TOC Transition Note (Addendum)
Transition of Care (TOC) - CM/SW Discharge Note ? ? ?Patient Details  ?Name: [name] ?MRN: [id number] ?Date of Birth: 1965-12-21 ? ?Transition of Care (TOC) CM/SW Contact:  ?Leone Haven, RN ?Phone Number: ?08/31/2021, 10:43 AM ? ? ?Clinical Narrative:    ?Patient is for dc today, she states her spouse will be transporting her home.  NCM gave her patient ast application for eliquis, she has [facility] up [facility] where she can get asst with eliqius and other medications if needed and establish a PCP.  While waiting on patient ast for eliqius she can get refill for 10.00 at [facility] clinic if needed. ? ? ?Final next level of care: Home/Self Care ?Barriers to Discharge: No Barriers Identified ? ? ?Patient Goals and CMS Choice ?Patient states their goals for this hospitalization and ongoing recovery are:: return home ?  ?Choice offered to / list presented to : NA ? ?Discharge Placement ?  ?           ?  ?  ?  ?  ? ?Discharge Plan and Services ?  ?  ?           ?  ?DME Agency: NA ?  ?  ?  ?HH Arranged: NA ?  ?  ?  ?  ? ?Social Determinants of Health (SDOH) Interventions ?  ? ? ?Readmission Risk Interventions ?No flowsheet data found. ? ? ? ? ?

## 2021-08-31 NOTE — Discharge Summary (Signed)
? ? ? ?DISCHARGE SUMMARY  ? ? ?Patient ID: [name],  ?MRN: [id number], DOB/AGE: [birth year] 56 y.o. ? ?Admit date: 08/30/2021 ?Discharge date: 08/31/2021 ? ?Primary Care Physician: Evans Lance, MD  ?Primary Cardiologist/Electrophysiologist: Dr. Lovena Le ? ?Primary Discharge Diagnosis:  ?AFlutter w/RVR ?Paroxysmal Afib ?CHA2DS2Vasc is one ?CP ?NSTEMI ? ?Secondary Discharge Diagnosis:  ?HTN ?Obesity ? ?Allergies  ?Allergen Reactions  ? Penicillins Anaphylaxis, Shortness Of Breath and Swelling  ?  Has patient had a PCN reaction causing immediate rash, facial/tongue/throat swelling, SOB or lightheadedness with hypotension: Yes ?Has patient had a PCN reaction causing severe rash involving mucus membranes or skin necrosis: No ?Has patient had a PCN reaction that required hospitalization: While hospitalized ?Has patient had a PCN reaction occurring within the last 10 years: No ?If all of the above answers are "NO", then may proceed with Cephalosporin use. ?  ? Bee Venom Swelling and Other (See Comments)  ?  Swelling and hardness at the site of bee sting.  ? ? ? ?Procedures This Admission:  ?1.   ? ?Brief HPI: ?[name] is a 56 y.o. female has PMHx of HTN, PAFib followed outpatient with EP, D. Lovena Le, maintained ofn flecainide and metoprolol for several years though of late with increasing burden of arrhythmia, ER visits.  Brought to the ER yesterday via EMS with sudden onset of palpitations, CP, she had been in the shower, started about 0500 yesterday AM EMS record unavailable, in the ER has been given metoprolol 5mg  IV and dig 0.125 mg IV with improved HR transiently ?Dr. Quentin Ore called and went to bedside, noted recurrent RVR and amiodarone gtt started ?EKG reviewed with global ischemic picture, HS Trop #1 neg at 14 ? ?Hospital Course:  ?The patient was admitted started on amiodarone gtt with improved rate control and improved symptoms.  Her BP improved as well, #2 HS Trop was elevated c/w NSTEMI and  planned for cath that noted no CAD.   She was monitored on telemetry overnight which demonstrated SR.  R wrist site was without hematoma or ecchymosis.   ?Wound care, arm mobility, and restrictions were reviewed with the patient.  The patient feels well, denies any P/SOB,  She was examined by Dr. Quentin Ore and considered stable for discharge to home.  ? ?CHA2DS2Vasc is one, she converted with amiodarone to SR and will have her take Eliquis 5mg  BID for 1 mo ?Amiodarone 400mg  BID x5 days then 200mg  daily ? ?Dr. Quentin Ore discussed that given the severity of her symptoms and rates with her AFib/flutter, ablation is the more durable and recommended management for her with amiodarone as a bridge to her procedure, and short term management strategy only. ?She is currently uninsured, so hopefully she can work on finding affordable coverage in the next weeks, months and we can push forward to that goal. ? ? ?Physical Exam: ?Vitals:  ? 08/30/21 2145 08/30/21 2220 08/31/21 0007 08/31/21 0400  ?BP: (!) 134/50 130/66 (!) 118/52 (!) 130/52  ?Pulse: 63 64 67 64  ?Resp: 16  16 18   ?Temp:   98.2 ?F (36.8 ?C) 98.3 ?F (36.8 ?C)  ?TempSrc:   Oral Oral  ?SpO2: 96% 98% 98% 99%  ?Weight: 124.8 kg   123.7 kg  ?Height:      ? ? ?GEN- The patient is well appearing, alert and oriented x 3 today.   ?HEENT: normocephalic, atraumatic; sclera clear, conjunctiva pink; hearing intact; oropharynx clear; neck supple, no JVP ?Lungs- CTA b/l, normal work of breathing.  No wheezes,  rales, rhonchi ?Heart- RRR, no murmurs, rubs or gallops, PMI not laterally displaced ?GI- soft, non-tender, non-distended ?Extremities- no clubbing, cyanosis, or edema, R wrist is stable, no bleeding hematoma, excellent radial pulse ?MS- no significant deformity or atrophy ?Skin- warm and dry, no rash or lesion ?Psych- euthymic mood, full affect ?Neuro- no gross deficits ? ? ?Labs: ?  ?Lab Results  ?Component Value Date  ? WBC 8.4 08/31/2021  ? HGB 12.5 08/31/2021  ? HCT 37.9  08/31/2021  ? MCV 88.1 08/31/2021  ? PLT 228 08/31/2021  ?  ?Recent Labs  ?Lab 08/30/21 ?[id number] 08/31/21 ?0744  ?NA 140 139  ?K 3.7 3.5  ?CL 106 105  ?CO2 24 26  ?BUN 14 15  ?CREATININE 0.90 0.65  ?CALCIUM 9.0 8.8*  ?PROT 7.0  --   ?BILITOT 0.4  --   ?ALKPHOS 56  --   ?ALT 28  --   ?AST 33  --   ?GLUCOSE 201* 118*  ? ? ?Discharge Medications:  ?Allergies as of 08/31/2021   ? ?   Reactions  ? Penicillins Anaphylaxis, Shortness Of Breath, Swelling  ? Has patient had a PCN reaction causing immediate rash, facial/tongue/throat swelling, SOB or lightheadedness with hypotension: Yes ?Has patient had a PCN reaction causing severe rash involving mucus membranes or skin necrosis: No ?Has patient had a PCN reaction that required hospitalization: While hospitalized ?Has patient had a PCN reaction occurring within the last 10 years: No ?If all of the above answers are "NO", then may proceed with Cephalosporin use.  ? Bee Venom Swelling, Other (See Comments)  ? Swelling and hardness at the site of bee sting.  ? ?  ? ?  ?Medication List  ?  ? ?STOP taking these medications   ? ?ibuprofen 200 MG tablet ?Commonly known as: ADVIL ?  ? ?  ? ?TAKE these medications   ? ?amiodarone 400 MG tablet ?Commonly known as: PACERONE ?Take 1 tablet (400 mg total) by mouth 2 (two) times daily for 5 days. ?  ?amiodarone 200 MG tablet ?Commonly known as: Pacerone ?Take 1 tablet (200 mg total) by mouth daily. ?Start taking on: September 06, 2021 ?  ?apixaban 5 MG Tabs tablet ?Commonly known as: ELIQUIS ?Take 1 tablet (5 mg total) by mouth 2 (two) times daily. ?  ?metoprolol tartrate 50 MG tablet ?Commonly known as: LOPRESSOR ?TAKE 1 TABLET BY MOUTH TWICE A DAY ?  ?nitroGLYCERIN 0.4 MG SL tablet ?Commonly known as: NITROSTAT ?Take one tablet by mouth as needed for exertional chest pain discomfort.  May take 1 tablet by mouth daily. ?What changed:  ?how much to take ?how to take this ?when to take this ?reasons to take this ?additional instructions ?  ? ?   ? ? ?Disposition: Home ?Discharge Instructions   ? ? Diet - low sodium heart healthy   Complete by: As directed ?  ? Increase activity slowly   Complete by: As directed ?  ? ?  ? ? Follow-up Information   ? ? Vickie Epley, MD Follow up.   ?Specialties: Cardiology, Radiology ?Why: 09/26/21 @ 4:15PM ?Contact information: ?[address] ?[place] [address] ?[phone] ? ? ?  ?  ? ?  ?  ? ?  ? ? ?Duration of Discharge Encounter: Greater than 30 minutes including physician time. ? ?Signed, ?Tommye Standard, PA-C ?08/31/2021 ?10:19 AM ? ? ? ?

## 2021-08-31 NOTE — Discharge Instructions (Addendum)

## 2021-08-31 NOTE — Plan of Care (Signed)
DISCHARGE NOTE HOME ?[name] to be discharged home per MD order. Discussed prescriptions and follow up appointments with the patient. Prescriptions given to patient; medication list explained in detail. Patient verbalized understanding. ? ?Skin clean, dry and intact without evidence of skin break down, no evidence of skin tears noted. IV catheter discontinued intact. Site without signs and symptoms of complications. Dressing and pressure applied. Pt denies pain at the site currently. No complaints noted. ? ?Patient free of lines, drains, and wounds.  ? ?An After Visit Summary (AVS) was printed and given to the patient. ?Patient to be escorted via wheelchair, and discharged home via private auto. ? ?Arlice Colt, RN  ?

## 2021-08-31 NOTE — TOC Progression Note (Signed)
Transition of Care (TOC) - Progression Note  ? ? ?Patient Details  ?Name: [name] ?MRN: [id number] ?Date of Birth: 1966/04/12 ? ?Transition of Care (TOC) CM/SW Contact  ?Leone Haven, RN ?Phone Number: ?08/31/2021, 8:56 AM ? ?Clinical Narrative:    ?From home, NSTEMI, SOB, Afib, conts on heparin drip, s/p cath.  TOC will continue to follow for dc needs. ? ? ?  ?  ? ?Expected Discharge Plan and Services ?  ?  ?  ?  ?  ?                ?  ?  ?  ?  ?  ?  ?  ?  ?  ?  ? ? ?Social Determinants of Health (SDOH) Interventions ?  ? ?Readmission Risk Interventions ?No flowsheet data found. ? ?

## 2021-09-06 ENCOUNTER — Telehealth (HOSPITAL_COMMUNITY): Payer: Self-pay | Admitting: Pharmacist

## 2021-09-06 ENCOUNTER — Other Ambulatory Visit (HOSPITAL_COMMUNITY): Payer: Self-pay

## 2021-09-06 NOTE — Telephone Encounter (Signed)
Pharmacy Transitions of Care Follow-up Telephone Call ? ?Date of discharge: 08/31/21  ?Discharge Diagnosis: Afib ? ?Medication changes made at discharge: ? - START:  ?amiodarone (PACERONE)  ?Eliquis (apixaban)  ? - STOPPED:  ?ibuprofen 200 MG tablet (ADVIL)  ? ? ?Medication changes verified by the patient? Yes ?  ? ?Medication Accessibility: ? ?[facility] [place], [place]  ? ?Was the patient provided with refills on discharged medications? No, Eliquis is 1 month for now, with MD to reassess at follow-up, pt will get MAP with [facility].  ?[facility] ? ?Have all prescriptions been transferred from [facility] to home pharmacy? N/a  ? ?Is the patient able to afford medications?  ?Notable copays: amiodarone is generic ?Eligible patient assistance: MAP for eliquis ?  ? ?Medication Review: ?  ?APIXABAN (ELIQUIS)  ?Apixaban 5 mg BID on 08/31/21 ?- Discussed importance of taking medication around the same time everyday  ?- Reviewed potential DDIs with patient  ?- Advised patient of medications to avoid (NSAIDs, ASA)  ?- Educated that Tylenol (acetaminophen) will be the preferred analgesic to prevent risk of bleeding  ?- Emphasized importance of monitoring for signs and symptoms of bleeding (abnormal bruising, prolonged bleeding, nose bleeds, bleeding from gums, discolored urine, black tarry stools)  ?- Advised patient to alert all providers of anticoagulation therapy prior to starting a new medication or having a procedure  ? ?Follow-up Appointments: ? ?PCP Hospital f/u appt confirmed?  Scheduled to see [place] on 09/07/21.  ? ?[facility] f/u appt confirmed?  Scheduled to see Dr. Quentin Ore, cardiology on 09/26/21.  ? ?If their condition worsens, is the pt aware to call PCP or go to the Emergency Dept.? yes ? ?Final Patient Assessment: ?Pt is doing well, but was unsure if she was taking eliquis once or twice daily.  She did not  have access to her rx bottle but will check it when she gets home and will adjust to twice daily dosing if needed.  No bleeding/bruising.  Pt will take MAP application to [facility] for assistance and refills.  Aware of upcoming appointments.  ? ?

## 2021-09-06 NOTE — Telephone Encounter (Signed)
Pharmacy Transitions of Care Follow-up Telephone Call ? ?Date of discharge: 08/31/21  ?Discharge Diagnosis: Afib ? ?Medication changes made at discharge: ? - START:  ?amiodarone (PACERONE)  ?Eliquis (apixaban)  ? - STOPPED:  ?ibuprofen 200 MG tablet (ADVIL)  ? ? ?Medication changes verified by the patient? Yes ?  ? ?Medication Accessibility: ? ?[facility] [place], [place]  ? ?Was the patient provided with refills on discharged medications? No, Eliquis is 1 month duration  ?[facility] ? ?Have all prescriptions been transferred from [facility] to home pharmacy? N/a  ? ?Is the patient able to afford medications?  ?Notable copays: amiodarone is generic ?Eligible patient assistance: n/a ?  ? ?Medication Review: ?  ?APIXABAN (ELIQUIS)  ?Apixaban 5 mg BID on 08/31/21 ?- Discussed importance of taking medication around the same time everyday  ?- Reviewed potential DDIs with patient  ?- Advised patient of medications to avoid (NSAIDs, ASA)  ?- Educated that Tylenol (acetaminophen) will be the preferred analgesic to prevent risk of bleeding  ?- Emphasized importance of monitoring for signs and symptoms of bleeding (abnormal bruising, prolonged bleeding, nose bleeds, bleeding from gums, discolored urine, black tarry stools)  ?- Advised patient to alert all providers of anticoagulation therapy prior to starting a new medication or having a procedure  ? ?Follow-up Appointments: ? ?PCP Hospital f/u appt confirmed?  Scheduled to see [facility] on 09/07/21.  ? ?[facility] f/u appt confirmed?  Scheduled to see Dr. Lalla Brothers, cardiology on 09/26/21.  ? ?If their condition worsens, is the pt aware to call PCP or go to the Emergency Dept.? yes ? ?Final Patient Assessment: ? ? ?

## 2021-09-07 ENCOUNTER — Ambulatory Visit: Payer: Self-pay | Admitting: Nurse Practitioner

## 2021-09-07 ENCOUNTER — Other Ambulatory Visit: Payer: Self-pay

## 2021-09-25 NOTE — Progress Notes (Signed)
?Electrophysiology Office Follow up Visit Note:   ? ?Date:  09/26/2021  ? ?ID:  [name], DOB August 21, 1965, MRN [id number] ? ?PCP:  Evans Lance, MD  ?[facility] Electrophysiologist:  Vickie Epley, MD  ? ? ?Interval History:   ? ?[name] is a 56 y.o. female who presents for a follow up visit.  ?I met her 08/30/2021 when she presented to the hospital with highly symptomatic AF w/ RVR. She was previously followed by Dr Lovena Le and was prescribed flecainide. We stopped the flecainide and started amiodarone as a bridge to ablation. She presents today for follow up. ? ?She has done much better on amiodarone. She is tolerating the medication without side effects. She did have a recent breakthrough episode of atrial fibrillation that improved when she took additional metoprolol. During the episode she felt very poor and was considering presenting to the emergency room. She remains uninsured. ? ? ? ?  ? ?Past Medical History:  ?Diagnosis Date  ? Atrial fibrillation with RVR ([place]) 12/26/2016  ? Sepsis ([place])   ? WPW (Wolff-Parkinson-White syndrome) 12/26/2016  ? ? ?Past Surgical History:  ?Procedure Laterality Date  ? ABDOMINAL HYSTERECTOMY    ? KNEE SURGERY    ? LEFT HEART CATH AND CORONARY ANGIOGRAPHY N/A 08/30/2021  ? Procedure: LEFT HEART CATH AND CORONARY ANGIOGRAPHY;  Surgeon: Early Osmond, MD;  Location: [place] CV LAB;  Service: Cardiovascular;  Laterality: N/A;  ? V TACH ABLATION N/A 02/24/2017  ? Procedure: V Tach Ablation WPW Ablation;  Surgeon: Evans Lance, MD;  Location: [place] CV LAB;  Service: Cardiovascular;  Laterality: N/A;  ? ? ?Current Medications: ?Current Meds  ?Medication Sig  ? amiodarone (PACERONE) 200 MG tablet Take 1 tablet (200 mg total) by mouth daily.  ? aspirin EC 81 MG tablet Take 1 tablet (81 mg total) by mouth daily. Swallow whole.  ? metoprolol succinate (TOPROL-XL) 50 MG 24 hr tablet Take 1 tablet (50 mg total) by mouth in the morning and at bedtime.  Take with or immediately following a meal.  ? nitroGLYCERIN (NITROSTAT) 0.4 MG SL tablet Take one tablet by mouth as needed for exertional chest pain discomfort.  May take 1 tablet by mouth daily. (Patient taking differently: Place 0.4 mg under the tongue every 5 (five) minutes as needed for chest pain.)  ? [DISCONTINUED] apixaban (ELIQUIS) 5 MG TABS tablet Take 1 tablet (5 mg total) by mouth 2 (two) times daily.  ? [DISCONTINUED] metoprolol tartrate (LOPRESSOR) 50 MG tablet TAKE 1 TABLET BY MOUTH TWICE A DAY (Patient taking differently: Take 50 mg by mouth 2 (two) times daily.)  ?  ? ?Allergies:   Penicillins and Bee venom  ? ?Social History  ? ?Socioeconomic History  ? Marital status: Married  ?  Spouse name: Not on file  ? Number of children: Not on file  ? Years of education: Not on file  ? Highest education level: Not on file  ?Occupational History  ? Not on file  ?Tobacco Use  ? Smoking status: Never  ? Smokeless tobacco: Never  ?Vaping Use  ? Vaping Use: Never used  ?Substance and Sexual Activity  ? Alcohol use: Yes  ?  Comment: occ  ? Drug use: No  ? Sexual activity: Not on file  ?Other Topics Concern  ? Not on file  ?Social History Narrative  ? Not on file  ? ?Social Determinants of Health  ? ?Financial Resource Strain: Not on file  ?Food Insecurity:  Not on file  ?Transportation Needs: Not on file  ?Physical Activity: Not on file  ?Stress: Not on file  ?Social Connections: Not on file  ?  ? ?Family History: ?The patient's family history includes Diabetes Mellitus II in her mother; Heart attack in her father; Heart failure in her mother; Stroke in her father. ? ?ROS:   ?Please see the history of present illness.    ?All other systems reviewed and are negative. ? ?EKGs/Labs/Other Studies Reviewed:   ? ?The following studies were reviewed today: ? ?08/30/2021 Echo ?EF50-55% ?RV normal ?Mildly dilated LA ? ?08/30/2021 LHC ?Normal coronaries ? ? ? ? ? ?Recent Labs: ?08/03/2021: B Natriuretic Peptide 594.7; Magnesium  2.1 ?08/30/2021: ALT 28 ?08/31/2021: BUN 15; Creatinine, Ser 0.65; Hemoglobin 12.5; Platelets 228; Potassium 3.5; Sodium 139  ?Recent Lipid Panel ?   ?Component Value Date/Time  ? CHOL 158 12/25/2016 0445  ? TRIG 158 (H) 12/25/2016 0445  ? HDL 33 (L) 12/25/2016 0445  ? CHOLHDL 4.8 12/25/2016 0445  ? VLDL 32 12/25/2016 0445  ? [place] 93 12/25/2016 0445  ? ? ?Physical Exam:   ? ?VS:  BP 132/70   Pulse 84   Ht _0  (1.753 m)   Wt 275 lb 12.8 oz (125.1 kg)   SpO2 94%   BMI 40.73 kg/m?    ? ?Wt Readings from Last 3 Encounters:  ?09/26/21 275 lb 12.8 oz (125.1 kg)  ?08/31/21 272 lb 11.3 oz (123.7 kg)  ?08/03/21 276 lb 0.3 oz (125.2 kg)  ?  ? ?GEN:  Well nourished, well developed in no acute distress. Obese. ?HEENT: Normal ?NECK: No JVD; No carotid bruits ?LYMPHATICS: No lymphadenopathy ?CARDIAC: RRR, no murmurs, rubs, gallops ?RESPIRATORY:  Clear to auscultation without rales, wheezing or rhonchi  ?ABDOMEN: Soft, non-tender, non-distended ?MUSCULOSKELETAL:  No edema; No deformity  ?SKIN: Warm and dry ?NEUROLOGIC:  Alert and oriented x 3 ?PSYCHIATRIC:  Normal affect  ? ? ? ?  ? ?ASSESSMENT:   ? ?1. Paroxysmal atrial fibrillation (HCC)   ? ?PLAN:   ? ?In order of problems listed above: ? ?#Paroxysmal atrial fibrillation ?Highly symptomatic. Episodes are complicated by a very rapid ventricular response. She has severe chest pain, presyncope, shortness of breath during episodes. A rhythm control strategy is indicated. She is currently on eliquis but has a chadsvasc of 1.  ? ?I discussed the options for her during today's appointment. She has failed IC agents. Given her insurance status, dofetilide is not a good option. Given the highly symptomatic nature of her atrial fibrillation episodes, I think the best path forward would be to puruse atrial fibrillation ablation. Her lack of health insurance is going to make this difficult.  ? ?We will reach out to [facility] to inquire about financial assistance.  ? ?She will transition to  aspirin 30m PO daily when she has completed her eliquis.  ? ?I will also transition her to the long acting metoprolol today. ? ?She will follow up in 3 months. She will need amiodarone monitoring lab work at that appointment.  ? ? ?Total time spent with patient today 45 minutes. This includes reviewing records, evaluating the patient and coordinating care.  ? ?Medication Adjustments/Labs and Tests Ordered: ?Current medicines are reviewed at length with the patient today.  Concerns regarding medicines are outlined above.  ?No orders of the defined types were placed in this encounter. ? ?Meds ordered this encounter  ?Medications  ? aspirin EC 81 MG tablet  ?  Sig: Take 1 tablet (  81 mg total) by mouth daily. Swallow whole.  ?  Dispense:  90 tablet  ?  Refill:  3  ? metoprolol succinate (TOPROL-XL) 50 MG 24 hr tablet  ?  Sig: Take 1 tablet (50 mg total) by mouth in the morning and at bedtime. Take with or immediately following a meal.  ?  Dispense:  60 tablet  ?  Refill:  11  ? ? ? ?Signed, ?Lars Mage, MD, [facility], [facility] ?09/26/2021 8:41 PM    ?Electrophysiology ?[place] ?

## 2021-09-26 ENCOUNTER — Ambulatory Visit (INDEPENDENT_AMBULATORY_CARE_PROVIDER_SITE_OTHER): Payer: Self-pay | Admitting: Cardiology

## 2021-09-26 ENCOUNTER — Encounter: Payer: Self-pay | Admitting: Cardiology

## 2021-09-26 VITALS — BP 132/70 | HR 84 | Ht 69.0 in | Wt 275.8 lb

## 2021-09-26 DIAGNOSIS — I480 Paroxysmal atrial fibrillation: Secondary | ICD-10-CM

## 2021-09-26 MED ORDER — METOPROLOL SUCCINATE ER 50 MG PO TB24: 50.0000 mg | ORAL_TABLET | Freq: Two times a day (BID) | ORAL | 11 refills | Status: DC

## 2021-09-26 MED ORDER — ASPIRIN EC 81 MG PO TBEC: 81.0000 mg | DELAYED_RELEASE_TABLET | Freq: Every day | ORAL | 3 refills | Status: DC

## 2021-09-26 NOTE — Patient Instructions (Addendum)
Medication Instructions:  ?Start Aspirin 81 mg by month daily after you Eliquis runs out.  ?Stop Metoprolol tartrate ?Start Metoprolol Succinate 50 mg two times day ? ?Your physician recommends that you continue on your current medications as directed. Please refer to the Current Medication list given to you today. ?*If you need a refill on your cardiac medications before your next appointment, please call your pharmacy* ? ?Lab Work: ?None. ?If you have labs (blood work) drawn today and your tests are completely normal, you will receive your results only by: ?MyChart Message (if you have MyChart) OR ?A paper copy in the mail ?If you have any lab test that is abnormal or we need to change your treatment, we will call you to review the results. ? ?Testing/Procedures: ?None. ? ?Follow-Up: ?At [facility], you and your health needs are our priority.  As part of our continuing mission to provide you with exceptional heart care, we have created designated Provider Care Teams.  These Care Teams include your primary Cardiologist (physician) and Advanced Practice Providers (APPs -  Physician Assistants and Nurse Practitioners) who all work together to provide you with the care you need, when you need it. ? ?Your physician wants you to follow-up in: 3 months with ?Casimiro Needle "Mardelle Matte" [place] Barrett, PA-C ? ?We recommend signing up for the patient portal called "MyChart".  Sign up information is provided on this After Visit Summary.  MyChart is used to connect with patients for Virtual Visits (Telemedicine).  Patients are able to view lab/test results, encounter notes, upcoming appointments, etc.  Non-urgent messages can be sent to your provider as well.   ?To learn more about what you can do with MyChart, go to [email or web address].   ? ?Any Other Special Instructions Will Be Listed Below (If Applicable). ? ? ? ? ?  ? ? ?

## 2021-10-01 ENCOUNTER — Telehealth: Payer: Self-pay | Admitting: Licensed Clinical Social Worker

## 2021-10-01 ENCOUNTER — Encounter: Payer: Self-pay | Admitting: Cardiology

## 2021-10-01 NOTE — Telephone Encounter (Signed)
CSW contacted patient to discuss possible insurance options. No answer and message left for return call. Sydney Beech, LCSW, CCSW-MCS [phone] ? ?

## 2021-10-03 ENCOUNTER — Telehealth: Payer: Self-pay | Admitting: Licensed Clinical Social Worker

## 2021-10-03 NOTE — Telephone Encounter (Signed)
CSW returned call to patient and left message for patient regarding insurance assistance and options. CSW will await return call. Raquel Sarna, [place], [place] ? ?

## 2021-10-08 ENCOUNTER — Telehealth: Payer: Self-pay | Admitting: Licensed Clinical Social Worker

## 2021-10-08 NOTE — Telephone Encounter (Signed)
Patient referred by Dr Lovena Neighbours office to CSW to follow up with possible insurance options as patient needs an ablation. CSW contacted patient who states she has discussed options with financial counselor and informed that she does not meet medicaid eligibility. Patient is gathering the needed documents for the CAFA application and verbalizes understanding of follow up needed to complete application process. Patient will contact Dr Lovena Neighbours office when she has a determination of the CAFA application. CSW available as needed. Lasandra Beech, LCSW, CCSW-MCS [phone] ? ?

## 2022-01-07 NOTE — Progress Notes (Signed)
PCP:  Marinus Maw, MD Primary Cardiologist: None Electrophysiologist: Lanier Prude, MD   [name] is a 56 y.o. female seen today for Lanier Prude, MD for routine electrophysiology followup.  Since last being seen in our clinic the patient reports doing OK. She has had 4-5 breakthrough episodes of AF on amiodarone. Longest lasting 2 hours, and had 2 episodes last week. Her husband got an ICD last month, so they are dealing with that as well. She has still not been able to afford insurance. She does have financial assistance through the hospital at 50% coverage. Her biggest symptom with AF is lightheadedness. Denies near syncope. she denies chest pain, dyspnea, PND, orthopnea, nausea, vomiting, dizziness, syncope, edema, weight gain, or early satiety.  Past Medical History:  Diagnosis Date   Atrial fibrillation with RVR (HCC) 12/26/2016   Sepsis (HCC)    WPW (Wolff-Parkinson-White syndrome) 12/26/2016   Past Surgical History:  Procedure Laterality Date   ABDOMINAL HYSTERECTOMY     KNEE SURGERY     LEFT HEART CATH AND CORONARY ANGIOGRAPHY N/A 08/30/2021   Procedure: LEFT HEART CATH AND CORONARY ANGIOGRAPHY;  Surgeon: Orbie Pyo, MD;  Location: [facility] INVASIVE CV LAB;  Service: Cardiovascular;  Laterality: N/A;   V TACH ABLATION N/A 02/24/2017   Procedure: V Tach Ablation WPW Ablation;  Surgeon: Marinus Maw, MD;  Location: [facility] INVASIVE CV LAB;  Service: Cardiovascular;  Laterality: N/A;    Current Outpatient Medications  Medication Sig Dispense Refill   amiodarone (PACERONE) 200 MG tablet Take 1 tablet (200 mg total) by mouth daily. 30 tablet 5   aspirin EC 81 MG tablet Take 1 tablet (81 mg total) by mouth daily. Swallow whole. 90 tablet 3   metoprolol succinate (TOPROL-XL) 50 MG 24 hr tablet Take 1 tablet (50 mg total) by mouth in the morning and at bedtime. Take with or immediately following a meal. 60 tablet 11   nitroGLYCERIN (NITROSTAT) 0.4 MG SL tablet Take  one tablet by mouth as needed for exertional chest pain discomfort.  May take 1 tablet by mouth daily. (Patient taking differently: Place 0.4 mg under the tongue every 5 (five) minutes as needed for chest pain.) 60 tablet 1   No current facility-administered medications for this visit.    Allergies  Allergen Reactions   Penicillins Anaphylaxis, Shortness Of Breath and Swelling    Has patient had a PCN reaction causing immediate rash, facial/tongue/throat swelling, SOB or lightheadedness with hypotension: Yes Has patient had a PCN reaction causing severe rash involving mucus membranes or skin necrosis: No Has patient had a PCN reaction that required hospitalization: While hospitalized Has patient had a PCN reaction occurring within the last 10 years: No If all of the above answers are "NO", then may proceed with Cephalosporin use.    Bee Venom Swelling and Other (See Comments)    Swelling and hardness at the site of bee sting.    Social History   Socioeconomic History   Marital status: Married    Spouse name: Not on file   Number of children: Not on file   Years of education: Not on file   Highest education level: Not on file  Occupational History   Not on file  Tobacco Use   Smoking status: Never   Smokeless tobacco: Never  Vaping Use   Vaping Use: Never used  Substance and Sexual Activity   Alcohol use: Yes    Comment: occ   Drug use: No  Sexual activity: Not on file  Other Topics Concern   Not on file  Social History Narrative   Not on file   Social Determinants of Health   Financial Resource Strain: High Risk (10/08/2021)   Overall Financial Resource Strain (CARDIA)    Difficulty of Paying Living Expenses: Hard  Food Insecurity: Not on file  Transportation Needs: Not on file  Physical Activity: Not on file  Stress: Not on file  Social Connections: Not on file  Intimate Partner Violence: Not on file     Review of Systems: All other systems reviewed and are  otherwise negative except as noted above.  Physical Exam: There were no vitals filed for this visit.  GEN- The patient is well appearing, alert and oriented x 3 today.   HEENT: normocephalic, atraumatic; sclera clear, conjunctiva pink; hearing intact; oropharynx clear; neck supple, no JVP Lymph- no cervical lymphadenopathy Lungs- Clear to ausculation bilaterally, normal work of breathing.  No wheezes, rales, rhonchi Heart- Regular rate and rhythm, no murmurs, rubs or gallops, PMI not laterally displaced GI- soft, non-tender, non-distended, bowel sounds present, no hepatosplenomegaly Extremities- no clubbing, cyanosis, or edema; DP/PT/radial pulses 2+ bilaterally MS- no significant deformity or atrophy Skin- warm and dry, no rash or lesion Psych- euthymic mood, full affect Neuro- strength and sensation are intact  EKG is ordered. Personal review of EKG from today shows sinus bradycardia at 50 bpm  Additional studies reviewed include: Previous EP office notes.   Assessment and Plan:  1. Paroxysmal atrial fibrillation Has failed 1C agents Continue amiodarone 200 mg daily. Surveillance labs today.  Continue ASA alone given CHA2DS2/VASc only 1, for female.   Ideally would plan ablation, but currently without insurance.    Follow up with Dr. Lalla Brothers in 6 months   Graciella Freer, PA-C  01/07/22 10:30 AM

## 2022-01-14 ENCOUNTER — Ambulatory Visit (INDEPENDENT_AMBULATORY_CARE_PROVIDER_SITE_OTHER): Payer: Self-pay | Admitting: Student

## 2022-01-14 ENCOUNTER — Encounter: Payer: Self-pay | Admitting: Student

## 2022-01-14 VITALS — BP 136/80 | HR 52 | Ht 69.0 in | Wt 281.0 lb

## 2022-01-14 DIAGNOSIS — I480 Paroxysmal atrial fibrillation: Secondary | ICD-10-CM

## 2022-01-14 LAB — COMPREHENSIVE METABOLIC PANEL
ALT: 20 IU/L (ref 0–32)
AST: 23 IU/L (ref 0–40)
Albumin/Globulin Ratio: 1.5 (ref 1.2–2.2)
Albumin: 4.4 g/dL (ref 3.8–4.9)
Alkaline Phosphatase: 57 IU/L (ref 44–121)
BUN/Creatinine Ratio: 25 — ABNORMAL HIGH (ref 9–23)
BUN: 19 mg/dL (ref 6–24)
Bilirubin Total: 0.2 mg/dL (ref 0.0–1.2)
CO2: 26 mmol/L (ref 20–29)
Calcium: 9.9 mg/dL (ref 8.7–10.2)
Chloride: 104 mmol/L (ref 96–106)
Creatinine, Ser: 0.76 mg/dL (ref 0.57–1.00)
Globulin, Total: 3 g/dL (ref 1.5–4.5)
Glucose: 109 mg/dL — ABNORMAL HIGH (ref 70–99)
Potassium: 3.8 mmol/L (ref 3.5–5.2)
Sodium: 143 mmol/L (ref 134–144)
Total Protein: 7.4 g/dL (ref 6.0–8.5)
eGFR: 91 mL/min/{1.73_m2} (ref >59)

## 2022-01-14 LAB — T4, FREE: Free T4: 1.09 ng/dL (ref 0.82–1.77)

## 2022-01-14 LAB — TSH: TSH: 4.2 u[IU]/mL (ref 0.450–4.500)

## 2022-01-14 NOTE — Patient Instructions (Signed)
Medication Instructions:  Your physician recommends that you continue on your current medications as directed. Please refer to the Current Medication list given to you today.  *If you need a refill on your cardiac medications before your next appointment, please call your pharmacy*   Lab Work: TODAY: CMET, TSH, FreeT4  If you have labs (blood work) drawn today and your tests are completely normal, you will receive your results only by: MyChart Message (if you have MyChart) OR A paper copy in the mail If you have any lab test that is abnormal or we need to change your treatment, we will call you to review the results.   Follow-Up: At [facility], you and your health needs are our priority.  As part of our continuing mission to provide you with exceptional heart care, we have created designated Provider Care Teams.  These Care Teams include your primary Cardiologist (physician) and Advanced Practice Providers (APPs -  Physician Assistants and Nurse Practitioners) who all work together to provide you with the care you need, when you need it.  Your next appointment:   6 month(s)  The format for your next appointment:   In Person  Provider:   Steffanie Dunn, MD

## 2022-01-22 ENCOUNTER — Encounter: Payer: Self-pay | Admitting: Cardiology

## 2022-03-01 ENCOUNTER — Other Ambulatory Visit: Payer: Self-pay | Admitting: Physician Assistant

## 2022-05-26 ENCOUNTER — Other Ambulatory Visit: Payer: Self-pay | Admitting: Cardiology

## 2022-05-29 MED ORDER — METOPROLOL SUCCINATE ER 50 MG PO TB24: 50.0000 mg | ORAL_TABLET | Freq: Two times a day (BID) | ORAL | 7 refills | Status: DC

## 2022-08-12 NOTE — Progress Notes (Unsigned)
Electrophysiology Office Follow up Visit Note:    Date:  08/12/2022   ID:  [name], DOB March 27, 1966, MRN [id number]  PCP:  Evans Lance, MD  [facility] Cardiologist:  None  [facility] Electrophysiologist:  Vickie Epley, MD    Interval History:    [name] is a 57 y.o. female who presents for a follow up visit. They were last seen in clinic 09/26/2021 for symptomatic AF. Previously failed flecainide. Was on amiodarone as a bridge to ablation. She did not have health insurance at our last appointment making pursuing ablation a difficult option.         Past Medical History:  Diagnosis Date   Atrial fibrillation with RVR ([place]) 12/26/2016   Sepsis ([place])    WPW (Wolff-Parkinson-White syndrome) 12/26/2016    Past Surgical History:  Procedure Laterality Date   ABDOMINAL HYSTERECTOMY     KNEE SURGERY     LEFT HEART CATH AND CORONARY ANGIOGRAPHY N/A 08/30/2021   Procedure: LEFT HEART CATH AND CORONARY ANGIOGRAPHY;  Surgeon: Early Osmond, MD;  Location: [place] [facility];  Service: Cardiovascular;  Laterality: N/A;   V TACH ABLATION N/A 02/24/2017   Procedure: V Tach Ablation WPW Ablation;  Surgeon: Evans Lance, MD;  Location: [place] [facility];  Service: Cardiovascular;  Laterality: N/A;    Current Medications: No outpatient medications have been marked as taking for the 08/13/22 encounter (Office Visit) with Vickie Epley, MD.     Allergies:   Penicillins and Bee venom   Social History   Socioeconomic History   Marital status: Married    Spouse name: Not on file   Number of children: Not on file   Years of education: Not on file   Highest education level: Not on file  Occupational History   Not on file  Tobacco Use   Smoking status: Never   Smokeless tobacco: Never  Vaping Use   Vaping Use: Never used  Substance and Sexual Activity   Alcohol use: Yes    Comment: occ   Drug use: No   Sexual activity: Not on file   Other Topics Concern   Not on file  Social History Narrative   Not on file   Social Determinants of Health   Financial Resource Strain: High Risk (10/08/2021)   Overall Financial Resource Strain (CARDIA)    Difficulty of Paying Living Expenses: Hard  Food Insecurity: Not on file  Transportation Needs: Not on file  Physical Activity: Not on file  Stress: Not on file  Social Connections: Not on file     Family History: The patient's family history includes Diabetes Mellitus II in her mother; Heart attack in her father; Heart failure in her mother; Stroke in her father.  ROS:   Please see the history of present illness.    All other systems reviewed and are negative.  EKGs/Labs/Other Studies Reviewed:    The following studies were reviewed today:    Recent Labs: 08/31/2021: Hemoglobin 12.5; Platelets 228 01/14/2022: ALT 20; BUN 19; Creatinine, Ser 0.76; Potassium 3.8; Sodium 143; TSH 4.200  Recent Lipid Panel    Component Value Date/Time   CHOL 158 12/25/2016 0445   TRIG 158 (H) 12/25/2016 0445   HDL 33 (L) 12/25/2016 0445   CHOLHDL 4.8 12/25/2016 0445   VLDL 32 12/25/2016 0445   LDLCALC 93 12/25/2016 0445    Physical Exam:    VS:  There were no vitals taken for this visit.  Wt Readings from Last 3 Encounters:  01/14/22 281 lb (127.5 kg)  09/26/21 275 lb 12.8 oz (125.1 kg)  08/31/21 272 lb 11.3 oz (123.7 kg)     GEN: *** Well nourished, well developed in no acute distress CARDIAC: ***RRR, no murmurs, rubs, gallops        ASSESSMENT:    1. Persistent atrial fibrillation ([place])   2. WPW (Wolff-Parkinson-White syndrome)    PLAN:    In order of problems listed above:  #Persistent AF Maintaining sinus on amiodarone 252m PO daily Not on O[place] a low chadsvasc.  CHA2DS2-VASc Score = 1  The patient's score is based upon: CHF History: 0 HTN History: 0 Diabetes History: 0 Stroke History: 0 Vascular Disease History: 0 Age Score: 0 Gender Score:  1  Last liver/thyroid labs OK 12/2021.   Follow up 1 year with APP.      Medication Adjustments/Labs and Tests Ordered: Current medicines are reviewed at length with the patient today.  Concerns regarding medicines are outlined above.  No orders of the defined types were placed in this encounter.  No orders of the defined types were placed in this encounter.    Signed, CLars Mage MD, [facility]2/06/2023 1:14 PM    Electrophysiology [facility]

## 2022-08-13 ENCOUNTER — Encounter: Payer: Self-pay | Admitting: Cardiology

## 2022-08-13 ENCOUNTER — Ambulatory Visit: Payer: Self-pay | Attending: Cardiology | Admitting: Cardiology

## 2022-08-13 VITALS — BP 138/92 | HR 70 | Ht 69.0 in | Wt 279.0 lb

## 2022-08-13 DIAGNOSIS — I4819 Other persistent atrial fibrillation: Secondary | ICD-10-CM

## 2022-08-13 DIAGNOSIS — I456 Pre-excitation syndrome: Secondary | ICD-10-CM

## 2022-08-13 LAB — COMPREHENSIVE METABOLIC PANEL
ALT: 20 IU/L (ref 0–32)
AST: 23 IU/L (ref 0–40)
Albumin/Globulin Ratio: 1.5 (ref 1.2–2.2)
Albumin: 4.4 g/dL (ref 3.8–4.9)
Alkaline Phosphatase: 62 IU/L (ref 44–121)
BUN/Creatinine Ratio: 20 (ref 9–23)
BUN: 16 mg/dL (ref 6–24)
Bilirubin Total: 0.3 mg/dL (ref 0.0–1.2)
CO2: 25 mmol/L (ref 20–29)
Calcium: 9.6 mg/dL (ref 8.7–10.2)
Chloride: 102 mmol/L (ref 96–106)
Creatinine, Ser: 0.79 mg/dL (ref 0.57–1.00)
Globulin, Total: 3 g/dL (ref 1.5–4.5)
Glucose: 109 mg/dL — ABNORMAL HIGH (ref 70–99)
Potassium: 4 mmol/L (ref 3.5–5.2)
Sodium: 140 mmol/L (ref 134–144)
Total Protein: 7.4 g/dL (ref 6.0–8.5)
eGFR: 87 mL/min/{1.73_m2} (ref >59)

## 2022-08-13 LAB — TSH: TSH: 3.18 u[IU]/mL (ref 0.450–4.500)

## 2022-08-13 LAB — T4, FREE: Free T4: 1.16 ng/dL (ref 0.82–1.77)

## 2022-08-13 MED ORDER — METOPROLOL SUCCINATE ER 50 MG PO TB24: 50.0000 mg | ORAL_TABLET | Freq: Two times a day (BID) | ORAL | 7 refills | Status: DC

## 2022-08-13 MED ORDER — AMIODARONE HCL 200 MG PO TABS: 200.0000 mg | ORAL_TABLET | Freq: Every day | ORAL | 3 refills | Status: DC

## 2022-08-13 NOTE — Patient Instructions (Signed)
Medication Instructions:  Your physician recommends that you continue on your current medications as directed. Please refer to the Current Medication list given to you today.  *If you need a refill on your cardiac medications before your next appointment, please call your pharmacy*  Lab Work: TODAY: CMET, TSH, T4 If you have labs (blood work) drawn today and your tests are completely normal, you will receive your results only by: [place] (if you have MyChart) OR A paper copy in the mail If you have any lab test that is abnormal or we need to change your treatment, we will call you to review the results.  Follow-Up: At [facility], you and your health needs are our priority.  As part of our continuing mission to provide you with exceptional heart care, we have created designated Provider Care Teams.  These Care Teams include your primary Cardiologist (physician) and Advanced Practice Providers (APPs -  Physician Assistants and Nurse Practitioners) who all work together to provide you with the care you need, when you need it.  Your next appointment:   6 month(s)  Provider:   You will see one of the following Advanced Practice Providers on your designated Care Team:   Tommye Standard, [place]" [place], [place], NP

## 2022-08-13 NOTE — Progress Notes (Signed)
Electrophysiology Office Follow up Visit Note:    Date:  08/13/2022   ID:  [name], DOB [birth year], MRN [id number]  PCP:  Evans Lance, MD  [facility] Cardiologist:  None  [facility] Electrophysiologist:  Vickie Epley, MD    Interval History:    [name] is a 57 y.o. female who presents for a follow up visit. They were last seen in clinic 09/26/2021 for symptomatic AF. Previously failed flecainide. Was on amiodarone as a bridge to ablation. She did not have health insurance at our last appointment making pursuing ablation a difficult option.  Today, she is overall feeling well.   She continues having episodes of A-fib about every other week. Most times her episodes last a couple of hours, but notes that her longest episode lasted from Friday to Sunday. She notices that she has been needing to use her Nitro more often to relieve symptoms when in A-fib.   She is compliant with metoprolol and aspirin.   She denies any palpitations, chest pain, shortness of breath, or peripheral edema. No lightheadedness, headaches, syncope, orthopnea, or PND.  Past Medical History:  Diagnosis Date   Atrial fibrillation with RVR ([place]) 12/26/2016   Sepsis ([place])    WPW (Wolff-Parkinson-White syndrome) 12/26/2016    Past Surgical History:  Procedure Laterality Date   ABDOMINAL HYSTERECTOMY     KNEE SURGERY     LEFT HEART CATH AND CORONARY ANGIOGRAPHY N/A 08/30/2021   Procedure: LEFT HEART CATH AND CORONARY ANGIOGRAPHY;  Surgeon: Early Osmond, MD;  Location: [place] [facility];  Service: Cardiovascular;  Laterality: N/A;   V TACH ABLATION N/A 02/24/2017   Procedure: V Tach Ablation WPW Ablation;  Surgeon: Evans Lance, MD;  Location: [place] [facility];  Service: Cardiovascular;  Laterality: N/A;    Current Medications: Current Meds  Medication Sig   aspirin EC 81 MG tablet Take 1 tablet (81 mg total) by mouth daily. Swallow whole.   nitroGLYCERIN (NITROSTAT)  0.4 MG SL tablet Take one tablet by mouth as needed for exertional chest pain discomfort.  May take 1 tablet by mouth daily. (Patient taking differently: Place 0.4 mg under the tongue every 5 (five) minutes as needed for chest pain.)   [DISCONTINUED] amiodarone (PACERONE) 200 MG tablet TAKE 1 TABLET BY MOUTH EVERY DAY   [DISCONTINUED] metoprolol succinate (TOPROL-XL) 50 MG 24 hr tablet Take 1 tablet (50 mg total) by mouth in the morning and at bedtime. Take with or immediately following a meal.     Allergies:   Penicillins and Bee venom   Social History   Socioeconomic History   Marital status: Married    Spouse name: Not on file   Number of children: Not on file   Years of education: Not on file   Highest education level: Not on file  Occupational History   Not on file  Tobacco Use   Smoking status: Never   Smokeless tobacco: Never  Vaping Use   Vaping Use: Never used  Substance and Sexual Activity   Alcohol use: Yes    Comment: occ   Drug use: No   Sexual activity: Not on file  Other Topics Concern   Not on file  Social History Narrative   Not on file   Social Determinants of Health   Financial Resource Strain: High Risk (10/08/2021)   Overall Financial Resource Strain (CARDIA)    Difficulty of Paying Living Expenses: Hard  Food Insecurity: Not on file  Transportation  Needs: Not on file  Physical Activity: Not on file  Stress: Not on file  Social Connections: Not on file     Family History: The patient's family history includes Diabetes Mellitus II in her mother; Heart attack in her father; Heart failure in her mother; Stroke in her father.  ROS:   Please see the history of present illness.    All other systems reviewed and are negative.  EKGs/Labs/Other Studies Reviewed:    The following studies were reviewed today:    Recent Labs: 08/31/2021: Hemoglobin 12.5; Platelets 228 01/14/2022: ALT 20; BUN 19; Creatinine, Ser 0.76; Potassium 3.8; Sodium 143; TSH 4.200   Recent Lipid Panel    Component Value Date/Time   CHOL 158 12/25/2016 0445   TRIG 158 (H) 12/25/2016 0445   HDL 33 (L) 12/25/2016 0445   CHOLHDL 4.8 12/25/2016 0445   VLDL 32 12/25/2016 0445   LDLCALC 93 12/25/2016 0445    Physical Exam:    VS:  BP (!) 138/92   Pulse 70   Ht 5' 9"$  (1.753 m)   Wt 279 lb (126.6 kg)   SpO2 97%   BMI 41.20 kg/m     Wt Readings from Last 3 Encounters:  08/13/22 279 lb (126.6 kg)  01/14/22 281 lb (127.5 kg)  09/26/21 275 lb 12.8 oz (125.1 kg)     GEN:  Well nourished, well developed in no acute distress CARDIAC: RRR, no murmurs, rubs, gallops   ASSESSMENT:    1. Persistent atrial fibrillation ([place])   2. WPW (Wolff-Parkinson-White syndrome)    PLAN:    In order of problems listed above:  #Persistent AF Maintaining sinus on amiodarone 282m PO daily Not on [place] a low chadsvasc.  CHA2DS2-VASc Score = 1  The patient's score is based upon: CHF History: 0 HTN History: 0 Diabetes History: 0 Stroke History: 0 Vascular Disease History: 0 Age Score: 0 Gender Score: 1  Last liver/thyroid labs OK 12/2021.   Recheck CMP, TSH and free T4 today.  Follow-up with APP in 6 months.  Repeat blood work at that time for amiodarone monitoring.   Medication Adjustments/Labs and Tests Ordered: Current medicines are reviewed at length with the patient today.  Concerns regarding medicines are outlined above.  Orders Placed This Encounter  Procedures   Comprehensive metabolic panel   TSH   T4, free   Meds ordered this encounter  Medications   amiodarone (PACERONE) 200 MG tablet    Sig: Take 1 tablet (200 mg total) by mouth daily.    Dispense:  90 tablet    Refill:  3   metoprolol succinate (TOPROL-XL) 50 MG 24 hr tablet    Sig: Take 1 tablet (50 mg total) by mouth in the morning and at bedtime. Take with or immediately following a meal.    Dispense:  90 tablet    Refill:  7    I,Rachel Rivera,acting as a scribe for CVickie Epley MD.,have documented all relevant documentation on the behalf of CVickie Epley MD,as directed by  CVickie Epley MD while in the presence of CVickie Epley MD.  I, CVickie Epley MD, have reviewed all documentation for this visit. The documentation on 08/13/22 for the exam, diagnosis, procedures, and orders are all accurate and complete.   Signed, CLars Mage MD, [facility]2/13/2024 8:24 AM    Electrophysiology [facility]

## 2022-08-22 ENCOUNTER — Telehealth: Payer: Self-pay | Admitting: Cardiology

## 2022-08-22 MED ORDER — NITROGLYCERIN 0.4 MG SL SUBL: SUBLINGUAL_TABLET | SUBLINGUAL | 2 refills | Status: DC

## 2022-08-22 NOTE — Telephone Encounter (Signed)
Pt's medication was sent to pt's pharmacy as requested. Confirmation received.  °

## 2022-08-22 NOTE — Telephone Encounter (Signed)
*  STAT* If patient is at the pharmacy, call can be transferred to refill team.   1. Which medications need to be refilled? (please list name of each medication and dose if known) nitroGLYCERIN (NITROSTAT) 0.4 MG SL tablet  2. Which pharmacy/location (including street and city if local pharmacy) is medication to be sent to? [id number] [place], [place] - [address]  3. Do they need a 30 day or 90 day supply?  90 day supply

## 2022-09-12 ENCOUNTER — Observation Stay (HOSPITAL_BASED_OUTPATIENT_CLINIC_OR_DEPARTMENT_OTHER)
Admission: EM | Admit: 2022-09-12 | Discharge: 2022-09-13 | Disposition: A | Discharge status: Home / Self Care | Payer: Self-pay | Attending: Internal Medicine | Admitting: Internal Medicine

## 2022-09-12 ENCOUNTER — Encounter (HOSPITAL_BASED_OUTPATIENT_CLINIC_OR_DEPARTMENT_OTHER): Payer: Self-pay

## 2022-09-12 DIAGNOSIS — R739 Hyperglycemia, unspecified: Secondary | ICD-10-CM

## 2022-09-12 DIAGNOSIS — R778 Other specified abnormalities of plasma proteins: Secondary | ICD-10-CM | POA: Insufficient documentation

## 2022-09-12 DIAGNOSIS — E663 Overweight: Secondary | ICD-10-CM | POA: Insufficient documentation

## 2022-09-12 DIAGNOSIS — R079 Chest pain, unspecified: Secondary | ICD-10-CM | POA: Diagnosis present

## 2022-09-12 DIAGNOSIS — I456 Pre-excitation syndrome: Secondary | ICD-10-CM | POA: Diagnosis present

## 2022-09-12 DIAGNOSIS — I4891 Unspecified atrial fibrillation: Secondary | ICD-10-CM | POA: Diagnosis present

## 2022-09-12 DIAGNOSIS — Z7982 Long term (current) use of aspirin: Secondary | ICD-10-CM | POA: Insufficient documentation

## 2022-09-12 DIAGNOSIS — I471 Supraventricular tachycardia, unspecified: Principal | ICD-10-CM | POA: Diagnosis present

## 2022-09-12 DIAGNOSIS — Z6841 Body Mass Index (BMI) 40.0 and over, adult: Secondary | ICD-10-CM | POA: Insufficient documentation

## 2022-09-12 DIAGNOSIS — R7989 Other specified abnormal findings of blood chemistry: Secondary | ICD-10-CM | POA: Diagnosis present

## 2022-09-12 LAB — CBC
HCT: 42 % (ref 36.0–46.0)
Hemoglobin: 14.2 g/dL (ref 12.0–15.0)
MCH: 29.4 pg (ref 26.0–34.0)
MCHC: 33.8 g/dL (ref 30.0–36.0)
MCV: 87 fL (ref 80.0–100.0)
Platelets: 309 10*3/uL (ref 150–400)
RBC: 4.83 MIL/uL (ref 3.87–5.11)
RDW: 13.4 % (ref 11.5–15.5)
WBC: 10.3 10*3/uL (ref 4.0–10.5)
nRBC: 0 % (ref 0.0–0.2)

## 2022-09-12 LAB — BASIC METABOLIC PANEL
Anion gap: 13 (ref 5–15)
BUN: 19 mg/dL (ref 6–20)
CO2: 24 mmol/L (ref 22–32)
Calcium: 9.7 mg/dL (ref 8.9–10.3)
Chloride: 102 mmol/L (ref 98–111)
Creatinine, Ser: 0.88 mg/dL (ref 0.44–1.00)
GFR, Estimated: >60 mL/min (ref >60)
Glucose, Bld: 206 mg/dL — ABNORMAL HIGH (ref 70–99)
Potassium: 3.7 mmol/L (ref 3.5–5.1)
Sodium: 139 mmol/L (ref 135–145)

## 2022-09-12 LAB — TROPONIN I (HIGH SENSITIVITY)
Troponin I (High Sensitivity): 4 ng/L (ref <18)
Troponin I (High Sensitivity): 143 ng/L (ref <18)

## 2022-09-12 LAB — T4, FREE: Free T4: 0.91 ng/dL (ref 0.61–1.12)

## 2022-09-12 LAB — MAGNESIUM: Magnesium: 1.9 mg/dL (ref 1.7–2.4)

## 2022-09-12 LAB — TSH: TSH: 3.97 u[IU]/mL (ref 0.350–4.500)

## 2022-09-12 LAB — GLUCOSE, CAPILLARY: Glucose-Capillary: 169 mg/dL — ABNORMAL HIGH (ref 70–99)

## 2022-09-12 LAB — HIV ANTIBODY (ROUTINE TESTING W REFLEX): HIV Screen 4th Generation wRfx: NONREACTIVE

## 2022-09-12 LAB — PREGNANCY, URINE: Preg Test, Ur: NEGATIVE

## 2022-09-12 MED ORDER — ACETAMINOPHEN 650 MG RE SUPP: 650.0000 mg | Freq: Four times a day (QID) | RECTAL | Status: DC | PRN

## 2022-09-12 MED ORDER — INSULIN ASPART 100 UNIT/ML IJ SOLN: 0.0000 [IU] | Freq: Every day | INTRAMUSCULAR | Status: DC

## 2022-09-12 MED ORDER — ETOMIDATE 2 MG/ML IV SOLN
INTRAVENOUS | Status: AC
  Administered 2022-09-12: 10 mg via INTRAVENOUS
  Filled 2022-09-12: qty 10

## 2022-09-12 MED ORDER — ENOXAPARIN SODIUM 40 MG/0.4ML IJ SOSY
40.0000 mg | PREFILLED_SYRINGE | INTRAMUSCULAR | Status: DC
  Administered 2022-09-12: 40 mg via SUBCUTANEOUS
  Filled 2022-09-12: qty 0.4

## 2022-09-12 MED ORDER — INSULIN ASPART 100 UNIT/ML IJ SOLN: 0.0000 [IU] | Freq: Three times a day (TID) | INTRAMUSCULAR | Status: DC

## 2022-09-12 MED ORDER — ETOMIDATE 2 MG/ML IV SOLN: 10.0000 mg | Freq: Once | INTRAVENOUS | Status: AC

## 2022-09-12 MED ORDER — AMIODARONE HCL 200 MG PO TABS
200.0000 mg | ORAL_TABLET | Freq: Two times a day (BID) | ORAL | Status: DC
  Administered 2022-09-12 – 2022-09-13 (×2): 200 mg via ORAL
  Filled 2022-09-12 (×2): qty 1

## 2022-09-12 MED ORDER — ONDANSETRON HCL 4 MG/2ML IJ SOLN
INTRAMUSCULAR | Status: AC
  Administered 2022-09-12: 4 mg via INTRAVENOUS
  Filled 2022-09-12: qty 2

## 2022-09-12 MED ORDER — PHENYLEPHRINE 80 MCG/ML (10ML) SYRINGE FOR IV PUSH (FOR BLOOD PRESSURE SUPPORT)
PREFILLED_SYRINGE | INTRAVENOUS | Status: AC
  Filled 2022-09-12: qty 10

## 2022-09-12 MED ORDER — ACETAMINOPHEN 325 MG PO TABS
650.0000 mg | ORAL_TABLET | Freq: Four times a day (QID) | ORAL | Status: DC | PRN
  Filled 2022-09-12: qty 2

## 2022-09-12 MED ORDER — METOPROLOL SUCCINATE ER 50 MG PO TB24
50.0000 mg | ORAL_TABLET | Freq: Every day | ORAL | Status: DC
  Administered 2022-09-13: 50 mg via ORAL
  Filled 2022-09-12: qty 1

## 2022-09-12 MED ORDER — ONDANSETRON HCL 4 MG/2ML IJ SOLN: 4.0000 mg | Freq: Once | INTRAMUSCULAR | Status: AC

## 2022-09-12 NOTE — Sedation Documentation (Signed)
150J shock delivered

## 2022-09-12 NOTE — ED Notes (Addendum)
Called [facility] - They will page [facility] prior to calling [facility].  Time 10:51am

## 2022-09-12 NOTE — Progress Notes (Addendum)
2130 Troponin result is 143. Dr Marlowe Sax has put in trop order for 2300. Patient denies chest pain, walking in the room-bathroom ok. Alert 4x. No complaints. Plan of care ongoing.   Latest Reference Range & Units 09/12/22 21:19 09/12/22 23:37  Troponin I (High Sensitivity) <18 ng/L 143 (HH) 131 (HH)  (HH): Data is critically high  Dr [place] Crane was made aware.

## 2022-09-12 NOTE — Consult Note (Signed)
Cardiology Consultation   Patient ID: [name] MRN: [id number]; DOB: 01-Oct-1965  Admit date: 09/12/2022 Date of Consult: 09/12/2022  PCP:  Evans Lance, MD   [facility] Prov[facility] Cardiologist:  None  Electrophysiologist:  Vickie Epley, MD       Patient Profile:   [name] is a 57 y.o. female with a hx of WPW who is being seen 09/12/2022 for the evaluation of chest tightness post cardioversion with mildly elevated troponin At the request of Dr. Rosita Fire.  History of Present Illness:   [name] is a 57 year old female patient of Dr. Mardene Speak with electrophysiology seen back in clinic on 08/13/2022 after previously failing flecainide and had been on amiodarone 200 mg once a day as a bridge to ablation.  She did not have health insurance at that time and it was making pursuing ablation a difficult effort.  During that clinic visit she was feeling well.  She continues to have some episodes of atrial fibrillation maybe every other week sometimes lasting for couple hours duration.  She ended up coming into the emergency department on 09/12/2022 with another episode of atrial arrhythmia that required DC cardioversion given hypotension.  She is not on anticoagulation given her low CHA2DS2-VASc score of 1 for female.  Thyroid and other metabolic profile are normal.  She also takes metoprolol 50 mg a day.  Multiple EKGs reviewed demonstrating the tachycardia episode and post conversion episode.  There was concern because there was accentuation of ST segment depression with aVR elevation following the cardioversion.Please see EKG tracings for full details.  Troponin went from 4 up to 57.     Past Medical History:  Diagnosis Date   Atrial fibrillation with rapid ventricular response ([place]) 12/24/2016   Atrial fibrillation with RVR ([place]) 12/26/2016   Chest pain 12/24/2016   Elevated troponin 12/25/2016   Hypokalemia 12/24/2016   Leukocytosis 12/24/2016    Sepsis ([place])    WPW (Wolff-Parkinson-White syndrome) 12/26/2016    Past Surgical History:  Procedure Laterality Date   ABDOMINAL HYSTERECTOMY     KNEE SURGERY     LEFT HEART CATH AND CORONARY ANGIOGRAPHY N/A 08/30/2021   Procedure: LEFT HEART CATH AND CORONARY ANGIOGRAPHY;  Surgeon: Early Osmond, MD;  Location: [facility];  Service: Cardiovascular;  Laterality: N/A;   V TACH ABLATION N/A 02/24/2017   Procedure: V Tach Ablation WPW Ablation;  Surgeon: Evans Lance, MD;  Location: [facility];  Service: Cardiovascular;  Laterality: N/A;     Home Medications:  Prior to Admission medications   Medication Sig Start Date End Date Taking? Authorizing Provider  amiodarone (PACERONE) 200 MG tablet Take 1 tablet (200 mg total) by mouth daily. 08/13/22   Vickie Epley, MD  aspirin EC 81 MG tablet Take 1 tablet (81 mg total) by mouth daily. Swallow whole. 09/26/21   Vickie Epley, MD  metoprolol succinate (TOPROL-XL) 50 MG 24 hr tablet Take 1 tablet (50 mg total) by mouth in the morning and at bedtime. Take with or immediately following a meal. 08/13/22   Vickie Epley, MD  nitroGLYCERIN (NITROSTAT) 0.4 MG SL tablet Take one tablet by mouth as needed for exertional chest pain discomfort.  May take 1 tablet by mouth daily. 08/22/22   Vickie Epley, MD    Inpatient Medications: Scheduled Meds:  Continuous Infusions:  PRN Meds:   Allergies:    Allergies  Allergen Reactions   Penicillins Anaphylaxis, Shortness Of Breath and  Swelling    Has patient had a PCN reaction causing immediate rash, facial/tongue/throat swelling, SOB or lightheadedness with hypotension: Yes Has patient had a PCN reaction causing severe rash involving mucus membranes or skin necrosis: No Has patient had a PCN reaction that required hospitalization: While hospitalized Has patient had a PCN reaction occurring within the last 10 years: No If all of the above answers are "NO", then may  proceed with Cephalosporin use.    Bee Venom Swelling and Other (See Comments)    Swelling and hardness at the site of bee sting.    Social History:   Social History   Socioeconomic History   Marital status: Married    Spouse name: Not on file   Number of children: Not on file   Years of education: Not on file   Highest education level: Not on file  Occupational History   Not on file  Tobacco Use   Smoking status: Never   Smokeless tobacco: Never  Vaping Use   Vaping Use: Never used  Substance and Sexual Activity   Alcohol use: Yes    Comment: occ   Drug use: No   Sexual activity: Not on file  Other Topics Concern   Not on file  Social History Narrative   Not on file   Social Determinants of Health   Financial Resource Strain: High Risk (10/08/2021)   Overall Financial Resource Strain (CARDIA)    Difficulty of Paying Living Expenses: Hard  Food Insecurity: Not on file  Transportation Needs: Not on file  Physical Activity: Not on file  Stress: Not on file  Social Connections: Not on file  Intimate Partner Violence: Not on file    Family History:    Family History  Problem Relation Age of Onset   Diabetes Mellitus II Mother    Heart failure Mother    Heart attack Father    Stroke Father      ROS:  Please see the history of present illness.   All other ROS reviewed and negative.     Physical Exam/Data:   Vitals:   09/12/22 1715 09/12/22 1827 09/12/22 1844 09/12/22 1900  BP: (!) 150/77 138/72    Pulse: 63 60 67 64  Resp: 14 15 (!) 22 17  Temp: 98.4 F (36.9 C) 98.3 F (36.8 C)    TempSrc:  Oral    SpO2: 97% 94% 96% 97%  Weight:   125.8 kg   Height:  '5\' 9"'$  (1.753 m)     No intake or output data in the 24 hours ending 09/12/22 1958    09/12/2022    6:44 PM 09/12/2022   10:03 AM 08/13/2022    7:59 AM  Last 3 Weights  Weight (lbs) 277 lb 5.4 oz 283 lb 1.6 oz 279 lb  Weight (kg) 125.8 kg 128.413 kg 126.554 kg     Body mass index is 40.96 kg/m.   General:  Well nourished, well developed, in no acute distress HEENT: normal Neck: no JVD Vascular: No carotid bruits; Distal pulses 2+ bilaterally Cardiac:  normal S1, S2; RRR; no murmur  Lungs:  clear to auscultation bilaterally, no wheezing, rhonchi or rales  Abd: soft, nontender, no hepatomegaly  Ext: no edema Musculoskeletal:  No deformities, BUE and BLE strength normal and equal Skin: warm and dry  Neuro:  CNs 2-12 intact, no focal abnormalities noted Psych:  Normal affect   EKG: As described above Telemetry:  Telemetry was personally reviewed and demonstrates: Currently sinus rhythm  Relevant CV Studies: Cardiac Studies & Procedures   CARDIAC CATHETERIZATION  CARDIAC CATHETERIZATION 08/30/2021  Narrative   LV end diastolic pressure is mildly elevated.   The left ventricular ejection fraction is 55-65% by visual estimate.  1.  Normal right dominant coronary circulation. 2.  LVEDP of 20 mmHg with preserved ejection fraction.  The results were reviewed with Dr. Quentin Ore.  Recommendations: Medical therapy.  Findings Coronary Findings Diagnostic  Dominance: Right  No diagnostic findings have been documented. Intervention  No interventions have been documented.   STRESS TESTS  MYOCARDIAL PERFUSION IMAGING 01/22/2017  Narrative  Nuclear stress EF: 53%.  There was no ST segment deviation noted during stress.  There is a large defect of moderate severity present in the basal anteroseptal, mid anterior, mid anteroseptal, apical anterior and apical septal location. The defect is non-reversible. This is consistent with breast attenuation artifact. No ischemia noted.  This is a low risk study.  The left ventricular ejection fraction is mildly decreased (45-54%).   ECHOCARDIOGRAM  ECHOCARDIOGRAM COMPLETE 08/30/2021  Narrative ECHOCARDIOGRAM REPORT    Patient Name:   DORANN VASSELL Date of Exam: 08/30/2021 Medical Rec #:  [id number]          Height:       69.0  in Accession #:    [id number]         Weight:       265.0 lb Date of Birth:  09-25-1965          BSA:          2.328 m Patient Age:    52 years           BP:           109/57 mmHg Patient Gender: F                  HR:           117 bpm. Exam Location:  Inpatient  Procedure: 2D Echo, Cardiac Doppler and Color Doppler  Indications:    Atrial Fibrillation  History:        Patient has no prior history of Echocardiogram examinations.  Sonographer:    Beryle Beams Referring Phys: [id number] RENEE LYNN Durant   1. Left ventricular ejection fraction, by estimation, is 50 to 55%. Left ventricular ejection fraction by 2D MOD biplane is 50.8 %. The left ventricle has low normal function. The left ventricle has no regional wall motion abnormalities. There is mild left ventricular hypertrophy. Left ventricular diastolic function could not be evaluated. 2. Right ventricular systolic function is normal. The right ventricular size is normal. Tricuspid regurgitation signal is inadequate for assessing PA pressure. 3. Left atrial size was mildly dilated. 4. The mitral valve is grossly normal. Trivial mitral valve regurgitation. 5. The aortic valve is tricuspid. Aortic valve regurgitation is trivial. Aortic valve sclerosis is present, with no evidence of aortic valve stenosis.  Comparison(s): No prior Echocardiogram.  FINDINGS Left Ventricle: Left ventricular ejection fraction, by estimation, is 50 to 55%. Left ventricular ejection fraction by 2D MOD biplane is 50.8 %. The left ventricle has low normal function. The left ventricle has no regional wall motion abnormalities. The left ventricular internal cavity size was normal in size. There is mild left ventricular hypertrophy. Left ventricular diastolic function could not be evaluated due to atrial fibrillation. Left ventricular diastolic function could not be evaluated.  Right Ventricle: The right ventricular size is normal. No increase in  right ventricular wall thickness. Right  ventricular systolic function is normal. Tricuspid regurgitation signal is inadequate for assessing PA pressure.  Left Atrium: Left atrial size was mildly dilated.  Right Atrium: Right atrial size was normal in size.  Pericardium: There is no evidence of pericardial effusion.  Mitral Valve: The mitral valve is grossly normal. Trivial mitral valve regurgitation.  Tricuspid Valve: The tricuspid valve is normal in structure. Tricuspid valve regurgitation is not demonstrated.  Aortic Valve: The aortic valve is tricuspid. Aortic valve regurgitation is trivial. Aortic valve sclerosis is present, with no evidence of aortic valve stenosis. Aortic valve mean gradient measures 5.0 mmHg. Aortic valve peak gradient measures 8.5 mmHg. Aortic valve area, by VTI measures 3.16 cm.  Pulmonic Valve: The pulmonic valve was normal in structure. Pulmonic valve regurgitation is not visualized.  Aorta: The aortic root and ascending aorta are structurally normal, with no evidence of dilitation.  IAS/Shunts: No atrial level shunt detected by color flow Doppler.   LEFT VENTRICLE PLAX 2D                        Biplane EF (MOD) LVIDd:         4.60 cm         LV Biplane EF:   Left LVIDs:         2.90 cm                          ventricular LV PW:         1.30 cm                          ejection LV IVS:        1.60 cm                          fraction by LVOT diam:     2.50 cm                          2D MOD LV SV:         98                               biplane is LV SV Index:   42                               50.8 %. LVOT Area:     4.91 cm  LV Volumes (MOD) LV vol d, MOD    113.0 ml A2C: LV vol d, MOD    103.0 ml A4C: LV vol s, MOD    61.0 ml A2C: LV vol s, MOD    49.7 ml A4C: LV SV MOD A2C:   52.0 ml LV SV MOD A4C:   103.0 ml LV SV MOD BP:    57.7 ml  RIGHT VENTRICLE RV S prime:     13.10 cm/s TAPSE (M-mode): 2.1 cm  LEFT ATRIUM             Index         RIGHT ATRIUM           Index LA diam:        4.10 cm 1.76 cm/m   RA Area:     13.80 cm  LA Vol (A2C):   93.9 ml 40.33 ml/m  RA Volume:   33.40 ml  14.34 ml/m LA Vol (A4C):   59.8 ml 25.68 ml/m LA Biplane Vol: 79.3 ml 34.06 ml/m AORTIC VALVE                     PULMONIC VALVE AV Area (Vmax):    3.46 cm      PV Vmax:       0.62 m/s AV Area (Vmean):   3.23 cm      PV Vmean:      38.300 cm/s AV Area (VTI):     3.16 cm      PV VTI:        0.136 m AV Vmax:           146.00 cm/s   PV Peak grad:  1.5 mmHg AV Vmean:          104.000 cm/s  PV Mean grad:  1.0 mmHg AV VTI:            0.309 m AV Peak Grad:      8.5 mmHg AV Mean Grad:      5.0 mmHg LVOT Vmax:         103.00 cm/s LVOT Vmean:        68.500 cm/s LVOT VTI:          0.199 m LVOT/AV VTI ratio: 0.64  AORTA Ao Root diam: 3.00 cm Ao Asc diam:  3.50 cm  TRICUSPID VALVE TR Peak grad:   17.3 mmHg TR Mean grad:   12.0 mmHg TR Vmax:        208.00 cm/s TR Vmean:       168.0 cm/s  SHUNTS Systemic VTI:  0.20 m Systemic Diam: 2.50 cm  Lyman Bishop MD Electronically signed by Lyman Bishop MD Signature Date/Time: 08/30/2021/5:14:28 PM    Final              Laboratory Data:  High Sensitivity Troponin:   Recent Labs  Lab 09/12/22 1008 09/12/22 1208  TROPONINIHS 4 57*     Chemistry Recent Labs  Lab 09/12/22 1008  NA 139  K 3.7  CL 102  CO2 24  GLUCOSE 206*  BUN 19  CREATININE 0.88  CALCIUM 9.7  MG 1.9  GFRNONAA >60  ANIONGAP 13    No results for input(s): "PROT", "ALBUMIN", "AST", "ALT", "ALKPHOS", "BILITOT" in the last 168 hours. Lipids No results for input(s): "CHOL", "TRIG", "HDL", "LABVLDL", "LDLCALC", "CHOLHDL" in the last 168 hours.  Hematology Recent Labs  Lab 09/12/22 1008  WBC 10.3  RBC 4.83  HGB 14.2  HCT 42.0  MCV 87.0  MCH 29.4  MCHC 33.8  RDW 13.4  PLT 309   Thyroid  Recent Labs  Lab 09/12/22 1235  TSH 3.970    BNPNo results for input(s): "BNP", "PROBNP" in the last 168  hours.  DDimer No results for input(s): "DDIMER" in the last 168 hours.   Radiology/Studies:  No results found.   Assessment and Plan:   WPW with tachycardia requiring cardioversion - Successful cardioversion.  Currently taking amiodarone 200 mg a day.  I will go ahead and increase her amiodarone to 200 mg twice a day as she continues to have episodes of arrhythmia.  This will help bridge her until she can have ablation performed by electrophysiology.  Elevated troponin - From 4 up to 57 high-sensitivity troponin.  This is secondary to both tachycardia as well as cardioversion.  This is not acute coronary syndrome.  No current chest pain.  She felt some mild tightness post cardioversion.  She did feel chest discomfort however during her tachycardic episode.  Abnormal EKG - There was certainly some ST segment accentuation/depression noted immediately following cardioversion.  When looking at older EKG she does have a baseline subtle ST segment depression noted especially in the inferior leads.  This was likely stunning in the setting of cardioversion.  Recent cardiac catheterization as reviewed above showed no evidence of coronary disease.  Recommend continued observation overnight.  If stable without any further arrhythmia, would discharge on amiodarone 200 mg twice a day and continue her Toprol 50 mg a day.  She has follow-up with electrophysiology.   For questions or updates, please contact [facility] Please consult [email or web address] for contact info under    Signed, Candee Furbish, MD  09/12/2022 7:58 PM

## 2022-09-12 NOTE — ED Notes (Signed)
Bed changed, [facility] notified that [facility] is already on the way and bed shows cleaning. States she will call to expedite that room being cleaned and to go ahead and send patient over via [facility].

## 2022-09-12 NOTE — Sedation Documentation (Signed)
Unable to assess pain d/t procedure

## 2022-09-12 NOTE — ED Notes (Signed)
Called [facility] to advise no Code Stemi

## 2022-09-12 NOTE — H&P (Signed)
History and Physical    [name] [id number] DOB: [birth year] DOA: 09/12/2022  PCP: Evans Lance, MD  Patient coming from: [facility] ED  Chief Complaint: Chest pain  HPI: [name] is a 57 y.o. female with medical history significant of persistent A-fib on amiodarone but not on anticoagulation due to low CHA2DS2-VASc score of 1, Wolff-Parkinson-White syndrome presented to ED with chest pain, shortness of breath, palpitations, and nausea.  Tachycardic to 200s on arrival and noted to be in SVT, diaphoretic, and became hypotensive.  She was emergently cardioverted with return to sinus rhythm and blood pressure improved.  Post cardioversion EKG concerning for ST depressions in the inferolateral leads and elevations in aVR.  Code STEMI activated which was later canceled by cardiology.  High-sensitivity troponin 4> 57.  No electrolyte derangements on BMP and magnesium 1.9.  TSH normal, free T4 pending.  Cardiology recommended admission back medicine service for observation overnight on telemetry.  Patient states this morning around 4 AM she woke up from her sleep with severe 10 out of 10 substernal chest pressure, palpitations, shortness of breath, and nausea.  She took 3 nitroglycerin tablets, metoprolol, and amiodarone but it did not help which prompted her to seek medical attention.  Symptoms have now improved after she underwent cardioversion but still having some mild 3 out of 10 intensity chest pressure.  She reports history of persistent A-fib since 2018 and takes amiodarone 200 mg daily and metoprolol succinate 50 mg twice daily.  She has not missed any doses of her home medications.  Also takes aspirin 81 mg daily.  Review of Systems:  Review of Systems  All other systems reviewed and are negative.   Past Medical History:  Diagnosis Date   Atrial fibrillation with rapid ventricular response ([place]) 12/24/2016   Atrial fibrillation with RVR ([place]) 12/26/2016   Chest pain  12/24/2016   Elevated troponin 12/25/2016   Hypokalemia 12/24/2016   Leukocytosis 12/24/2016   Sepsis ([place])    WPW (Wolff-Parkinson-White syndrome) 12/26/2016    Past Surgical History:  Procedure Laterality Date   ABDOMINAL HYSTERECTOMY     KNEE SURGERY     LEFT HEART CATH AND CORONARY ANGIOGRAPHY N/A 08/30/2021   Procedure: LEFT HEART CATH AND CORONARY ANGIOGRAPHY;  Surgeon: Early Osmond, MD;  Location: [facility];  Service: Cardiovascular;  Laterality: N/A;   V TACH ABLATION N/A 02/24/2017   Procedure: V Tach Ablation WPW Ablation;  Surgeon: Evans Lance, MD;  Location: [facility];  Service: Cardiovascular;  Laterality: N/A;     reports that she has never smoked. She has never used smokeless tobacco. She reports current alcohol use. She reports that she does not use drugs.  Allergies  Allergen Reactions   Penicillins Anaphylaxis, Shortness Of Breath and Swelling    Has patient had a PCN reaction causing immediate rash, facial/tongue/throat swelling, SOB or lightheadedness with hypotension: Yes Has patient had a PCN reaction causing severe rash involving mucus membranes or skin necrosis: No Has patient had a PCN reaction that required hospitalization: While hospitalized Has patient had a PCN reaction occurring within the last 10 years: No If all of the above answers are "NO", then may proceed with Cephalosporin use.    Bee Venom Swelling and Other (See Comments)    Swelling and hardness at the site of bee sting.    Family History  Problem Relation Age of Onset   Diabetes Mellitus II Mother    Heart failure Mother  Heart attack Father    Stroke Father     Prior to Admission medications   Medication Sig Start Date End Date Taking? Authorizing Provider  amiodarone (PACERONE) 200 MG tablet Take 1 tablet (200 mg total) by mouth daily. 08/13/22   Vickie Epley, MD  aspirin EC 81 MG tablet Take 1 tablet (81 mg total) by mouth daily. Swallow whole.  09/26/21   Vickie Epley, MD  metoprolol succinate (TOPROL-XL) 50 MG 24 hr tablet Take 1 tablet (50 mg total) by mouth in the morning and at bedtime. Take with or immediately following a meal. 08/13/22   Vickie Epley, MD  nitroGLYCERIN (NITROSTAT) 0.4 MG SL tablet Take one tablet by mouth as needed for exertional chest pain discomfort.  May take 1 tablet by mouth daily. 08/22/22   Vickie Epley, MD    Physical Exam: Vitals:   09/12/22 1715 09/12/22 1827 09/12/22 1844 09/12/22 1900  BP: (!) 150/77 138/72    Pulse: 63 60 67 64  Resp: 14 15 (!) 22 17  Temp: 98.4 F (36.9 C) 98.3 F (36.8 C)    TempSrc:  Oral    SpO2: 97% 94% 96% 97%  Weight:   125.8 kg   Height:  '5\' 9"'$  (1.753 m)      Physical Exam Vitals reviewed.  Constitutional:      General: She is not in acute distress. HENT:     Head: Normocephalic and atraumatic.  Eyes:     Extraocular Movements: Extraocular movements intact.  Cardiovascular:     Rate and Rhythm: Normal rate and regular rhythm.     Pulses: Normal pulses.  Pulmonary:     Effort: Pulmonary effort is normal. No respiratory distress.     Breath sounds: Normal breath sounds. No wheezing or rales.  Abdominal:     General: Bowel sounds are normal. There is no distension.     Palpations: Abdomen is soft.     Tenderness: There is no abdominal tenderness.  Musculoskeletal:     Cervical back: Normal range of motion.     Comments: Mild nonpitting bilateral pedal edema  Skin:    General: Skin is warm and dry.  Neurological:     General: No focal deficit present.     Mental Status: She is alert and oriented to person, place, and time.     Labs on Admission: I have personally reviewed following labs and imaging studies  CBC: Recent Labs  Lab 09/12/22 1008  WBC 10.3  HGB 14.2  HCT 42.0  MCV 87.0  PLT [id number]   Basic Metabolic Panel: Recent Labs  Lab 09/12/22 1008  NA 139  K 3.7  CL 102  CO2 24  GLUCOSE 206*  BUN 19  CREATININE 0.88   CALCIUM 9.7  MG 1.9   GFR: Estimated Creatinine Clearance: 100.2 mL/min (by C-G formula based on SCr of 0.88 mg/dL). Liver Function Tests: No results for input(s): "AST", "ALT", "ALKPHOS", "BILITOT", "PROT", "ALBUMIN" in the last 168 hours. No results for input(s): "LIPASE", "AMYLASE" in the last 168 hours. No results for input(s): "AMMONIA" in the last 168 hours. Coagulation Profile: No results for input(s): "INR", "PROTIME" in the last 168 hours. Cardiac Enzymes: No results for input(s): "CKTOTAL", "CKMB", "CKMBINDEX", "TROPONINI" in the last 168 hours. BNP (last 3 results) No results for input(s): "PROBNP" in the last 8760 hours. HbA1C: No results for input(s): "HGBA1C" in the last 72 hours. CBG: No results for input(s): "GLUCAP" in the last 168  hours. Lipid Profile: No results for input(s): "CHOL", "HDL", "LDLCALC", "TRIG", "CHOLHDL", "LDLDIRECT" in the last 72 hours. Thyroid Function Tests: Recent Labs    09/12/22 1235  TSH 3.970   Anemia Panel: No results for input(s): "VITAMINB12", "FOLATE", "FERRITIN", "TIBC", "IRON", "RETICCTPCT" in the last 72 hours. Urine analysis:    Component Value Date/Time   COLORURINE AMBER (A) 12/25/2016 0105   APPEARANCEUR CLOUDY (A) 12/25/2016 0105   LABSPEC 1.025 12/25/2016 0105   PHURINE 5.0 12/25/2016 0105   GLUCOSEU NEGATIVE 12/25/2016 0105   HGBUR SMALL (A) 12/25/2016 0105   BILIRUBINUR NEGATIVE 12/25/2016 0105   KETONESUR 5 (A) 12/25/2016 0105   PROTEINUR 30 (A) 12/25/2016 0105   UROBILINOGEN 0.2 04/26/2011 1635   NITRITE NEGATIVE 12/25/2016 0105   LEUKOCYTESUR LARGE (A) 12/25/2016 0105    Radiological Exams on Admission: No results found.  Assessment and Plan  SVT History of A-fib and Wolff-Parkinson-White syndrome Patient was initially unstable in the ED with heart rate in the 200s, diaphoretic, and hypotensive.  She was emergently cardioverted with return to sinus rhythm and blood pressure improved.  No electrolyte  derangements on BMP and magnesium 1.9.  TSH normal, free T4 pending.  She takes amiodarone 200 mg daily and metoprolol 50 mg twice daily at home and reports compliance.  Already took amiodarone this morning along with her morning dose of metoprolol. Not on chronic anticoagulation for A-fib due to low CHA2DS2-VASc score of 1.  Cardiology consulted and currently at bedside.  Continue management per cardiology recommendations.  Chest pain and elevated troponin Per patient, she was initially having 10 out of 10 substernal chest pressure but now much improved after cardioversion and currently 3 out of 10 in intensity.  Post cardioversion EKG concerning for ST depressions in the inferolateral leads and elevations in aVR.  Code STEMI was activated by ED provider which was later canceled by cardiology. High-sensitivity troponin 4> 57.  Left heart cath done in March 2023 showing normal right dominant coronary circulation.  Continue cardiac monitoring and trend troponin.  Cardiology consulted as mentioned above.  Hyperglycemia No documented history of diabetes and random blood glucose >200.  Check A1c and order sensitive sliding scale insulin ACHS.  DVT prophylaxis: Lovenox Code Status: Full Code (discussed with the patient) Family Communication: No family available at this time. Consults called: Cardiology (Dr. Marlou Porch) Level of care: Telemetry bed Admission status: It is my clinical opinion that referral for OBSERVATION is reasonable and necessary in this patient based on the above information provided. The aforementioned taken together are felt to place the patient at high risk for further clinical deterioration. However, it is anticipated that the patient may be medically stable for discharge from the hospital within 24 to 48 hours.   Shela Leff MD [facility]  If 7PM-7AM, please contact night-coverage [email or web address]  09/12/2022, 7:21 PM

## 2022-09-12 NOTE — ED Notes (Signed)
Dr Naasz at bedside  

## 2022-09-12 NOTE — ED Triage Notes (Signed)
Pt started having chest pressure, palpitations, nausea at 0430. Hx of A.fib with WPW. Tachycardic at 204 in triage.  Took 3 nitroglycerin, 3 metoprolol and 1 amiodarone prior to 0900.

## 2022-09-12 NOTE — ED Notes (Signed)
Patient diaphoretic, tachypnic, c/o 10/10 chest pain and becoming hypotensive, Dr. Mayra Neer discussing cardioversion with patient and family.

## 2022-09-12 NOTE — ED Provider Notes (Signed)
[facility] Provider Note   CSN: [id number] Arrival date & time: 09/12/22  [id number]     History  Chief Complaint  Patient presents with   Tachycardia    [name] is a 57 y.o. female with Afib, WPW,  presents with palpitations, lightheadedness, intermittent tunnel vision, chest pressure, and SOB that started at acutely 0400 AM this morning. Woke her from sleep. Has history of afib/flutter and tried taking her 3 doses of  metoprolol this morning as previously instructed by cardiology but it didn't help. She denies associated symptoms including fever, vomiting, cough or preceding shortness of breath. Per chart review had this happen before in 08/2021, found to be in SVT with rate ~170s, but then her rate changed to atrial fibrillation and she was admitted on a dilt drip.  Did have an elevation in her troponins at that time and was diagnosed with an NSTEMI. Per chart review, had a diagnosis of WPW in the past but had EP study in 2018 that did not show any accessory pathway but rather inducible Afib.   On arrival patient's heart rate found to be 200 bpm and regular.  Patient moved immediately to an ED bed.  HPI     Home Medications Prior to Admission medications   Medication Sig Start Date End Date Taking? Authorizing Provider  amiodarone (PACERONE) 200 MG tablet Take 1 tablet (200 mg total) by mouth daily. 08/13/22   Vickie Epley, MD  aspirin EC 81 MG tablet Take 1 tablet (81 mg total) by mouth daily. Swallow whole. 09/26/21   Vickie Epley, MD  metoprolol succinate (TOPROL-XL) 50 MG 24 hr tablet Take 1 tablet (50 mg total) by mouth in the morning and at bedtime. Take with or immediately following a meal. 08/13/22   Vickie Epley, MD  nitroGLYCERIN (NITROSTAT) 0.4 MG SL tablet Take one tablet by mouth as needed for exertional chest pain discomfort.  May take 1 tablet by mouth daily. 08/22/22   Vickie Epley, MD       Allergies    Penicillins and Bee venom    Review of Systems   Review of Systems Review of systems Negative for f/c.  A 10 point review of systems was performed and is negative unless otherwise reported in HPI.  Physical Exam Updated Vital Signs BP 132/81   Pulse 73   Temp 97.9 F (36.6 C) (Oral)   Resp (!) 21   Ht 5\' 9"  (1.753 m)   Wt 128.4 kg   SpO2 99%   BMI 41.81 kg/m  Physical Exam General: Uncomfortable appearing appearing female, lying in bed.  HEENT: PERRLA, Sclera anicteric, MMM, trachea midline.  Cardiology: Very rapid regular tachycardic rate, no murmurs/rubs/gallops. BL radial and DP pulses equal bilaterally.  Resp: Normal respiratory rate and effort. CTAB, no wheezes, rhonchi, crackles.  Abd: Soft, non-tender, non-distended. No rebound tenderness or guarding.  GU: Deferred. MSK: No peripheral edema or signs of trauma. Extremities without deformity or TTP. No cyanosis or clubbing. Skin: warm, dry.  Diaphoretic Neuro: A&Ox4, CNs II-XII grossly intact. MAEs. Sensation grossly intact.  Psych: Anxious affect  ED Results / Procedures / Treatments   Labs (all labs ordered are listed, but only abnormal results are displayed) Labs Reviewed  CBC  BASIC METABOLIC PANEL  PREGNANCY, URINE  MAGNESIUM  TROPONIN I (HIGH SENSITIVITY)    EKG EKG Interpretation  Date/Time:  Thursday September 12 2022 10:21:03 EDT Ventricular Rate:  59 PR Interval:  177 QRS Duration: 100 QT Interval:  401 QTC Calculation: 398 R Axis:   24 Text Interpretation: Sinus rhythm Atrial premature complex Consider left atrial enlargement Repol abnrm with inferolateral depressions and elevation aVR Confirmed by Cindee Lame [phone]) on 09/12/2022 10:39:03 AM  Radiology No results found.  Procedures .Critical Care  Performed by: Audley Hose, MD Authorized by: Audley Hose, MD   Critical care provider statement:    Critical care time (minutes):  45   Critical care was necessary to  treat or prevent imminent or life-threatening deterioration of the following conditions:  Shock and cardiac failure   Critical care was time spent personally by me on the following activities:  Development of treatment plan with patient or surrogate, discussions with consultants, evaluation of patient's response to treatment, examination of patient, ordering and review of laboratory studies, ordering and review of radiographic studies, ordering and performing treatments and interventions, pulse oximetry, re-evaluation of patient's condition, review of old charts and obtaining history from patient or surrogate .Cardioversion  Date/Time: 09/12/2022 12:31 PM  Performed by: Audley Hose, MD Authorized by: Audley Hose, MD   Consent:    Consent obtained:  Emergent situation Pre-procedure details:    Cardioversion basis:  Emergent   Rhythm:  Supraventricular tachycardia   Electrode placement:  Anterior-posterior Patient sedated: Yes. Refer to sedation procedure documentation for details of sedation.  Attempt one:    Cardioversion mode:  Synchronous   Waveform:  Monophasic   Shock (Joules):  120   Shock outcome:  Conversion to normal sinus rhythm Post-procedure details:    Patient status:  Awake   Patient tolerance of procedure:  Tolerated well, no immediate complications .Sedation  Date/Time: 09/12/2022 12:32 PM  Performed by: Audley Hose, MD Authorized by: Audley Hose, MD   Consent:    Consent obtained:  Emergent situation Universal protocol:    Immediately prior to procedure, a time out was called: yes     Patient identity confirmed:  Arm band Indications:    Procedure performed:  Cardioversion   Procedure necessitating sedation performed by:  Physician performing sedation Pre-sedation assessment:    Time since last food or drink:  3 hours   NPO status caution: unable to specify NPO status     ASA classification: class 2 - patient with mild systemic disease     Mouth  opening:  3 or more finger widths   Thyromental distance:  2 finger widths   Mallampati score:  III - soft palate, base of uvula visible   Neck mobility: normal     Pre-sedation assessments completed and reviewed: pre-procedure airway patency not reviewed, pre-procedure cardiovascular function not reviewed, pre-procedure mental status not reviewed, pre-procedure nausea and vomiting status not reviewed, pre-procedure pain level not reviewed, pre-procedure respiratory function not reviewed and pre-procedure temperature not reviewed   Immediate pre-procedure details:    Reviewed: vital signs     Verified: bag valve mask available, emergency equipment available, intubation equipment available, IV patency confirmed, oxygen available, reversal medications available and suction available   Procedure details (see MAR for exact dosages):    Preoxygenation:  Nasal cannula   Sedation:  Etomidate   Intended level of sedation: deep   Intra-procedure monitoring:  Blood pressure monitoring, continuous capnometry, continuous pulse oximetry, cardiac monitor, frequent LOC assessments and frequent vital sign checks   Intra-procedure events: hypotension     Intra-procedure management:  Airway repositioning   Total Provider sedation time (minutes):  12 Post-procedure details:  Post-sedation assessments completed and reviewed: post-procedure airway patency not reviewed, post-procedure cardiovascular function not reviewed, post-procedure hydration status not reviewed, post-procedure mental status not reviewed, post-procedure nausea and vomiting status not reviewed, pain score not reviewed, post-procedure respiratory function not reviewed and post-procedure temperature not reviewed     Patient is stable for discharge or admission: yes     Procedure completion:  Tolerated well, no immediate complications     Medications Ordered in ED Medications  ondansetron (ZOFRAN) injection 4 mg (4 mg Intravenous Given 09/12/22  1014)  etomidate (AMIDATE) injection 10 mg (10 mg Intravenous Given 09/12/22 1018)    ED Course/ Medical Decision Making/ A&P                          Medical Decision Making Amount and/or Complexity of Data Reviewed Labs: ordered. Decision-making details documented in ED Course.  Risk Prescription drug management. Decision regarding hospitalization.    This patient presents to the ED for concern of palpitations/lightheadedness, this involves an extensive number of treatment options, and is a complaint that carries with it a high risk of complications and morbidity.  I considered the following differential and admission for this acute, potentially life threatening condition.   MDM:    Patient was found to be in SVT with unstable BP and lightheadedness/chest pressure. Also considered her h/o WPW but EKG showed narrow QRS, and it seems she did not actually have WPW. Consider ACS, electrolyte derangements, dehydration, anemia, thyroid abnormalities, or infection such as UTI causing her symptoms. Patient was immediately brought back to a room.   Clinical Course as of 09/28/22 1456  Thu Sep 12, 2022  1028 Patient noted to be in SVT on EKG as well as on the telemetry and had an initial blood pressures in the 0000000 systolic but subsequently had blood pressure in the 90s and then 80 systolic.  She was sedated with etomidate and given Zofran as well and emergently cardioverted with 120 synchronized joules with return to sinus tachycardia [HN]  1028 Had 1 blood pressure in the 60s immediately after cardioversion but the next blood pressure afterward was in the Q000111Q systolic without any intervention.  On review of her prior episode of SVT in March 2023 she did have depressions noted in the inferolateral leads with elevations in aVR.  She has the same represented on her postcardioversion EKG here however the depressions and elevation appear to be greater in amplitude.  In 323 she was admitted and  diagnosed with CAD/NSTEMI after that event.  Will page cardiology to evaluate EKG to determine if we need to call a STEMI. [HN]  1032 CBC wnl [HN]  1046 Paged 2 cardiology providers total of 3 times without response. Will call code STEMI for concerning depressions/aVR elevation [HN]  1048 Troponin I (High Sensitivity): 4 [HN]  5 D/w Dr. Harriet Masson and cancelled code STEMI [HN]  [id number] Basic metabolic panel(!) No electrolyte derangements [HN]  1149 Magnesium: 1.9 [HN]  1423 Troponin I (High Sensitivity)(!): 57 [HN]    Clinical Course User Index [HN] Audley Hose, MD    Labs: I Ordered, and personally interpreted labs.  The pertinent results include:  those listed above  Additional history obtained from chart review, husband at bedside.   Cardiac Monitoring: The patient was maintained on a cardiac monitor.  I personally viewed and interpreted the cardiac monitored which showed an underlying rhythm of: SVT then sinus tachycardia then NSR  Reevaluation: After the interventions  noted above, I reevaluated the patient and found that they have :resolved  Social Determinants of Health: Patient lives independently   Disposition:  D/w cardiology who feels that her EKG changes are due to cardioversion. D/w cardiology who requests patient be admitted for obs/telemetry.     Co morbidities that complicate the patient evaluation  Past Medical History:  Diagnosis Date   Atrial fibrillation with RVR ([place]) 12/26/2016   Sepsis ([place])    WPW (Wolff-Parkinson-White syndrome) 12/26/2016     Medicines Meds ordered this encounter  Medications   ondansetron (ZOFRAN) injection 4 mg   etomidate (AMIDATE) 2 MG/ML injection    Noah, Alexandra N: cabinet override   ondansetron (ZOFRAN) 4 MG/2ML injection    Athena Masse N: cabinet override   etomidate (AMIDATE) injection 10 mg   DISCONTD: phenylephrine 80 mcg/10 mL injection    Janett Labella D: cabinet override    I have reviewed the  patients home medicines and have made adjustments as needed  Problem List / ED Course: Problem List Items Addressed This Visit   None Visit Diagnoses     SVT (supraventricular tachycardia)    -  Primary                   This note was created using dictation software, which may contain spelling or grammatical errors.    Audley Hose, MD 09/28/22 1500

## 2022-09-12 NOTE — ED Notes (Signed)
ED TO INPATIENT HANDOFF REPORT  ED Nurse Name and Phone #: Angelica Chessman Name/Age/Gender [name] 57 y.o. female Room/Bed: MH07/MH07  Code Status   Code Status: Prior  Home/SNF/Other Home Patient oriented to: self, place, time, and situation Is this baseline? Yes   Triage Complete: Triage complete  Chief Complaint SVT (supraventricular tachycardia) [I47.10]  Triage Note Pt started having chest pressure, palpitations, nausea at 0430. Hx of A.fib with WPW. Tachycardic at 204 in triage.  Took 3 nitroglycerin, 3 metoprolol and 1 amiodarone prior to 0900.   Allergies Allergies  Allergen Reactions   Penicillins Anaphylaxis, Shortness Of Breath and Swelling    Has patient had a PCN reaction causing immediate rash, facial/tongue/throat swelling, SOB or lightheadedness with hypotension: Yes Has patient had a PCN reaction causing severe rash involving mucus membranes or skin necrosis: No Has patient had a PCN reaction that required hospitalization: While hospitalized Has patient had a PCN reaction occurring within the last 10 years: No If all of the above answers are "NO", then may proceed with Cephalosporin use.    Bee Venom Swelling and Other (See Comments)    Swelling and hardness at the site of bee sting.    Level of Care/Admitting Diagnosis ED Disposition     ED Disposition  Admit   Condition  --   Comment  Hospital Area: [facility] [[id number]]  Level of Care: Telemetry Cardiac [[id number]]  Interfacility transfer: Yes  May place patient in observation at [facility] or [place] if equivalent level of care is available:: No  Covid Evaluation: Asymptomatic - no recent exposure (last 10 days) testing not required  Diagnosis: SVT (supraventricular tachycardia) [202906]  Admitting Physician: Marcelyn Bruins [id number]  Attending Physician: Marcelyn Bruins [id number]          B Medical/Surgery History Past Medical History:   Diagnosis Date   Atrial fibrillation with rapid ventricular response ([place]) 12/24/2016   Atrial fibrillation with RVR ([place]) 12/26/2016   Chest pain 12/24/2016   Elevated troponin 12/25/2016   Hypokalemia 12/24/2016   Leukocytosis 12/24/2016   Sepsis ([place])    WPW (Wolff-Parkinson-White syndrome) 12/26/2016   Past Surgical History:  Procedure Laterality Date   ABDOMINAL HYSTERECTOMY     KNEE SURGERY     LEFT HEART CATH AND CORONARY ANGIOGRAPHY N/A 08/30/2021   Procedure: LEFT HEART CATH AND CORONARY ANGIOGRAPHY;  Surgeon: Early Osmond, MD;  Location: [place] [facility] [facility];  Service: Cardiovascular;  Laterality: N/A;   V TACH ABLATION N/A 02/24/2017   Procedure: V Tach Ablation WPW Ablation;  Surgeon: Evans Lance, MD;  Location: [place] [facility];  Service: Cardiovascular;  Laterality: N/A;     A IV Location/Drains/Wounds Patient Lines/Drains/Airways Status     Active Line/Drains/Airways     Name Placement date Placement time Site Days   Peripheral IV 09/12/22 20 G Left Antecubital 09/12/22  1006  Antecubital  less than 1            Intake/Output Last 24 hours No intake or output data in the 24 hours ending 09/12/22 1628  Labs/Imaging Results for orders placed or performed during the hospital encounter of 09/12/22 (from the past 48 hour(s))  Basic metabolic panel     Status: Abnormal   Collection Time: 09/12/22 10:08 AM  Result Value Ref Range   Sodium 139 135 - 145 mmol/L   Potassium 3.7 3.5 - 5.1 mmol/L   Chloride 102 98 - 111 mmol/L  CO2 24 22 - 32 mmol/L   Glucose, Bld 206 (H) 70 - 99 mg/dL    Comment: Glucose reference range applies only to samples taken after fasting for at least 8 hours.   BUN 19 6 - 20 mg/dL   Creatinine, Ser 0.88 0.44 - 1.00 mg/dL   Calcium 9.7 8.9 - 10.3 mg/dL   GFR, Estimated >60 >60 mL/min    Comment: (NOTE) Calculated using the CKD-EPI Creatinine Equation (2021)    Anion gap 13 5 - 15    Comment: Performed at [facility], [place] [address], [place], [place] [address]  CBC     Status: None   Collection Time: 09/12/22 10:08 AM  Result Value Ref Range   WBC 10.3 4.0 - 10.5 K/uL   RBC 4.83 3.87 - 5.11 MIL/uL   Hemoglobin 14.2 12.0 - 15.0 g/dL   HCT 42.0 36.0 - 46.0 %   MCV 87.0 80.0 - 100.0 fL   MCH 29.4 26.0 - 34.0 pg   MCHC 33.8 30.0 - 36.0 g/dL   RDW 13.4 11.5 - 15.5 %   Platelets 309 150 - 400 K/uL   nRBC 0.0 0.0 - 0.2 %    Comment: Performed at [facility], [address] [place], [place] [address]  Troponin I (High Sensitivity)     Status: None   Collection Time: 09/12/22 10:08 AM  Result Value Ref Range   Troponin I (High Sensitivity) 4 <18 ng/L    Comment: (NOTE) Elevated high sensitivity troponin I (hsTnI) values and significant  changes across serial measurements may suggest ACS but many other  chronic and acute conditions are known to elevate hsTnI results.  Refer to the "Links" section for chest pain algorithms and additional  guidance. Performed at [facility], [address] [place], [place] [address]   Magnesium     Status: None   Collection Time: 09/12/22 10:08 AM  Result Value Ref Range   Magnesium 1.9 1.7 - 2.4 mg/dL    Comment: Performed at [facility], [address] [place], [place] [address]  Troponin I (High Sensitivity)     Status: Abnormal   Collection Time: 09/12/22 12:08 PM  Result Value Ref Range   Troponin I (High Sensitivity) 57 (H) <18 ng/L    Comment: READ BACK AND VERIFIED WITH A. NOAH RN [phone] 09/12/22 MB (NOTE) Elevated high sensitivity troponin I (hsTnI) values and significant  changes across serial measurements may suggest ACS but many other  chronic and acute conditions are known to elevate hsTnI results.  Refer to the "Links" section for chest pain algorithms and additional  guidance. Performed at [facility], [address] [place], [place] [address]   Pregnancy, urine     Status: None    Collection Time: 09/12/22  1:00 PM  Result Value Ref Range   Preg Test, Ur NEGATIVE NEGATIVE    Comment:        THE SENSITIVITY OF THIS METHODOLOGY IS >20 mIU/mL. Performed at [facility], [address] [place], [place] [address]    No results found.  Pending Labs Unresulted Labs (From admission, onward)     Start     Ordered   09/12/22 1235  TSH  Once,   URGENT        09/12/22 1235   09/12/22 1235  T4, free  Once,   URGENT  09/12/22 1235            Vitals/Pain Today's Vitals   09/12/22 1530 09/12/22 1545 09/12/22 1600 09/12/22 1615  BP: 139/83 (!) 147/76 136/75 (!) 146/93  Pulse: (!) 58 (!) 56 (!) 58 (!) 59  Resp: '15 15 16 14  '$ Temp:      TempSrc:      SpO2: 94% 95% 95% 98%  Weight:      Height:      PainSc:        Isolation Precautions No active isolations  Medications Medications  ondansetron (ZOFRAN) injection 4 mg (4 mg Intravenous Given 09/12/22 1014)  etomidate (AMIDATE) injection 10 mg (10 mg Intravenous Given 09/12/22 1019)    Mobility walks     Focused Assessments Cardiac Assessment Handoff:  Cardiac Rhythm: Normal sinus rhythm Lab Results  Component Value Date   TROPONINI 0.49 (HH) 12/25/2016   Lab Results  Component Value Date   DDIMER  08/20/2010    0.46        AT THE INHOUSE ESTABLISHED CUTOFF VALUE OF 0.48 ug/mL FEU, THIS ASSAY HAS BEEN DOCUMENTED IN THE LITERATURE TO HAVE A SENSITIVITY AND NEGATIVE PREDICTIVE VALUE OF AT LEAST 98 TO 99%.  THE TEST RESULT SHOULD BE CORRELATED WITH AN ASSESSMENT OF THE CLINICAL PROBABILITY OF DVT / VTE.   Does the Patient currently have chest pain? No    R Recommendations: See Admitting Provider Note  Report given to:   Additional Notes: Pt is alert and oriented x 4. Room air, no complaints of chest pain at this time. Pt has been NSR since cardioversion this morning.

## 2022-09-12 NOTE — Progress Notes (Signed)
TRH admit paged to assign MD and get orders for new patient.

## 2022-09-12 NOTE — ED Notes (Signed)
Placed on end tidal CO2 monitor with O2 at 4lpm [place] for procedure.

## 2022-09-13 LAB — GLUCOSE, CAPILLARY: Glucose-Capillary: 103 mg/dL — ABNORMAL HIGH (ref 70–99)

## 2022-09-13 LAB — BASIC METABOLIC PANEL
Anion gap: 9 (ref 5–15)
BUN: 19 mg/dL (ref 6–20)
CO2: 28 mmol/L (ref 22–32)
Calcium: 9.1 mg/dL (ref 8.9–10.3)
Chloride: 102 mmol/L (ref 98–111)
Creatinine, Ser: 0.9 mg/dL (ref 0.44–1.00)
GFR, Estimated: >60 mL/min (ref >60)
Glucose, Bld: 116 mg/dL — ABNORMAL HIGH (ref 70–99)
Potassium: 3.5 mmol/L (ref 3.5–5.1)
Sodium: 139 mmol/L (ref 135–145)

## 2022-09-13 LAB — TROPONIN I (HIGH SENSITIVITY)
Troponin I (High Sensitivity): 131 ng/L (ref <18)
Troponin I (High Sensitivity): 57 ng/L — ABNORMAL HIGH (ref <18)

## 2022-09-13 LAB — HEMOGLOBIN A1C
Hgb A1c MFr Bld: 6.1 % — ABNORMAL HIGH (ref 4.8–5.6)
Mean Plasma Glucose: 128 mg/dL

## 2022-09-13 MED ORDER — SENNOSIDES-DOCUSATE SODIUM 8.6-50 MG PO TABS: 2.0000 | ORAL_TABLET | Freq: Once | ORAL | Status: DC

## 2022-09-13 MED ORDER — AMIODARONE HCL 200 MG PO TABS: 200.0000 mg | ORAL_TABLET | Freq: Two times a day (BID) | ORAL | 1 refills | Status: DC

## 2022-09-13 NOTE — Discharge Instructions (Signed)
Follow with Cardiology as scheduled on 3/28  Please get a complete blood count and chemistry panel checked by your Primary MD at your next visit, and again as instructed by your Primary MD. Please get your medications reviewed and adjusted by your Primary MD.  Please request your Primary MD to go over all Hospital Tests and Procedure/Radiological results at the follow up, please get all Hospital records sent to your Prim MD by signing hospital release before you go home.  In some cases, there will be blood work, cultures and biopsy results pending at the time of your discharge. Please request that your primary care M.D. goes through all the records of your hospital data and follows up on these results.  If you had Pneumonia of Lung problems at the Hospital: Please get a 2 view Chest X ray done in 6-8 weeks after hospital discharge or sooner if instructed by your Primary MD.  If you have Congestive Heart Failure: Please call your Cardiologist or Primary MD anytime you have any of the following symptoms:  1) 3 pound weight gain in 24 hours or 5 pounds in 1 week  2) shortness of breath, with or without a dry hacking cough  3) swelling in the hands, feet or stomach  4) if you have to sleep on extra pillows at night in order to breathe  Follow cardiac low salt diet and 1.5 lit/day fluid restriction.  If you have diabetes Accuchecks 4 times/day, Once in AM empty stomach and then before each meal. Log in all results and show them to your primary doctor at your next visit. If any glucose reading is under 80 or above 300 call your primary MD immediately.  If you have Seizure/Convulsions/Epilepsy: Please do not drive, operate heavy machinery, participate in activities at heights or participate in high speed sports until you have seen by Primary MD or a Neurologist and advised to do so again. Per [facility] statutes, patients with seizures are not allowed to drive until they have been  seizure-free for six months.  Use caution when using heavy equipment or power tools. Avoid working on ladders or at heights. Take showers instead of baths. Ensure the water temperature is not too high on the home water heater. Do not go swimming alone. Do not lock yourself in a room alone (i.e. bathroom). When caring for infants or small children, sit down when holding, feeding, or changing them to minimize risk of injury to the child in the event you have a seizure. Maintain good sleep hygiene. Avoid alcohol.   If you had Gastrointestinal Bleeding: Please ask your Primary MD to check a complete blood count within one week of discharge or at your next visit. Your endoscopic/colonoscopic biopsies that are pending at the time of discharge, will also need to followed by your Primary MD.  Get Medicines reviewed and adjusted. Please take all your medications with you for your next visit with your Primary MD  Please request your Primary MD to go over all hospital tests and procedure/radiological results at the follow up, please ask your Primary MD to get all Hospital records sent to his/her office.  If you experience worsening of your admission symptoms, develop shortness of breath, life threatening emergency, suicidal or homicidal thoughts you must seek medical attention immediately by calling 911 or calling your MD immediately  if symptoms less severe.  You must read complete instructions/literature along with all the possible adverse reactions/side effects for all the Medicines you take and  that have been prescribed to you. Take any new Medicines after you have completely understood and accpet all the possible adverse reactions/side effects.   Do not drive or operate heavy machinery when taking Pain medications.   Do not take more than prescribed Pain, Sleep and Anxiety Medications  Special Instructions: If you have smoked or chewed Tobacco  in the last 2 yrs please stop smoking, stop any regular  Alcohol  and or any Recreational drug use.  Wear Seat belts while driving.  Please note You were cared for by a hospitalist during your hospital stay. If you have any questions about your discharge medications or the care you received while you were in the hospital after you are discharged, you can call the unit and asked to speak with the hospitalist on call if the hospitalist that took care of you is not available. Once you are discharged, your primary care physician will handle any further medical issues. Please note that NO REFILLS for any discharge medications will be authorized once you are discharged, as it is imperative that you return to your primary care physician (or establish a relationship with a primary care physician if you do not have one) for your aftercare needs so that they can reassess your need for medications and monitor your lab values.  You can reach the hospitalist office at phone [phone] or fax [phone]   If you do not have a primary care physician, you can call [phone] for a physician referral.  Activity: As tolerated with Full fall precautions use walker/cane & assistance as needed    Diet: heart healthy  Disposition Home

## 2022-09-13 NOTE — TOC Transition Note (Signed)
Transition of Care [facility]) - CM/SW Discharge Note   Patient Details  Name: [name] MRN: [id number] Date of Birth: [birth year]  [facility] CM/SW Contact:  Zenon Mayo, RN Phone Number: 09/13/2022, 9:40 AM   Clinical Narrative:    Patient is for dc today, has no needs.  Spouse will transport home.         Patient Goals and CMS Choice      Discharge Placement                         Discharge Plan and Services Additional resources added to the After Visit Summary for                                       Social Determinants of Health (SDOH) Interventions SDOH Screenings   Financial Resource Strain: High Risk (10/08/2021)  Tobacco Use: Low Risk  (09/12/2022)     Readmission Risk Interventions     No data to display

## 2022-09-13 NOTE — Progress Notes (Signed)
Discharge order written. Instructions reviewed and given to patient. IV removed, site CDI. All questions answered. Patient discharged in stable condition into the care of her son.

## 2022-09-13 NOTE — Discharge Summary (Signed)
Physician Discharge Summary  [name] [id number] DOB: [birth year] DOA: 09/12/2022  PCP: Evans Lance, MD  Admit date: 09/12/2022 Discharge date: 09/13/2022  Admitted From: home Disposition:  home  Recommendations for Outpatient Follow-up:  Follow up with cardiology/EP in 2 weeks as scheduled  Home Health: none Equipment/Devices: none  Discharge Condition: stable CODE STATUS: Full code Diet Orders (From admission, onward)     Start     Ordered   09/12/22 2018  Diet Heart Room service appropriate? Yes; Fluid consistency: Thin  Diet effective now       Question Answer Comment  Room service appropriate? Yes   Fluid consistency: Thin      09/12/22 2018            HPI: Per admitting MD, [name] is a 57 y.o. female with medical history significant of persistent A-fib on amiodarone but not on anticoagulation due to low CHA2DS2-VASc score of 1, Wolff-Parkinson-White syndrome presented to ED with chest pain, shortness of breath, palpitations, and nausea.  Tachycardic to 200s on arrival and noted to be in SVT, diaphoretic, and became hypotensive.  She was emergently cardioverted with return to sinus rhythm and blood pressure improved.  Post cardioversion EKG concerning for ST depressions in the inferolateral leads and elevations in aVR.  Code STEMI activated which was later canceled by cardiology.  High-sensitivity troponin 4> 57.  No electrolyte derangements on BMP and magnesium 1.9.  TSH normal, free T4 pending.  Cardiology recommended admission back medicine service for observation overnight on telemetry.   Hospital Course / Discharge diagnoses: Principal Problem:   SVT (supraventricular tachycardia) Active Problems:   Chest pain   Elevated troponin   WPW (Wolff-Parkinson-White syndrome)   Atrial fibrillation (HCC)   Hyperglycemia  Principal problem A-fib with RVR -patient with history of the same, seeing EP as an outpatient with consideration for  ablation in the future.  She was unstable in the ED with rates in the 200s, hypotensive, diaphoretic and was emergently cardioverted.  Cardiology consulted and followed patient while hospitalized.  Her amiodarone was increased from 200 mg daily to 200 mg twice daily.  Her metoprolol has been continued.  With the change of medications, she has remained in sinus, has returned to baseline functional status and will be discharged home in stable condition.  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, she would benefit from weight loss  Sepsis ruled out   Discharge Instructions   Allergies as of 09/13/2022       Reactions   Penicillins Anaphylaxis, Shortness Of Breath, Swelling   Has patient had a PCN reaction causing immediate rash, facial/tongue/throat swelling, SOB or lightheadedness with hypotension: Yes Has patient had a PCN reaction causing severe rash involving mucus membranes or skin necrosis: No Has patient had a PCN reaction that required hospitalization: While hospitalized Has patient had a PCN reaction occurring within the last 10 years: No If all of the above answers are "NO", then may proceed with Cephalosporin use.   Bee Venom Swelling, Other (See Comments)   Swelling and hardness at the site of bee sting.        Medication List     TAKE these medications    amiodarone 200 MG tablet Commonly known as: PACERONE Take 1 tablet (200 mg total) by mouth 2 (two) times daily. What changed: when to take this   aspirin EC 81 MG tablet Take 1 tablet (81 mg total)  by mouth daily. Swallow whole.   metoprolol succinate 50 MG 24 hr tablet Commonly known as: TOPROL-XL Take 1 tablet (50 mg total) by mouth in the morning and at bedtime. Take with or immediately following a meal. What changed: additional instructions   nitroGLYCERIN 0.4 MG SL tablet Commonly known as: NITROSTAT Take one tablet by mouth as needed for  exertional chest pain discomfort.  May take 1 tablet by mouth daily. What changed:  how much to take how to take this when to take this reasons to take this additional instructions         Consultations: Cardiology   Procedures/Studies:  No results found.   Subjective: - no chest pain, shortness of breath, no abdominal pain, nausea or vomiting.   Discharge Exam: BP 129/62 (BP Location: Right Arm)   Pulse 60   Temp 97.7 F (36.5 C) (Oral)   Resp 19   Ht 5\' 9"  (1.753 m)   Wt 125.8 kg   SpO2 98%   BMI 40.96 kg/m   General: Pt is alert, awake, not in acute distress Cardiovascular: RRR, S1/S2 +, no rubs, no gallops Respiratory: CTA bilaterally, no wheezing, no rhonchi Abdominal: Soft, NT, ND, bowel sounds + Extremities: no edema, no cyanosis    The results of significant diagnostics from this hospitalization (including imaging, microbiology, ancillary and laboratory) are listed below for reference.     Microbiology: No results found for this or any previous visit (from the past 240 hour(s)).   Labs: Basic Metabolic Panel: Recent Labs  Lab 09/12/22 1008 09/12/22 2337  NA 139 139  K 3.7 3.5  CL 102 102  CO2 24 28  GLUCOSE 206* 116*  BUN 19 19  CREATININE 0.88 0.90  CALCIUM 9.7 9.1  MG 1.9  --    Liver Function Tests: No results for input(s): "AST", "ALT", "ALKPHOS", "BILITOT", "PROT", "ALBUMIN" in the last 168 hours. CBC: Recent Labs  Lab 09/12/22 1008  WBC 10.3  HGB 14.2  HCT 42.0  MCV 87.0  PLT 309   CBG: Recent Labs  Lab 09/12/22 2157 09/13/22 0626  GLUCAP 169* 103*   Hgb A1c No results for input(s): "HGBA1C" in the last 72 hours. Lipid Profile No results for input(s): "CHOL", "HDL", "LDLCALC", "TRIG", "CHOLHDL", "LDLDIRECT" in the last 72 hours. Thyroid function studies Recent Labs    09/12/22 1235  TSH 3.970   Urinalysis    Component Value Date/Time   COLORURINE AMBER (A) 12/25/2016 0105   APPEARANCEUR CLOUDY (A)  12/25/2016 0105   LABSPEC 1.025 12/25/2016 0105   PHURINE 5.0 12/25/2016 0105   GLUCOSEU NEGATIVE 12/25/2016 0105   HGBUR SMALL (A) 12/25/2016 0105   BILIRUBINUR NEGATIVE 12/25/2016 0105   KETONESUR 5 (A) 12/25/2016 0105   PROTEINUR 30 (A) 12/25/2016 0105   UROBILINOGEN 0.2 04/26/2011 1635   NITRITE NEGATIVE 12/25/2016 0105   LEUKOCYTESUR LARGE (A) 12/25/2016 0105    FURTHER DISCHARGE INSTRUCTIONS:   Get Medicines reviewed and adjusted: Please take all your medications with you for your next visit with your Primary MD   Laboratory/radiological data: Please request your Primary MD to go over all hospital tests and procedure/radiological results at the follow up, please ask your Primary MD to get all Hospital records sent to his/her office.   In some cases, they will be blood work, cultures and biopsy results pending at the time of your discharge. Please request that your primary care M.D. goes through all the records of your hospital data and follows up  on these results.   Also Note the following: If you experience worsening of your admission symptoms, develop shortness of breath, life threatening emergency, suicidal or homicidal thoughts you must seek medical attention immediately by calling 911 or calling your MD immediately  if symptoms less severe.   You must read complete instructions/literature along with all the possible adverse reactions/side effects for all the Medicines you take and that have been prescribed to you. Take any new Medicines after you have completely understood and accpet all the possible adverse reactions/side effects.    Do not drive when taking Pain medications or sleeping medications (Benzodaizepines)   Do not take more than prescribed Pain, Sleep and Anxiety Medications. It is not advisable to combine anxiety,sleep and pain medications without talking with your primary care practitioner   Special Instructions: If you have smoked or chewed Tobacco  in the  last 2 yrs please stop smoking, stop any regular Alcohol  and or any Recreational drug use.   Wear Seat belts while driving.   Please note: You were cared for by a hospitalist during your hospital stay. Once you are discharged, your primary care physician will handle any further medical issues. Please note that NO REFILLS for any discharge medications will be authorized once you are discharged, as it is imperative that you return to your primary care physician (or establish a relationship with a primary care physician if you do not have one) for your post hospital discharge needs so that they can reassess your need for medications and monitor your lab values.  Time coordinating discharge: 25 minutes  SIGNED:  Marzetta Board, MD, PhD 09/13/2022, 8:53 AM

## 2022-09-13 NOTE — Progress Notes (Signed)
Rounding Note    Patient Name: [name] Date of Encounter: 09/13/2022  [place] Cardiologist: Quentin Ore  Subjective   Doing well no further arrhythmias.  No chest pain no shortness of breath.  Husband and son at bedside.  Inpatient Medications    Scheduled Meds:  amiodarone  200 mg Oral BID   enoxaparin (LOVENOX) injection  40 mg Subcutaneous Q24H   insulin aspart  0-5 Units Subcutaneous QHS   insulin aspart  0-9 Units Subcutaneous TID WC   metoprolol succinate  50 mg Oral Daily   Continuous Infusions:  PRN Meds: acetaminophen **OR** acetaminophen   Vital Signs    Vitals:   09/12/22 2034 09/12/22 2044 09/13/22 0012 09/13/22 0432  BP:  (!) 132/57 (!) 117/55 129/66  Pulse:  64 63 61  Resp:  17 15 17   Temp:  98.4 F (36.9 C) 98.2 F (36.8 C) 98.2 F (36.8 C)  TempSrc: Oral Oral Oral Oral  SpO2:  96% 97% 97%  Weight:      Height:        Intake/Output Summary (Last 24 hours) at 09/13/2022 0653 Last data filed at 09/12/2022 2044 Gross per 24 hour  Intake --  Output 0 ml  Net 0 ml      09/12/2022    6:44 PM 09/12/2022   10:03 AM 08/13/2022    7:59 AM  Last 3 Weights  Weight (lbs) 277 lb 5.4 oz 283 lb 1.6 oz 279 lb  Weight (kg) 125.8 kg 128.413 kg 126.554 kg      Telemetry    Sinus rhythm 60s- Personally Reviewed  ECG    No new EKG, priors reviewed- Personally Reviewed  Physical Exam   GEN: No acute distress.   Neck: No JVD Cardiac: RRR, no murmurs, rubs, or gallops.  Respiratory: Clear to auscultation bilaterally. GI: Soft, nontender, non-distended  MS: No edema; No deformity. Neuro:  Nonfocal  Psych: Normal affect   Labs    High Sensitivity Troponin:   Recent Labs  Lab 09/12/22 1008 09/12/22 1208 09/12/22 2119 09/12/22 2337  TROPONINIHS 4 57* 143* 131*     Chemistry Recent Labs  Lab 09/12/22 1008 09/12/22 2337  NA 139 139  K 3.7 3.5  CL 102 102  CO2 24 28  GLUCOSE 206* 116*  BUN 19 19  CREATININE 0.88  0.90  CALCIUM 9.7 9.1  MG 1.9  --   GFRNONAA >60 >60  ANIONGAP 13 9    Lipids No results for input(s): "CHOL", "TRIG", "HDL", "LABVLDL", "LDLCALC", "CHOLHDL" in the last 168 hours.  Hematology Recent Labs  Lab 09/12/22 1008  WBC 10.3  RBC 4.83  HGB 14.2  HCT 42.0  MCV 87.0  MCH 29.4  MCHC 33.8  RDW 13.4  PLT 309   Thyroid  Recent Labs  Lab 09/12/22 1235  TSH 3.970  FREET4 0.91    BNPNo results for input(s): "BNP", "PROBNP" in the last 168 hours.  DDimer No results for input(s): "DDIMER" in the last 168 hours.   Radiology    No results found.  Cardiac Studies   Cardiac Studies & Procedures   CARDIAC CATHETERIZATION  CARDIAC CATHETERIZATION 08/30/2021  Narrative   LV end diastolic pressure is mildly elevated.   The left ventricular ejection fraction is 55-65% by visual estimate.  1.  Normal right dominant coronary circulation. 2.  LVEDP of 20 mmHg with preserved ejection fraction.  The results were reviewed with Dr. Quentin Ore.  Recommendations: Medical therapy.  Findings Coronary  Findings Diagnostic  Dominance: Right  No diagnostic findings have been documented. Intervention  No interventions have been documented.   STRESS TESTS  MYOCARDIAL PERFUSION IMAGING 01/22/2017  Narrative  Nuclear stress EF: 53%.  There was no ST segment deviation noted during stress.  There is a large defect of moderate severity present in the basal anteroseptal, mid anterior, mid anteroseptal, apical anterior and apical septal location. The defect is non-reversible. This is consistent with breast attenuation artifact. No ischemia noted.  This is a low risk study.  The left ventricular ejection fraction is mildly decreased (45-54%).   ECHOCARDIOGRAM  ECHOCARDIOGRAM COMPLETE 08/30/2021  Narrative ECHOCARDIOGRAM REPORT    Patient Name:   [name] Date of Exam: 08/30/2021 Medical Rec #:  [id number]          Height:       69.0 in Accession #:    [id number]          Weight:       265.0 lb Date of Birth:  [birth year]          BSA:          2.328 m Patient Age:    56 years           BP:           109/57 mmHg Patient Gender: F                  HR:           117 bpm. Exam Location:  Inpatient  Procedure: 2D Echo, Cardiac Doppler and Color Doppler  Indications:    Atrial Fibrillation  History:        Patient has no prior history of Echocardiogram examinations.  Sonographer:    Beryle Beams Referring Phys: [id number] RENEE LYNN Robbins   1. Left ventricular ejection fraction, by estimation, is 50 to 55%. Left ventricular ejection fraction by 2D MOD biplane is 50.8 %. The left ventricle has low normal function. The left ventricle has no regional wall motion abnormalities. There is mild left ventricular hypertrophy. Left ventricular diastolic function could not be evaluated. 2. Right ventricular systolic function is normal. The right ventricular size is normal. Tricuspid regurgitation signal is inadequate for assessing PA pressure. 3. Left atrial size was mildly dilated. 4. The mitral valve is grossly normal. Trivial mitral valve regurgitation. 5. The aortic valve is tricuspid. Aortic valve regurgitation is trivial. Aortic valve sclerosis is present, with no evidence of aortic valve stenosis.  Comparison(s): No prior Echocardiogram.  FINDINGS Left Ventricle: Left ventricular ejection fraction, by estimation, is 50 to 55%. Left ventricular ejection fraction by 2D MOD biplane is 50.8 %. The left ventricle has low normal function. The left ventricle has no regional wall motion abnormalities. The left ventricular internal cavity size was normal in size. There is mild left ventricular hypertrophy. Left ventricular diastolic function could not be evaluated due to atrial fibrillation. Left ventricular diastolic function could not be evaluated.  Right Ventricle: The right ventricular size is normal. No increase in right ventricular wall thickness.  Right ventricular systolic function is normal. Tricuspid regurgitation signal is inadequate for assessing PA pressure.  Left Atrium: Left atrial size was mildly dilated.  Right Atrium: Right atrial size was normal in size.  Pericardium: There is no evidence of pericardial effusion.  Mitral Valve: The mitral valve is grossly normal. Trivial mitral valve regurgitation.  Tricuspid Valve: The tricuspid valve is normal in structure. Tricuspid valve regurgitation is not demonstrated.  Aortic Valve: The aortic valve is tricuspid. Aortic valve regurgitation is trivial. Aortic valve sclerosis is present, with no evidence of aortic valve stenosis. Aortic valve mean gradient measures 5.0 mmHg. Aortic valve peak gradient measures 8.5 mmHg. Aortic valve area, by VTI measures 3.16 cm.  Pulmonic Valve: The pulmonic valve was normal in structure. Pulmonic valve regurgitation is not visualized.  Aorta: The aortic root and ascending aorta are structurally normal, with no evidence of dilitation.  IAS/Shunts: No atrial level shunt detected by color flow Doppler.   LEFT VENTRICLE PLAX 2D                        Biplane EF (MOD) LVIDd:         4.60 cm         LV Biplane EF:   Left LVIDs:         2.90 cm                          ventricular LV PW:         1.30 cm                          ejection LV IVS:        1.60 cm                          fraction by LVOT diam:     2.50 cm                          2D MOD LV SV:         98                               biplane is LV SV Index:   42                               50.8 %. LVOT Area:     4.91 cm  LV Volumes (MOD) LV vol d, MOD    113.0 ml A2C: LV vol d, MOD    103.0 ml A4C: LV vol s, MOD    61.0 ml A2C: LV vol s, MOD    49.7 ml A4C: LV SV MOD A2C:   52.0 ml LV SV MOD A4C:   103.0 ml LV SV MOD BP:    57.7 ml  RIGHT VENTRICLE RV S prime:     13.10 cm/s TAPSE (M-mode): 2.1 cm  LEFT ATRIUM             Index        RIGHT ATRIUM            Index LA diam:        4.10 cm 1.76 cm/m   RA Area:     13.80 cm LA Vol (A2C):   93.9 ml 40.33 ml/m  RA Volume:   33.40 ml  14.34 ml/m LA Vol (A4C):   59.8 ml 25.68 ml/m LA Biplane Vol: 79.3 ml 34.06 ml/m AORTIC VALVE                     PULMONIC VALVE AV Area (Vmax):    3.46 cm  PV Vmax:       0.62 m/s AV Area (Vmean):   3.23 cm      PV Vmean:      38.300 cm/s AV Area (VTI):     3.16 cm      PV VTI:        0.136 m AV Vmax:           146.00 cm/s   PV Peak grad:  1.5 mmHg AV Vmean:          104.000 cm/s  PV Mean grad:  1.0 mmHg AV VTI:            0.309 m AV Peak Grad:      8.5 mmHg AV Mean Grad:      5.0 mmHg LVOT Vmax:         103.00 cm/s LVOT Vmean:        68.500 cm/s LVOT VTI:          0.199 m LVOT/AV VTI ratio: 0.64  AORTA Ao Root diam: 3.00 cm Ao Asc diam:  3.50 cm  TRICUSPID VALVE TR Peak grad:   17.3 mmHg TR Mean grad:   12.0 mmHg TR Vmax:        208.00 cm/s TR Vmean:       168.0 cm/s  SHUNTS Systemic VTI:  0.20 m Systemic Diam: 2.50 cm  Lyman Bishop MD Electronically signed by Lyman Bishop MD Signature Date/Time: 08/30/2021/5:14:28 PM    Final              Patient Profile     57 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   Assessment & Plan    WPW with tachycardia requiring cardioversion due to chest pain and hypotension - Successful cardioversion. Appreciate ED. Was taking amiodarone 200 mg a day.  I increased her amiodarone to 200 mg twice a day as she continues to have episodes of arrhythmia.  This will help bridge her until she can have ablation performed by electrophysiology.   Elevated troponin, myocardial injury in the setting of tachycardia and DCCV - From [phone] high-sensitivity troponin.  This is secondary to both tachycardia as well as cardioversion.  This is not acute coronary syndrome.  No current chest pain.  She felt some mild tightness post cardioversion.  She did feel chest discomfort  however during her tachycardic episode.   Abnormal EKG - There was certainly some ST segment accentuation/depression noted immediately following cardioversion.  When looking at older EKG she does have a baseline subtle ST segment depression noted especially in the inferior leads.  This was likely stunning in the setting of cardioversion.  Recent cardiac catheterization as reviewed above showed no evidence of coronary disease.  Okay for discharge.  Has follow-up with EP.    For questions or updates, please contact [place] Please consult [email or web address] for contact info under        Signed, Candee Furbish, MD  09/13/2022, 6:53 AM

## 2022-09-23 NOTE — Progress Notes (Unsigned)
Cardiology Office Note Date:  09/23/2022  Patient ID:  [name], [name] 1966/06/06, MRN [id number] PCP:  Evans Lance, MD  Cardiologist:  *** Electrophysiologist: ***  ***refresh   Chief Complaint: ***  History of Present Illness: [name] is a 57 y.o. female with history of HTN, obesity, Afib    08/03/21 had an ER visit with sudden onset of palpitations and CP , SOB, lightheaded,  She was found in AFib w/RVR, treated with diltiazem gtt and had spontaneous conversion to SR HS Trop neg x2 Labs unremarkable Discharged from the ER to follow up with cardiology, revisit AFib management and a/c  Readmitted 08/30/21 again with RVR towards 200bpm, associated CP  *** ablation   AFib hx Diagnosed June 2008 (post op after a hysterectomy) converted with amio Again found June 2018   There was mention of possible WPW by EKG when 1st diagnosed with Afib Underwent EPS 02/24/2017 Pacing at 260 ms resulted in the induction of atrial fibrillation. While sedated, the patient's ventricular rate was nearly 200 beats a minute. There was variability in the QRS morphology, but there was no clear-cut evidence of accessory pathway conduction Conclusion: Invasive EP study with evidence of no accessory pathway conduction, there was inducible atrial fibrillation, and the patient was successfully return to sinus rhythm, with DC cardioversion   AAD Hx Amiodarone briefly in 2018 in-pt Flecainide started June 2018 >> stopped March 2024 with recurrent arrhythmia Amiodarone started March 2024  Past Medical History:  Diagnosis Date   Atrial fibrillation with rapid ventricular response ([place]) 12/24/2016   Atrial fibrillation with RVR ([place]) 12/26/2016   Chest pain 12/24/2016   Elevated troponin 12/25/2016   Hypokalemia 12/24/2016   Leukocytosis 12/24/2016   Sepsis ([place])    WPW (Wolff-Parkinson-White syndrome) 12/26/2016    Past Surgical History:  Procedure Laterality Date    ABDOMINAL HYSTERECTOMY     KNEE SURGERY     LEFT HEART CATH AND CORONARY ANGIOGRAPHY N/A 08/30/2021   Procedure: LEFT HEART CATH AND CORONARY ANGIOGRAPHY;  Surgeon: Early Osmond, MD;  Location: [facility];  Service: Cardiovascular;  Laterality: N/A;   V TACH ABLATION N/A 02/24/2017   Procedure: V Tach Ablation WPW Ablation;  Surgeon: Evans Lance, MD;  Location: [facility];  Service: Cardiovascular;  Laterality: N/A;    Current Outpatient Medications  Medication Sig Dispense Refill   amiodarone (PACERONE) 200 MG tablet Take 1 tablet (200 mg total) by mouth 2 (two) times daily. 60 tablet 1   aspirin EC 81 MG tablet Take 1 tablet (81 mg total) by mouth daily. Swallow whole. 90 tablet 3   metoprolol succinate (TOPROL-XL) 50 MG 24 hr tablet Take 1 tablet (50 mg total) by mouth in the morning and at bedtime. Take with or immediately following a meal. (Patient taking differently: Take 50 mg by mouth in the morning and at bedtime.) 90 tablet 7   nitroGLYCERIN (NITROSTAT) 0.4 MG SL tablet Take one tablet by mouth as needed for exertional chest pain discomfort.  May take 1 tablet by mouth daily. (Patient taking differently: Place 0.4 mg under the tongue every 5 (five) minutes as needed for chest pain.) 75 tablet 2   No current facility-administered medications for this visit.    Allergies:   Penicillins and Bee venom   Social History:  The patient  reports that she has never smoked. She has never used smokeless tobacco. She reports current alcohol use. She reports that she does  not use drugs.   Family History:  The patient's family history includes Diabetes Mellitus II in her mother; Heart attack in her father; Heart failure in her mother; Stroke in her father.  ROS:  Please see the history of present illness.    All other systems are reviewed and otherwise negative.   PHYSICAL EXAM:  VS:  There were no vitals taken for this visit. BMI: There is no height or weight on file to  calculate BMI. Well nourished, well developed, in no acute distress HEENT: normocephalic, atraumatic Neck: no JVD, carotid bruits or masses Cardiac:  *** RRR; no significant murmurs, no rubs, or gallops Lungs:  *** CTA b/l, no wheezing, rhonchi or rales Abd: soft, nontender MS: no deformity or *** atrophy Ext: *** no edema Skin: warm and dry, no rash Neuro:  No gross deficits appreciated Psych: euthymic mood, full affect     EKG:  Done today and reviewed by myself shows  ***   02/24/2017: EPS Invasive EP study with evidence of no accessory pathway conduction, there was inducible atrial fibrillation, and the patient was successfully return to sinus rhythm, with DC cardioversion.     01/22/2017: stress myoview Nuclear stress EF: 53%. There was no ST segment deviation noted during stress. There is a large defect of moderate severity present in the basal anteroseptal, mid anterior, mid anteroseptal, apical anterior and apical septal location. The defect is non-reversible. This is consistent with breast attenuation artifact. No ischemia noted. This is a low risk study. The left ventricular ejection fraction is mildly decreased (45-54%).   12/26/2016; TTE Study Conclusions  - Left ventricle: Wall thickness was increased in a pattern of    moderate LVH. Systolic function was normal. The estimated    ejection fraction was in the range of 60% to 65%. Left    ventricular diastolic function parameters were normal.  - Aortic valve: There was trivial regurgitation.  - Pulmonary arteries: PA peak pressure: 35 mm Hg (S).     Recent Labs: 08/13/2022: ALT 20 09/12/2022: BUN 19; Creatinine, Ser 0.90; Hemoglobin 14.2; Magnesium 1.9; Platelets 309; Potassium 3.5; Sodium 139; TSH 3.970  No results found for requested labs within last 365 days.   Estimated Creatinine Clearance: 98 mL/min (by C-G formula based on SCr of 0.9 mg/dL).   Wt Readings from Last 3 Encounters:  09/12/22 277 lb 5.4 oz  (125.8 kg)  08/13/22 279 lb (126.6 kg)  01/14/22 281 lb (127.5 kg)     Other studies reviewed: Additional studies/records reviewed today include: summarized above  ASSESSMENT AND PLAN:  ***  Disposition: F/u with ***  Current medicines are reviewed at length with the patient today.  The patient did not have any concerns regarding medicines.  Venetia Night, PA-C 09/23/2022 12:21 PM     [address] [place] [phone] (office)  [phone] (fax)

## 2022-09-26 ENCOUNTER — Ambulatory Visit: Payer: Self-pay | Attending: Physician Assistant | Admitting: Physician Assistant

## 2022-09-26 ENCOUNTER — Encounter: Payer: Self-pay | Admitting: Physician Assistant

## 2022-09-26 VITALS — BP 164/74 | HR 64 | Ht 69.0 in | Wt 281.0 lb

## 2022-09-26 DIAGNOSIS — I456 Pre-excitation syndrome: Secondary | ICD-10-CM

## 2022-09-26 DIAGNOSIS — I471 Supraventricular tachycardia, unspecified: Secondary | ICD-10-CM

## 2022-09-26 DIAGNOSIS — I480 Paroxysmal atrial fibrillation: Secondary | ICD-10-CM

## 2022-09-26 DIAGNOSIS — I10 Essential (primary) hypertension: Secondary | ICD-10-CM

## 2022-09-26 MED ORDER — AMIODARONE HCL 200 MG PO TABS: 200.0000 mg | ORAL_TABLET | Freq: Every day | ORAL | 1 refills | Status: DC

## 2022-09-26 NOTE — Patient Instructions (Addendum)
Medication Instructions:     START TAKING : AMIODARONE 200 MG ONCE A DAY  ON 10-13-22    *If you need a refill on your cardiac medications before your next appointment, please call your pharmacy*   Lab Work: LFT AND TSH TODAY    If you have labs (blood work) drawn today and your tests are completely normal, you will receive your results only by: [place] (if you have MyChart) OR A paper copy in the mail If you have any lab test that is abnormal or we need to change your treatment, we will call you to review the results.   Testing/Procedures: NONE ORDERED  TODAY    Follow-Up: At [facility], you and your health needs are our priority.  As part of our continuing mission to provide you with exceptional heart care, we have created designated Provider Care Teams.  These Care Teams include your primary Cardiologist (physician) and Advanced Practice Providers (APPs -  Physician Assistants and Nurse Practitioners) who all work together to provide you with the care you need, when you need it.  We recommend signing up for the patient portal called "MyChart".  Sign up information is provided on this After Visit Summary.  MyChart is used to connect with patients for Virtual Visits (Telemedicine).  Patients are able to view lab/test results, encounter notes, upcoming appointments, etc.  Non-urgent messages can be sent to your provider as well.   To learn more about what you can do with MyChart, go to [email or web address].    Your next appointment:    4 month(s) IN JULY NOT THE WEEK January 02 2023   Provider:   You may see Vickie Epley, MD or one of the following Advanced Practice Providers on your designated Care Team:     Other Instructions

## 2022-09-27 ENCOUNTER — Telehealth: Payer: Self-pay | Admitting: Licensed Clinical Social Worker

## 2022-09-27 ENCOUNTER — Telehealth (HOSPITAL_COMMUNITY): Payer: Self-pay | Admitting: Licensed Clinical Social Worker

## 2022-09-27 LAB — HEPATIC FUNCTION PANEL
ALT: 24 IU/L (ref 0–32)
AST: 27 IU/L (ref 0–40)
Albumin: 4.3 g/dL (ref 3.8–4.9)
Alkaline Phosphatase: 67 IU/L (ref 44–121)
Bilirubin Total: <0.2 mg/dL (ref 0.0–1.2)
Bilirubin, Direct: <0.1 mg/dL (ref 0.00–0.40)
Total Protein: 7.5 g/dL (ref 6.0–8.5)

## 2022-09-27 LAB — TSH: TSH: 2.41 u[IU]/mL (ref 0.450–4.500)

## 2022-09-27 NOTE — Telephone Encounter (Signed)
CSW received referral to assist patient with insurance as she is uninsured. CSW contacted patient by phone and left message for return call. Raquel Sarna, [place], [place]

## 2022-09-27 NOTE — Progress Notes (Addendum)
  Heart and Vascular Care Navigation  09/27/2022  [employer] [facility] September 30, 1965 [id number]  Reason for Referral: Lack of insurance   Engaged with patient by telephone for initial visit for Heart and Vascular Care Coordination.                                                                                                   Assessment:  CSW called pt to discuss above concerns.  Pt currently without insurance- employer does not offer insurance benefits and pt has looked at [employer] and it is cost prohibitive.  Pt makes about $3,610/month and spouse makes retirement income. They are over limits for Medicaid.  Pt applied for CAFA last year and was approved for 50% coverage - Which would provide an additional 50% whatever the patient owes after the self pay discount is applied.  Pt reports her income has not changed since that time so should still qualify for about 50% discount.  Pt currently private paying for medications which is between $50-100 a month- CSW provided with [place] [employer] application which would cover most of her medications if she meets income guidelines- depending on how much her spouse makes she might qualify.  CSW also discussed [employer] to help with PCP and specialist visit costs.  CSW emailed pt [employer], [employer], and [employer].  She will complete and turn in- will reach out to CSW for assistance if needed   Social History:                                                                             [place] Strain: Medium Risk (09/27/2022)  Tobacco Use: Low Risk  (09/26/2022)    SDOH Interventions: Financial Resources:  Museum/gallery curator Strain Interventions: Development worker, community, Other (Comment) Pt makes$3,610/month through full time employment and spouse gets Retirement income (unsure of amount)  Food Insecurity:  No concerns expressed  Housing Insecurity:  No concerns expressed  Transportation:   No concerns expressed     Follow-up plan:    Jorge Ny, [facility] Desk#: [phone] Cell#: [phone]

## 2022-10-14 ENCOUNTER — Telehealth (HOSPITAL_COMMUNITY): Payer: Self-pay | Admitting: Licensed Clinical Social Worker

## 2022-10-14 NOTE — Telephone Encounter (Signed)
H&V Care Navigation CSW Progress Note  Clinical Child psychotherapist received email from pt containing applications for CAFA and [place] [employer]   CSW reviewed applications and submitted to [employer] and [facility] for processing.  CSW sent message to pt provider to request prescriptions be sent to [facility] pharmacy.  SDOH Screenings   Financial Resource Strain: Medium Risk (10/14/2022)  Tobacco Use: Low Risk  (09/26/2022)    Burna Sis, LCSW Clinical Social Worker [facility] Desk#: [phone] Cell#: [phone]

## 2022-10-15 ENCOUNTER — Other Ambulatory Visit: Payer: Self-pay | Admitting: *Deleted

## 2022-10-15 MED ORDER — NITROGLYCERIN 0.4 MG SL SUBL: SUBLINGUAL_TABLET | SUBLINGUAL | 3 refills | Status: AC

## 2022-10-15 MED ORDER — AMIODARONE HCL 200 MG PO TABS: 200.0000 mg | ORAL_TABLET | Freq: Every day | ORAL | 3 refills | Status: DC

## 2022-10-15 MED ORDER — METOPROLOL SUCCINATE ER 50 MG PO TB24: 50.0000 mg | ORAL_TABLET | Freq: Two times a day (BID) | ORAL | 3 refills | Status: DC

## 2023-01-06 ENCOUNTER — Emergency Department (HOSPITAL_COMMUNITY): Payer: Self-pay

## 2023-01-06 ENCOUNTER — Other Ambulatory Visit: Payer: Self-pay

## 2023-01-06 ENCOUNTER — Emergency Department (HOSPITAL_COMMUNITY)
Admission: EM | Admit: 2023-01-06 | Discharge: 2023-01-06 | Disposition: A | Discharge status: Home / Self Care | Payer: Self-pay | Attending: Emergency Medicine | Admitting: Emergency Medicine

## 2023-01-06 ENCOUNTER — Encounter (HOSPITAL_COMMUNITY): Payer: Self-pay | Admitting: Emergency Medicine

## 2023-01-06 DIAGNOSIS — Z7982 Long term (current) use of aspirin: Secondary | ICD-10-CM | POA: Insufficient documentation

## 2023-01-06 DIAGNOSIS — I4891 Unspecified atrial fibrillation: Secondary | ICD-10-CM

## 2023-01-06 DIAGNOSIS — E876 Hypokalemia: Secondary | ICD-10-CM

## 2023-01-06 DIAGNOSIS — Z7901 Long term (current) use of anticoagulants: Secondary | ICD-10-CM | POA: Insufficient documentation

## 2023-01-06 DIAGNOSIS — G20A1 Parkinson's disease without dyskinesia, without mention of fluctuations: Secondary | ICD-10-CM | POA: Insufficient documentation

## 2023-01-06 LAB — MAGNESIUM: Magnesium: 2.2 mg/dL (ref 1.7–2.4)

## 2023-01-06 LAB — CBC WITH DIFFERENTIAL/PLATELET
Abs Immature Granulocytes: 0.06 10*3/uL (ref 0.00–0.07)
Basophils Absolute: 0.1 10*3/uL (ref 0.0–0.1)
Basophils Relative: 1 %
Eosinophils Absolute: 0.3 10*3/uL (ref 0.0–0.5)
Eosinophils Relative: 3 %
HCT: 42 % (ref 36.0–46.0)
Hemoglobin: 14 g/dL (ref 12.0–15.0)
Immature Granulocytes: 1 %
Lymphocytes Relative: 28 %
Lymphs Abs: 3.7 10*3/uL (ref 0.7–4.0)
MCH: 29.2 pg (ref 26.0–34.0)
MCHC: 33.3 g/dL (ref 30.0–36.0)
MCV: 87.7 fL (ref 80.0–100.0)
Monocytes Absolute: 1.2 10*3/uL — ABNORMAL HIGH (ref 0.1–1.0)
Monocytes Relative: 9 %
Neutro Abs: 7.6 10*3/uL (ref 1.7–7.7)
Neutrophils Relative %: 58 %
Platelets: 330 10*3/uL (ref 150–400)
RBC: 4.79 MIL/uL (ref 3.87–5.11)
RDW: 13.7 % (ref 11.5–15.5)
WBC: 12.8 10*3/uL — ABNORMAL HIGH (ref 4.0–10.5)
nRBC: 0 % (ref 0.0–0.2)

## 2023-01-06 LAB — COMPREHENSIVE METABOLIC PANEL
ALT: 24 U/L (ref 0–44)
AST: 26 U/L (ref 15–41)
Albumin: 4.2 g/dL (ref 3.5–5.0)
Alkaline Phosphatase: 49 U/L (ref 38–126)
Anion gap: 14 (ref 5–15)
BUN: 26 mg/dL — ABNORMAL HIGH (ref 6–20)
CO2: 22 mmol/L (ref 22–32)
Calcium: 9.7 mg/dL (ref 8.9–10.3)
Chloride: 100 mmol/L (ref 98–111)
Creatinine, Ser: 1.06 mg/dL — ABNORMAL HIGH (ref 0.44–1.00)
GFR, Estimated: >60 mL/min (ref >60)
Glucose, Bld: 142 mg/dL — ABNORMAL HIGH (ref 70–99)
Potassium: 2.9 mmol/L — ABNORMAL LOW (ref 3.5–5.1)
Sodium: 136 mmol/L (ref 135–145)
Total Bilirubin: 0.6 mg/dL (ref 0.3–1.2)
Total Protein: 8.7 g/dL — ABNORMAL HIGH (ref 6.5–8.1)

## 2023-01-06 MED ORDER — METOPROLOL TARTRATE 5 MG/5ML IV SOLN
5.0000 mg | Freq: Once | INTRAVENOUS | Status: AC
  Administered 2023-01-06: 5 mg via INTRAVENOUS
  Filled 2023-01-06: qty 5

## 2023-01-06 MED ORDER — SODIUM CHLORIDE 0.9 % IV SOLN: Freq: Once | INTRAVENOUS | Status: AC

## 2023-01-06 MED ORDER — POTASSIUM CHLORIDE 10 MEQ/100ML IV SOLN
10.0000 meq | Freq: Once | INTRAVENOUS | Status: AC
  Administered 2023-01-06: 10 meq via INTRAVENOUS
  Filled 2023-01-06: qty 100

## 2023-01-06 MED ORDER — APIXABAN 5 MG PO TABS: 5.0000 mg | ORAL_TABLET | Freq: Two times a day (BID) | ORAL | 0 refills | Status: DC

## 2023-01-06 MED ORDER — AMIODARONE HCL 200 MG PO TABS: 200.0000 mg | ORAL_TABLET | Freq: Two times a day (BID) | ORAL | 0 refills | Status: DC

## 2023-01-06 NOTE — Discharge Instructions (Signed)
I have spoken with the cardiology team, they have recommended the following medication changes  For the next 30 days please take Eliquis, 1 tablet twice a day, this is a blood thinner.  For the next 2 weeks take amiodarone 200 mg twice a day instead of once a day.  After the 2 weeks go back to once a day.  The cardiology team will be contacting you for a close follow-up.  I would recommend that you call in the morning as well to be seen by Dr. Ladona Ridgel and the electrophysiology team.  Return to the emergency department immediately for any severe or worsening symptoms

## 2023-01-06 NOTE — ED Provider Notes (Signed)
[name] EMERGENCY DEPARTMENT AT [facility] Provider Note   CSN: [id number] Arrival date & time: 01/06/23  1712     History  Chief Complaint  Patient presents with   Chest Pain    [name] is a 57 y.o. female.   Chest Pain This patient is a 57 year old female with a known history of atrial fibrillation with RVR, history of Wolff-Parkinson-White syndrome, had been admitted to the hospital with a tacky arrhythmia in March 2024 during which time she required cardioversion.  Has been placed on amiodarone, metoprolol and nitroglycerin, she does not take any anticoagulants.  Ultimately she had a electrophysiology study that had evidence of no accessory pathway conduction there was inducible atrial fibrillation and the patient returned to sinus rhythm with DC cardioversion during the admission to the hospital.  She has been on amiodarone since March 2024 she had a TTE study in March that showed an ejection fraction of 50 to 55%, normal right ventricular systolic function,  She reports that over the last 5 days she has had rather persistent symptoms of atrial fibrillation including some nausea some dizziness and weakness but it became much more severe today and she felt like she was going to syncopized while driving a car.  She arrives in A-fib with RVR with rates in the 150-180 range.  No fevers no vomiting no diarrhea     Home Medications Prior to Admission medications   Medication Sig Start Date End Date Taking? Authorizing Provider  amiodarone (PACERONE) 200 MG tablet Take 1 tablet (200 mg total) by mouth 2 (two) times daily for 14 days. 01/06/23 01/20/23 Yes Eber Hong, MD  apixaban (ELIQUIS) 5 MG TABS tablet Take 1 tablet (5 mg total) by mouth 2 (two) times daily. 01/06/23 02/05/23 Yes Eber Hong, MD  amiodarone (PACERONE) 200 MG tablet Take 1 tablet (200 mg total) by mouth daily. 10/15/22   Sheilah Pigeon, PA-C  aspirin EC 81 MG tablet Take 1 tablet (81 mg total)  by mouth daily. Swallow whole. 09/26/21   Lanier Prude, MD  metoprolol succinate (TOPROL-XL) 50 MG 24 hr tablet Take 1 tablet (50 mg total) by mouth in the morning and at bedtime. Take with or immediately following a meal. 10/15/22   Sheilah Pigeon, PA-C  nitroGLYCERIN (NITROSTAT) 0.4 MG SL tablet Take one tablet by mouth as needed for exertional chest pain discomfort.  May take 1 tablet by mouth daily. 10/15/22   Sheilah Pigeon, PA-C      Allergies    Penicillins and Bee venom    Review of Systems   Review of Systems  Cardiovascular:  Positive for chest pain.  All other systems reviewed and are negative.   Physical Exam Updated Vital Signs BP 120/67 (BP Location: Right Arm)   Pulse 71   Temp 97.9 F (36.6 C) (Oral)   Resp (!) 22   Ht 1.753 m (5\' 9" )   Wt 122.5 kg   SpO2 95%   BMI 39.87 kg/m  Physical Exam Vitals and nursing note reviewed.  Constitutional:      General: She is not in acute distress.    Appearance: She is well-developed. She is ill-appearing.  HENT:     Head: Normocephalic and atraumatic.     Mouth/Throat:     Pharynx: No oropharyngeal exudate.  Eyes:     General: No scleral icterus.       Right eye: No discharge.        Left eye:  No discharge.     Conjunctiva/sclera: Conjunctivae normal.     Pupils: Pupils are equal, round, and reactive to light.  Neck:     Thyroid: No thyromegaly.     Vascular: No JVD.  Cardiovascular:     Rate and Rhythm: Tachycardia present. Rhythm irregular.     Heart sounds: Normal heart sounds. No murmur heard.    No friction rub. No gallop.  Pulmonary:     Effort: Pulmonary effort is normal. No respiratory distress.     Breath sounds: Normal breath sounds. No wheezing or rales.  Abdominal:     General: Bowel sounds are normal. There is no distension.     Palpations: Abdomen is soft. There is no mass.     Tenderness: There is no abdominal tenderness.  Musculoskeletal:        General: No tenderness. Normal range  of motion.     Cervical back: Normal range of motion and neck supple.     Right lower leg: No edema.     Left lower leg: No edema.  Lymphadenopathy:     Cervical: No cervical adenopathy.  Skin:    General: Skin is warm and dry.     Findings: No erythema or rash.  Neurological:     Mental Status: She is alert.     Coordination: Coordination normal.  Psychiatric:        Behavior: Behavior normal.     ED Results / Procedures / Treatments   Labs (all labs ordered are listed, but only abnormal results are displayed) Labs Reviewed  CBC WITH DIFFERENTIAL/PLATELET - Abnormal; Notable for the following components:      Result Value   WBC 12.8 (*)    Monocytes Absolute 1.2 (*)    All other components within normal limits  COMPREHENSIVE METABOLIC PANEL - Abnormal; Notable for the following components:   Potassium 2.9 (*)    Glucose, Bld 142 (*)    BUN 26 (*)    Creatinine, Ser 1.06 (*)    Total Protein 8.7 (*)    All other components within normal limits  MAGNESIUM    EKG EKG Interpretation Date/Time:  Monday January 06 2023 18:35:51 EDT Ventricular Rate:  70 PR Interval:  159 QRS Duration:  109 QT Interval:  444 QTC Calculation: 480 R Axis:   55  Text Interpretation: Sinus rhythm LAE, consider biatrial enlargement Minimal ST depression, inferior leads Since last tracing afib replaced with NSR Confirmed by Eber Hong ([id number]) on 01/06/2023 7:53:54 PM  Radiology DG Chest Port 1 View  Result Date: 01/06/2023 CLINICAL DATA:  Chest pain. EXAM: PORTABLE CHEST 1 VIEW COMPARISON:  August 30, 2021. FINDINGS: Stable cardiomediastinal silhouette. Both lungs are clear. The visualized skeletal structures are unremarkable. IMPRESSION: No active disease. Electronically Signed   By: Lupita Raider M.D.   On: 01/06/2023 18:26    Procedures Procedures    Medications Ordered in ED Medications  potassium chloride 10 mEq in 100 mL IVPB (10 mEq Intravenous New Bag/Given 01/06/23 1948)   metoprolol tartrate (LOPRESSOR) injection 5 mg (5 mg Intravenous Given 01/06/23 1752)  0.9 %  sodium chloride infusion ( Intravenous New Bag/Given 01/06/23 1947)    ED Course/ Medical Decision Making/ A&P                             Medical Decision Making Amount and/or Complexity of Data Reviewed Labs: ordered. Radiology: ordered. ECG/medicine tests: ordered.  Risk  Prescription drug management.    This patient presents to the ED for concern of A-fib with RVR and Tachyarrhythmia, this involves an extensive number of treatment options, and is a complaint that carries with it a high risk of complications and morbidity.  The differential diagnosis includes fib, SVT, V-fib, V. tach   Co morbidities that complicate the patient evaluation  Possible history of WPW though 2018 EP study suggest this was not the case   Additional history obtained:  Additional history obtained from electronic medical record External records from outside source obtained and reviewed including recently heart cath and echocardiogram which were reassuring from March   Lab Tests:  I Ordered, and personally interpreted labs.  The pertinent results include: Hypokalemia   Imaging Studies ordered:  I ordered imaging studies including chest x-ray I independently visualized and interpreted imaging which showed no acute findings I agree with the radiologist interpretation   Cardiac Monitoring: / EKG:  The patient was maintained on a cardiac monitor.  I personally viewed and interpreted the cardiac monitored which showed an underlying rhythm of: Atrial fibrillation with rapid ventricular rate   Consultations Obtained:  I requested consultation with the cardiologist Dr. Antoine Poche who recommended admission to the hospital with a Cardizem drip,  and discussed lab and imaging findings as well as pertinent plan - they recommend: As above. After administering 5 mg of Lopressor IV a short time later the patient  converted back to normal sinus rhythm. She remained in normal sinus rhythm throughout the rest of her emergency department stay.  She got IV potassium.  I discussed the case with Dr. Anne Fu the evening cardiologist who recommends the patient cannot be discharged now that she has more stable on 1 month of Eliquis twice daily and amiodarone 200 mg twice a day for 2 weeks then back to once a day. The patient was formed of these results and recommendations for treatment and is in total agreement.  She remains hemodynamically stable with a heart rate of 70 and a repeat EKG which is unremarkable and shows normal sinus rhythm with no signs of WPW.   Problem List / ED Course / Critical interventions / Medication management  I have reviewed the patients home medicines and have made adjustments as needed   Social Determinants of Health:  None   Test / Admission - Considered:  Considered admission but the patient converted and has become stable for discharge        Final Clinical Impression(s) / ED Diagnoses Final diagnoses:  Atrial fibrillation with rapid ventricular response (HCC)  Hypokalemia    Rx / DC Orders ED Discharge Orders          Ordered    amiodarone (PACERONE) 200 MG tablet  2 times daily        01/06/23 2029    apixaban (ELIQUIS) 5 MG TABS tablet  2 times daily        01/06/23 2029              Eber Hong, MD 01/06/23 2033

## 2023-01-06 NOTE — ED Notes (Signed)
Hourly rounding Noted that pt converted 12 lead repeated Pt complains of increased Chest pressure.  Vitals listed

## 2023-01-06 NOTE — ED Notes (Signed)
Medicated per [facility]  Pt stated that she has had palpitations since Tuesday  Took extra dosage of medications Increase tiredness. Unable to get out os bed has been sleeping Today went to work and feels tightness in throat, took 2 NTG Increased feeling of hot and sweating.   Came to ED

## 2023-01-06 NOTE — ED Triage Notes (Signed)
Pt w/ hx of afib. Pt c/o of chest pain x 5 days. Pt HR 220 in triage.

## 2023-01-06 NOTE — ED Notes (Signed)
Patient verbalizes understanding of discharge instructions. Opportunity for questioning and answers were provided. Pt discharged from ED to home via POV. Pt aware of warning signs to return since she is starting eliquis.

## 2023-01-07 ENCOUNTER — Ambulatory Visit: Payer: Self-pay | Attending: Cardiology | Admitting: Cardiology

## 2023-01-07 ENCOUNTER — Encounter: Payer: Self-pay | Admitting: Cardiology

## 2023-01-07 VITALS — BP 104/66 | HR 63 | Ht 69.0 in | Wt 273.6 lb

## 2023-01-07 DIAGNOSIS — I480 Paroxysmal atrial fibrillation: Secondary | ICD-10-CM

## 2023-01-07 DIAGNOSIS — I471 Supraventricular tachycardia, unspecified: Secondary | ICD-10-CM

## 2023-01-07 DIAGNOSIS — I10 Essential (primary) hypertension: Secondary | ICD-10-CM

## 2023-01-07 MED ORDER — AMIODARONE HCL 200 MG PO TABS: 200.0000 mg | ORAL_TABLET | Freq: Two times a day (BID) | ORAL | 3 refills | Status: DC

## 2023-01-07 NOTE — Patient Instructions (Signed)
Medication Instructions:  Increase Amiodarone to 200 mg twice a day Continue all other medications *If you need a refill on your cardiac medications before your next appointment, please call your pharmacy*   Lab Work: Bmet today   Testing/Procedures: None ordered   Follow-Up: At [facility], you and your health needs are our priority.  As part of our continuing mission to provide you with exceptional heart care, we have created designated Provider Care Teams.  These Care Teams include your primary Cardiologist (physician) and Advanced Practice Providers (APPs -  Physician Assistants and Nurse Practitioners) who all work together to provide you with the care you need, when you need it.  We recommend signing up for the patient portal called "MyChart".  Sign up information is provided on this After Visit Summary.  MyChart is used to connect with patients for Virtual Visits (Telemedicine).  Patients are able to view lab/test results, encounter notes, upcoming appointments, etc.  Non-urgent messages can be sent to your provider as well.   To learn more about what you can do with MyChart, go to [email or web address].    Your next appointment:  Thursday 8/1 at 9:15 am    Provider:  Terressa Koyanagi

## 2023-01-07 NOTE — Progress Notes (Signed)
Cardiology Office Note:  .   Date:  01/07/2023  ID:  [name], DOB Mar 19, 1966, MRN [id number] PCP: Marinus Maw, MD  [facility] Providers Cardiologist:  None Electrophysiologist:  Lanier Prude, MD     History of Present Illness: Marland Kitchen   [name] is a 57 y.o. female seen as a work in for Afib. Seen in ED yesterday with Afib RVR. Converted to NSR. Is followed by EP service. On chronic amiodarone. Prior cardiac cath in March 2023 normal. Echo showed mild LVH then. Otherwise OK. ? History of WPW but EP study  in 2018 showed no accessory pathway. She was Admitted 09/12/22 with SVT,  DCCV in the ER post CV with some concerns of ST changes and HS trop from 4 > 57, cardiology consulted. Amiodarone increased to 200mg  BID, not felt to have ACS/AMI, EKG changes/Trops felt 2/2 DCCV/tachycardia. Discharged 09/13/22. When seen back in EP clinic continued same therapy. Considered for ablation which patient would like to have but unable to afford given lack of insurance. Was seen in ED yesterday with 5 day history of palpitations associated with fatigue, chest tightness and feeling like she might pass out while driving. Was in Afib with RVR rate 150-180 bpm. Potassium low 2.9. other labs normal. She did convert to NSR in ED. Given IV potassium.   She states she is better today but still weak.    ROS: see above  Studies Reviewed: Marland Kitchen   EKG Interpretation Date/Time:  Tuesday January 07 2023 11:26:41 EDT Ventricular Rate:  63 PR Interval:  142 QRS Duration:  100 QT Interval:  460 QTC Calculation: 470 R Axis:   29  Text Interpretation: Normal sinus rhythm Possible Left atrial enlargement Minimal voltage criteria for LVH, may be normal variant ( Cornell product ) When compared with ECG of 06-Jan-2023 18:35, No significant change since last tracing Confirmed by [place], Syniah Berne [phone]) on 01/07/2023 11:28:46 AM See HPI  EKG Interpretation Date/Time:  Tuesday January 07 2023 11:26:41  EDT Ventricular Rate:  63 PR Interval:  142 QRS Duration:  100 QT Interval:  460 QTC Calculation: 470 R Axis:   29  Text Interpretation: Normal sinus rhythm Possible Left atrial enlargement Minimal voltage criteria for LVH, may be normal variant ( Cornell product ) When compared with ECG of 06-Jan-2023 18:35, No significant change since last tracing Confirmed by [place], Nicholous Girgenti [phone]) on 01/07/2023 11:28:46 AM     Risk Assessment/Calculations:    CHA2DS2-VASc Score = 1   This indicates a 0.6% annual risk of stroke. The patient's score is based upon: CHF History: 0 HTN History: 0 Diabetes History: 0 Stroke History: 0 Vascular Disease History: 0 Age Score: 0 Gender Score: 1             Physical Exam:   VS:  BP 104/66   Pulse 63   Ht 5\' 9"  (1.753 m)   Wt 273 lb 9.6 oz (124.1 kg)   SpO2 96%   BMI 40.40 kg/m    Wt Readings from Last 3 Encounters:  01/07/23 273 lb 9.6 oz (124.1 kg)  01/06/23 270 lb (122.5 kg)  09/26/22 281 lb (127.5 kg)    GEN: Well nourished, well developed in no acute distress NECK: No JVD; No carotid bruits CARDIAC: RRR, no murmurs, rubs, gallops RESPIRATORY:  Clear to auscultation without rales, wheezing or rhonchi  ABDOMEN: Soft, non-tender, non-distended EXTREMITIES:  No edema; No deformity   ASSESSMENT AND PLAN: .    Paroxysmal Afib with  RVR   Will increase amiodarone to 200 mg bid  Recheck potassium today.  Continue Toprol XL 50 mg bid.  Will discontinue Eliquis. Low [place] score and she can't afford anyway Continue ASA 81 mg daily  Has follow up with Dr Lalla Brothers on August 1      Signed, Katniss Weedman [place], MD

## 2023-01-07 NOTE — Telephone Encounter (Signed)
Called pt in regards to my chart message.  Reports feels as if heart is quivering and something just doesn't feel right. Advised pt to return to ED for evaluation.  Pt reports does not have insurance and does not want to keep going to ED.  Advised pt if feels bad to seek urgent care.  Pt reports would rather be seen in office.  Does not have recent BP reading or HR.  Has taken metoprolol , amiodarone and aspirin. Has not taken Eliquis reports pharmacy is not open yet.  Advised pt it's very important to take medication.   Scheduled DOD OV with Dr. [place] at 11:30 am.  Advised of location and to get samples and 30 day free trial card while at visit.  Also, advised pt if symptoms worsen to go to ED for evaluation.

## 2023-01-08 LAB — BASIC METABOLIC PANEL
BUN/Creatinine Ratio: 24 — ABNORMAL HIGH (ref 9–23)
BUN: 21 mg/dL (ref 6–24)
CO2: 25 mmol/L (ref 20–29)
Calcium: 9.9 mg/dL (ref 8.7–10.2)
Chloride: 100 mmol/L (ref 96–106)
Creatinine, Ser: 0.87 mg/dL (ref 0.57–1.00)
Glucose: 121 mg/dL — ABNORMAL HIGH (ref 70–99)
Potassium: 4.1 mmol/L (ref 3.5–5.2)
Sodium: 142 mmol/L (ref 134–144)
eGFR: 77 mL/min/{1.73_m2} (ref >59)

## 2023-01-30 ENCOUNTER — Encounter: Payer: Self-pay | Admitting: Cardiology

## 2023-01-30 ENCOUNTER — Ambulatory Visit: Payer: Self-pay | Attending: Cardiology | Admitting: Cardiology

## 2023-01-30 VITALS — BP 144/76 | HR 55 | Ht 69.0 in | Wt 279.6 lb

## 2023-01-30 DIAGNOSIS — I480 Paroxysmal atrial fibrillation: Secondary | ICD-10-CM

## 2023-01-30 DIAGNOSIS — I471 Supraventricular tachycardia, unspecified: Secondary | ICD-10-CM

## 2023-01-30 MED ORDER — METOPROLOL SUCCINATE ER 50 MG PO TB24: 50.0000 mg | ORAL_TABLET | Freq: Two times a day (BID) | ORAL | 3 refills | Status: DC

## 2023-01-30 MED ORDER — AMIODARONE HCL 200 MG PO TABS: 200.0000 mg | ORAL_TABLET | Freq: Every day | ORAL | 3 refills | Status: DC

## 2023-01-30 MED ORDER — METOPROLOL TARTRATE 25 MG PO TABS: ORAL_TABLET | ORAL | 3 refills | Status: DC

## 2023-01-30 NOTE — Progress Notes (Signed)
Electrophysiology Office Follow up Visit Note:    Date:  01/30/2023   ID:  [name], DOB Feb 16, 1966, MRN [id number]  PCP:  Marinus Maw, MD  [facility] Cardiologist:  None  [facility] Electrophysiologist:  Lanier Prude, MD    Interval History:    [name] is a 57 y.o. female who presents for a follow up visit.   Last seen in clinic by [facility] on September 26, 2022.  She did have an ER visit on January 06, 2023.  She has been admitted multiple times to the hospital with tachycardias.  She is uninsured and has not pursued catheter ablation in the past for this reason.  She presented to the ER on January 06, 2023 with 5 days of symptomatic atrial fibrillation.  When she arrived in the ER her heart rates were in the 150s to 180s.  She was symptomatic.  The patient converted back to sinus rhythm in the emergency department she was discharged from the ER.  She followed up with Dr. [place]iland on July 9.  She has been maintained on aspirin 81 mg by mouth once daily.  She is doing well.  She tells me that her atrial fibrillation started in early July when she was in the garden.  She was not dehydrated.    Past medical, surgical, social and family history were reviewed.  ROS:   Please see the history of present illness.    All other systems reviewed and are negative.  EKGs/Labs/Other Studies Reviewed:    The following studies were reviewed today:    EKG Interpretation Date/Time:  Thursday January 30 2023 08:56:05 EDT Ventricular Rate:  55 PR Interval:  162 QRS Duration:  96 QT Interval:  486 QTC Calculation: 464 R Axis:   32  Text Interpretation: Sinus bradycardia Confirmed by Steffanie Dunn [phone]) on 01/30/2023 9:01:10 AM    Physical Exam:    VS:  BP (!) 144/76   Pulse (!) 55   Ht 5\' 9"  (1.753 m)   Wt 279 lb 9.6 oz (126.8 kg)   SpO2 96%   BMI 41.29 kg/m     Wt Readings from Last 3 Encounters:  01/30/23 279 lb 9.6 oz (126.8 kg)  01/07/23 273 lb 9.6 oz  (124.1 kg)  01/06/23 270 lb (122.5 kg)     GEN:  Well nourished, well developed in no acute distress.  Obese CARDIAC: RRR, no murmurs, rubs, gallops RESPIRATORY:  Clear to auscultation without rales, wheezing or rhonchi       ASSESSMENT:    1. Paroxysmal atrial fibrillation (HCC)   2. SVT (supraventricular tachycardia)    PLAN:    In order of problems listed above:  #Persistent atrial fibrillation Recurrent, symptomatic.  Has done much better on amiodarone but continues to have rare breakthrough episodes.  Most recently was in the setting of hypokalemia.  CHA2DS2-VASc Score = 1  The patient's score is based upon: CHF History: 0 HTN History: 0 Diabetes History: 0 Stroke History: 0 Vascular Disease History: 0 Age Score: 0 Gender Score: 1  Continue aspirin 81 mg by mouth once daily given low CHA2DS2-VASc.  Continue amiodarone 200 mg by mouth but decrease to once daily.  Recent blood work showed stable liver and thyroid function.  Add metoprolol tartrate 25 mg by mouth every 4 hours as needed for sustained heart rates greater than 100 bpm.  Follow-up 6 months with APP.    Signed, Steffanie Dunn, MD, [facility], [facility] 01/30/2023 9:02 AM  Electrophysiology [facility]

## 2023-01-30 NOTE — Patient Instructions (Signed)
Medication Instructions:  Your physician has recommended you make the following change in your medication:  1) DECREASE amiodarone to 200 mg once daily 2) START taking metoprolol tartrate 25 mg every 4 hours as needed for sustained heart rates greater than 100bpm *If you need a refill on your cardiac medications before your next appointment, please call your pharmacy*  Follow-Up: At [facility], you and your health needs are our priority.  As part of our continuing mission to provide you with exceptional heart care, we have created designated Provider Care Teams.  These Care Teams include your primary Cardiologist (physician) and Advanced Practice Providers (APPs -  Physician Assistants and Nurse Practitioners) who all work together to provide you with the care you need, when you need it.  Your next appointment:   6 month(s)  Provider:   You may see Lanier Prude, MD or one of the following Advanced Practice Providers on your designated Care Team:   Francis Dowse, [place] [address]" [place], [place] Sherie Don, NP Canary Brim, NP

## 2023-01-30 NOTE — Addendum Note (Signed)
Addended by: Frutoso Schatz on: 01/30/2023 10:19 AM   Modules accepted: Orders

## 2023-02-01 ENCOUNTER — Other Ambulatory Visit: Payer: Self-pay | Admitting: Internal Medicine

## 2023-03-28 ENCOUNTER — Encounter (HOSPITAL_COMMUNITY): Payer: Self-pay

## 2023-03-28 ENCOUNTER — Inpatient Hospital Stay (HOSPITAL_COMMUNITY)
Admission: EM | Admit: 2023-03-28 | Discharge: 2023-03-31 | DRG: 309 | Disposition: A | Discharge status: Home / Self Care | Payer: Self-pay | Attending: Internal Medicine | Admitting: Internal Medicine

## 2023-03-28 ENCOUNTER — Emergency Department (HOSPITAL_COMMUNITY): Payer: Self-pay

## 2023-03-28 ENCOUNTER — Other Ambulatory Visit: Payer: Self-pay

## 2023-03-28 DIAGNOSIS — I471 Supraventricular tachycardia, unspecified: Principal | ICD-10-CM

## 2023-03-28 DIAGNOSIS — Z79899 Other long term (current) drug therapy: Secondary | ICD-10-CM

## 2023-03-28 DIAGNOSIS — R400 Somnolence: Secondary | ICD-10-CM | POA: Diagnosis not present

## 2023-03-28 DIAGNOSIS — R110 Nausea: Secondary | ICD-10-CM | POA: Diagnosis not present

## 2023-03-28 DIAGNOSIS — Z8249 Family history of ischemic heart disease and other diseases of the circulatory system: Secondary | ICD-10-CM

## 2023-03-28 DIAGNOSIS — D72829 Elevated white blood cell count, unspecified: Secondary | ICD-10-CM | POA: Diagnosis present

## 2023-03-28 DIAGNOSIS — I4892 Unspecified atrial flutter: Secondary | ICD-10-CM | POA: Diagnosis present

## 2023-03-28 DIAGNOSIS — I959 Hypotension, unspecified: Secondary | ICD-10-CM | POA: Diagnosis present

## 2023-03-28 DIAGNOSIS — Z833 Family history of diabetes mellitus: Secondary | ICD-10-CM

## 2023-03-28 DIAGNOSIS — R001 Bradycardia, unspecified: Secondary | ICD-10-CM | POA: Diagnosis present

## 2023-03-28 DIAGNOSIS — I4819 Other persistent atrial fibrillation: Principal | ICD-10-CM | POA: Diagnosis present

## 2023-03-28 DIAGNOSIS — Z880 Allergy status to penicillin: Secondary | ICD-10-CM

## 2023-03-28 DIAGNOSIS — I456 Pre-excitation syndrome: Secondary | ICD-10-CM | POA: Diagnosis present

## 2023-03-28 DIAGNOSIS — I4719 Other supraventricular tachycardia: Secondary | ICD-10-CM | POA: Diagnosis present

## 2023-03-28 DIAGNOSIS — Z5986 Financial insecurity: Secondary | ICD-10-CM

## 2023-03-28 DIAGNOSIS — Z90710 Acquired absence of both cervix and uterus: Secondary | ICD-10-CM

## 2023-03-28 DIAGNOSIS — Z823 Family history of stroke: Secondary | ICD-10-CM

## 2023-03-28 DIAGNOSIS — Z6841 Body Mass Index (BMI) 40.0 and over, adult: Secondary | ICD-10-CM

## 2023-03-28 DIAGNOSIS — E669 Obesity, unspecified: Secondary | ICD-10-CM | POA: Insufficient documentation

## 2023-03-28 DIAGNOSIS — E876 Hypokalemia: Secondary | ICD-10-CM | POA: Diagnosis present

## 2023-03-28 DIAGNOSIS — Z91030 Bee allergy status: Secondary | ICD-10-CM

## 2023-03-28 DIAGNOSIS — R7989 Other specified abnormal findings of blood chemistry: Secondary | ICD-10-CM | POA: Diagnosis present

## 2023-03-28 DIAGNOSIS — I483 Typical atrial flutter: Secondary | ICD-10-CM | POA: Diagnosis present

## 2023-03-28 DIAGNOSIS — I2489 Other forms of acute ischemic heart disease: Secondary | ICD-10-CM | POA: Diagnosis present

## 2023-03-28 DIAGNOSIS — Z597 Insufficient social insurance and welfare support: Secondary | ICD-10-CM

## 2023-03-28 DIAGNOSIS — Z7982 Long term (current) use of aspirin: Secondary | ICD-10-CM

## 2023-03-28 LAB — BASIC METABOLIC PANEL
Anion gap: 7 (ref 5–15)
BUN: 19 mg/dL (ref 6–20)
CO2: 27 mmol/L (ref 22–32)
Calcium: 9.1 mg/dL (ref 8.9–10.3)
Chloride: 104 mmol/L (ref 98–111)
Creatinine, Ser: 0.97 mg/dL (ref 0.44–1.00)
GFR, Estimated: >60 mL/min (ref >60)
Glucose, Bld: 98 mg/dL (ref 70–99)
Potassium: 3.9 mmol/L (ref 3.5–5.1)
Sodium: 138 mmol/L (ref 135–145)

## 2023-03-28 LAB — CBC
HCT: 40 % (ref 36.0–46.0)
Hemoglobin: 12.8 g/dL (ref 12.0–15.0)
MCH: 29.2 pg (ref 26.0–34.0)
MCHC: 32 g/dL (ref 30.0–36.0)
MCV: 91.1 fL (ref 80.0–100.0)
Platelets: 249 10*3/uL (ref 150–400)
RBC: 4.39 MIL/uL (ref 3.87–5.11)
RDW: 14.4 % (ref 11.5–15.5)
WBC: 13.5 10*3/uL — ABNORMAL HIGH (ref 4.0–10.5)
nRBC: 0 % (ref 0.0–0.2)

## 2023-03-28 LAB — TROPONIN I (HIGH SENSITIVITY)
Troponin I (High Sensitivity): 38 ng/L — ABNORMAL HIGH (ref <18)
Troponin I (High Sensitivity): 53 ng/L — ABNORMAL HIGH (ref <18)

## 2023-03-28 MED ORDER — METOPROLOL TARTRATE 5 MG/5ML IV SOLN
5.0000 mg | Freq: Once | INTRAVENOUS | Status: AC
  Administered 2023-03-28: 5 mg via INTRAVENOUS
  Filled 2023-03-28: qty 5

## 2023-03-28 MED ORDER — ONDANSETRON HCL 4 MG/2ML IJ SOLN
4.0000 mg | Freq: Once | INTRAMUSCULAR | Status: AC
  Administered 2023-03-28: 4 mg via INTRAVENOUS
  Filled 2023-03-28: qty 2

## 2023-03-28 MED ORDER — ADENOSINE 6 MG/2ML IV SOLN
6.0000 mg | Freq: Once | INTRAVENOUS | Status: AC
  Administered 2023-03-28: 6 mg via INTRAVENOUS
  Filled 2023-03-28: qty 2

## 2023-03-28 MED ORDER — MORPHINE SULFATE (PF) 2 MG/ML IV SOLN
2.0000 mg | Freq: Once | INTRAVENOUS | Status: AC
  Administered 2023-03-28: 2 mg via INTRAVENOUS
  Filled 2023-03-28: qty 1

## 2023-03-28 NOTE — Progress Notes (Signed)
RT present for administration of adenosine and possible cardioversion. Patient did not require cardioversion post adenosine. RT placed patient on 2 lpm nasal cannula with ETCO2 monitoring prior to procedure. Patient VSS throughout and no adverse events noted.

## 2023-03-28 NOTE — ED Notes (Signed)
[facility] repaged Cards to Dr Deretha Emory

## 2023-03-28 NOTE — ED Triage Notes (Signed)
Pt arrives from Home via EMS with CP starting at 4 pm. Pt was mopping when it started at work. Pt took 3 nitroglycerin PTA and aspirin and metoprolol. Did reports nausea, diaphoresis with episode. HR with EMS 135 BP 126/82, 20 g Left AC

## 2023-03-28 NOTE — ED Notes (Signed)
Cardiology paged to Dr Deretha Emory @ [phone]

## 2023-03-28 NOTE — ED Provider Notes (Addendum)
[place] EMERGENCY DEPARTMENT AT [facility] Provider Note   CSN: [id number] Arrival date & time: 03/28/23  1848     History  Chief Complaint  Patient presents with   Chest Pain    [name] is a 57 y.o. female.  Patient with a known history of tacky arrhythmias.  Also patient does have with Parkinson White.  Patient is followed by cardiology.  Patient currently is taking adenosine.  And she is taking Toprol XL 50 mg in the morning and at bedtime and then supposed to take 25 mg every 4 hours to help control the heart.  At approximately 1600 patient felt like her heart was going fast.  Has some comfort in the chest that was more like pressure.  No nausea vomiting.  Patient felt fine earlier in the day.  Patient had ablation in the past that did not work.  Patient at times has responded to just beta-blockers that I converted her.  Another time she came in with a heart rate in the 200s and required emergent cardioversion.  And then was admitted.  This situation heart rates been pretty steady at kind of 1 10-1 12.  Discussed with on-call cardiology.  They recommend trying adenosine with pads on because she could go into a fast rhythm if it does not work to use beta-blockers.  She would require admission down to [facility].       Home Medications Prior to Admission medications   Medication Sig Start Date End Date Taking? Authorizing Provider  amiodarone (PACERONE) 200 MG tablet Take 1 tablet (200 mg total) by mouth daily. Patient taking differently: Take 200 mg by mouth at bedtime. 01/30/23  Yes Lanier Prude, MD  aspirin EC 81 MG tablet Take 1 tablet (81 mg total) by mouth daily. Swallow whole. 09/26/21  Yes Lanier Prude, MD  metoprolol succinate (TOPROL-XL) 50 MG 24 hr tablet Take 1 tablet (50 mg total) by mouth in the morning and at bedtime. Take with or immediately following a meal. 01/30/23  Yes Lanier Prude, MD  metoprolol tartrate (LOPRESSOR) 25 MG tablet  Take 1 tablet (25 mg) every 4 hours as needed for sustained heart rates greater than 100bpm 01/30/23  Yes Lanier Prude, MD  nitroGLYCERIN (NITROSTAT) 0.4 MG SL tablet Take one tablet by mouth as needed for exertional chest pain discomfort.  May take 1 tablet by mouth daily. 10/15/22  Yes Sheilah Pigeon, PA-C      Allergies    Penicillins and Bee venom    Review of Systems   Review of Systems  Constitutional:  Negative for chills and fever.  HENT:  Negative for ear pain and sore throat.   Eyes:  Negative for pain and visual disturbance.  Respiratory:  Negative for cough and shortness of breath.   Cardiovascular:  Positive for chest pain and palpitations.  Gastrointestinal:  Negative for abdominal pain and vomiting.  Genitourinary:  Negative for dysuria and hematuria.  Musculoskeletal:  Negative for arthralgias and back pain.  Skin:  Negative for color change and rash.  Neurological:  Negative for seizures and syncope.  All other systems reviewed and are negative.   Physical Exam Updated Vital Signs BP (!) 131/96   Pulse (!) 110   Temp 98.1 F (36.7 C) (Oral)   Resp 15   Ht 1.753 m (5\' 9" )   Wt 122.5 kg   SpO2 97%   BMI 39.87 kg/m  Physical Exam Vitals and nursing note reviewed.  Constitutional:      General: She is not in acute distress.    Appearance: Normal appearance. She is well-developed.  HENT:     Head: Normocephalic and atraumatic.  Eyes:     Conjunctiva/sclera: Conjunctivae normal.     Pupils: Pupils are equal, round, and reactive to light.  Cardiovascular:     Rate and Rhythm: Regular rhythm. Tachycardia present.     Heart sounds: No murmur heard. Pulmonary:     Effort: Pulmonary effort is normal. No respiratory distress.     Breath sounds: Normal breath sounds.  Abdominal:     Palpations: Abdomen is soft.     Tenderness: There is no abdominal tenderness.  Musculoskeletal:        General: No swelling.     Cervical back: Neck supple.  Skin:     General: Skin is warm and dry.     Capillary Refill: Capillary refill takes less than 2 seconds.  Neurological:     General: No focal deficit present.     Mental Status: She is alert and oriented to person, place, and time.  Psychiatric:        Mood and Affect: Mood normal.     ED Results / Procedures / Treatments   Labs (all labs ordered are listed, but only abnormal results are displayed) Labs Reviewed  CBC - Abnormal; Notable for the following components:      Result Value   WBC 13.5 (*)    All other components within normal limits  TROPONIN I (HIGH SENSITIVITY) - Abnormal; Notable for the following components:   Troponin I (High Sensitivity) 38 (*)    All other components within normal limits  TROPONIN I (HIGH SENSITIVITY) - Abnormal; Notable for the following components:   Troponin I (High Sensitivity) 53 (*)    All other components within normal limits  BASIC METABOLIC PANEL    EKG EKG Interpretation Date/Time:  Friday March 28 2023 19:30:41 EDT Ventricular Rate:  111 PR Interval:  93 QRS Duration:  111 QT Interval:  241 QTC Calculation: 328 R Axis:   47  Text Interpretation: Ectopic atrial tachycardia, unifocal Repol abnrm, severe global ischemia (LM/MVD) New since previous tracing Confirmed by Vanetta Mulders [id number]) on 03/28/2023 7:34:00 PM  Radiology DG Chest 2 View  Result Date: 03/28/2023 CLINICAL DATA:  Chest pain EXAM: CHEST - 2 VIEW COMPARISON:  01/06/2023 FINDINGS: The cardio pericardial silhouette is enlarged. Streaky opacity in the left lower lung is compatible with atelectasis or scarring. Otherwise no focal consolidation, pulmonary edema, or pleural effusion. No acute bony abnormality. Telemetry leads overlie the chest. IMPRESSION: Streaky opacity in the left lower lung compatible with atelectasis or scarring. Otherwise no acute cardiopulmonary findings. Electronically Signed   By: Kennith Center M.D.   On: 03/28/2023 20:32    Procedures Procedures     Medications Ordered in ED Medications  metoprolol tartrate (LOPRESSOR) injection 5 mg (has no administration in time range)  morphine (PF) 2 MG/ML injection 2 mg (2 mg Intravenous Given 03/28/23 2004)  ondansetron (ZOFRAN) injection 4 mg (4 mg Intravenous Given 03/28/23 2004)  adenosine (ADENOCARD) 6 MG/2ML injection 6 mg (6 mg Intravenous Given 03/28/23 2248)    ED Course/ Medical Decision Making/ A&P                                 Medical Decision Making Amount and/or Complexity of Data Reviewed Labs: ordered. Radiology: ordered.  Risk Prescription drug management. Decision regarding hospitalization.   CRITICAL CARE Performed by: Vanetta Mulders Total critical care time: 40 minutes Critical care time was exclusive of separately billable procedures and treating other patients. Critical care was necessary to treat or prevent imminent or life-threatening deterioration. Critical care was time spent personally by me on the following activities: development of treatment plan with patient and/or surrogate as well as nursing, discussions with consultants, evaluation of patient's response to treatment, examination of patient, obtaining history from patient or surrogate, ordering and performing treatments and interventions, ordering and review of laboratory studies, ordering and review of radiographic studies, pulse oximetry and re-evaluation of patient's condition.  Did addend this in 6 mg with pads on place.  Patient to get the pause.  It just seemed to slow down what she currently had.  Now she is back into the same rhythm no evidence of any conversion.  Spoken to on-call cardiology earlier they recommend then going with beta-blocker.  Have ordered Lopressor IV.  Plan was admission down to [facility].  Will recontact cardiology.  Figured this will be a hospitalist admission there.  Patient is a initial troponin 38.  Patient has had chest pressure.  Basic metabolic panel normal.  CBC white  blood cell count 13.4.  Chest x-ray streaky opacity left lower lung compatible with active atelectasis or scarring.  Patient does not have any symptoms consistent with pneumonia.  Patient's delta troponin went up to 53.  Both his troponins are not too unusual.  Under the circumstances.  Patient is very comfortable nontoxic.  Cussed with on-call fellow Dr. Orson Aloe.  He agrees with plan.  Hospitalist admission down to [facility] they will consult.  Audiology does mention that it could be atrial flutter.  And I agree that when she pause that was a possibility.   Final Clinical Impression(s) / ED Diagnoses Final diagnoses:  SVT (supraventricular tachycardia)  WPW (Wolff-Parkinson-White syndrome)    Rx / DC Orders ED Discharge Orders     None         Vanetta Mulders, MD 03/28/23 1610    Vanetta Mulders, MD 03/28/23 9604    Vanetta Mulders, MD 03/28/23 2306

## 2023-03-28 NOTE — ED Notes (Signed)
Pt  unhooked by lab tech, and pt independently ambulated to the restroom

## 2023-03-29 DIAGNOSIS — I456 Pre-excitation syndrome: Secondary | ICD-10-CM

## 2023-03-29 DIAGNOSIS — I483 Typical atrial flutter: Secondary | ICD-10-CM

## 2023-03-29 DIAGNOSIS — D72829 Elevated white blood cell count, unspecified: Secondary | ICD-10-CM

## 2023-03-29 DIAGNOSIS — E669 Obesity, unspecified: Secondary | ICD-10-CM | POA: Insufficient documentation

## 2023-03-29 DIAGNOSIS — I4891 Unspecified atrial fibrillation: Secondary | ICD-10-CM

## 2023-03-29 DIAGNOSIS — I4892 Unspecified atrial flutter: Secondary | ICD-10-CM | POA: Diagnosis present

## 2023-03-29 DIAGNOSIS — I4719 Other supraventricular tachycardia: Secondary | ICD-10-CM

## 2023-03-29 DIAGNOSIS — R7989 Other specified abnormal findings of blood chemistry: Secondary | ICD-10-CM

## 2023-03-29 LAB — COMPREHENSIVE METABOLIC PANEL
ALT: 28 U/L (ref 0–44)
AST: 30 U/L (ref 15–41)
Albumin: 3.5 g/dL (ref 3.5–5.0)
Alkaline Phosphatase: 42 U/L (ref 38–126)
Anion gap: 11 (ref 5–15)
BUN: 14 mg/dL (ref 6–20)
CO2: 25 mmol/L (ref 22–32)
Calcium: 8.9 mg/dL (ref 8.9–10.3)
Chloride: 105 mmol/L (ref 98–111)
Creatinine, Ser: 0.94 mg/dL (ref 0.44–1.00)
GFR, Estimated: >60 mL/min (ref >60)
Glucose, Bld: 94 mg/dL (ref 70–99)
Potassium: 3.8 mmol/L (ref 3.5–5.1)
Sodium: 141 mmol/L (ref 135–145)
Total Bilirubin: 0.4 mg/dL (ref 0.3–1.2)
Total Protein: 7 g/dL (ref 6.5–8.1)

## 2023-03-29 LAB — TROPONIN I (HIGH SENSITIVITY)
Troponin I (High Sensitivity): 97 ng/L — ABNORMAL HIGH (ref <18)
Troponin I (High Sensitivity): 102 ng/L (ref <18)

## 2023-03-29 LAB — CBC WITH DIFFERENTIAL/PLATELET
Abs Immature Granulocytes: 0.06 10*3/uL (ref 0.00–0.07)
Basophils Absolute: 0.1 10*3/uL (ref 0.0–0.1)
Basophils Relative: 1 %
Eosinophils Absolute: 0.3 10*3/uL (ref 0.0–0.5)
Eosinophils Relative: 2 %
HCT: 37.3 % (ref 36.0–46.0)
Hemoglobin: 12.1 g/dL (ref 12.0–15.0)
Immature Granulocytes: 1 %
Lymphocytes Relative: 25 %
Lymphs Abs: 2.7 10*3/uL (ref 0.7–4.0)
MCH: 28.9 pg (ref 26.0–34.0)
MCHC: 32.4 g/dL (ref 30.0–36.0)
MCV: 89 fL (ref 80.0–100.0)
Monocytes Absolute: 0.8 10*3/uL (ref 0.1–1.0)
Monocytes Relative: 8 %
Neutro Abs: 6.8 10*3/uL (ref 1.7–7.7)
Neutrophils Relative %: 63 %
Platelets: 267 10*3/uL (ref 150–400)
RBC: 4.19 MIL/uL (ref 3.87–5.11)
RDW: 14.4 % (ref 11.5–15.5)
WBC: 10.7 10*3/uL — ABNORMAL HIGH (ref 4.0–10.5)
nRBC: 0 % (ref 0.0–0.2)

## 2023-03-29 LAB — MAGNESIUM
Magnesium: 2.1 mg/dL (ref 1.7–2.4)
Magnesium: 2.2 mg/dL (ref 1.7–2.4)

## 2023-03-29 MED ORDER — OXYCODONE HCL 5 MG PO TABS: 5.0000 mg | ORAL_TABLET | ORAL | Status: DC | PRN

## 2023-03-29 MED ORDER — APIXABAN 5 MG PO TABS
5.0000 mg | ORAL_TABLET | Freq: Two times a day (BID) | ORAL | Status: DC
  Administered 2023-03-29 – 2023-03-31 (×5): 5 mg via ORAL
  Filled 2023-03-29 (×5): qty 1

## 2023-03-29 MED ORDER — AMIODARONE HCL 200 MG PO TABS
400.0000 mg | ORAL_TABLET | Freq: Two times a day (BID) | ORAL | Status: DC
  Administered 2023-03-29 – 2023-03-31 (×5): 400 mg via ORAL
  Filled 2023-03-29 (×5): qty 2

## 2023-03-29 MED ORDER — ONDANSETRON HCL 4 MG/2ML IJ SOLN: 4.0000 mg | Freq: Four times a day (QID) | INTRAMUSCULAR | Status: DC | PRN

## 2023-03-29 MED ORDER — HEPARIN SODIUM (PORCINE) 5000 UNIT/ML IJ SOLN
5000.0000 [IU] | Freq: Three times a day (TID) | INTRAMUSCULAR | Status: DC
  Administered 2023-03-29: 5000 [IU] via SUBCUTANEOUS
  Filled 2023-03-29: qty 1

## 2023-03-29 MED ORDER — METOPROLOL TARTRATE 5 MG/5ML IV SOLN: 5.0000 mg | INTRAVENOUS | Status: DC | PRN

## 2023-03-29 MED ORDER — ACETAMINOPHEN 650 MG RE SUPP: 650.0000 mg | Freq: Four times a day (QID) | RECTAL | Status: DC | PRN

## 2023-03-29 MED ORDER — AMIODARONE HCL 200 MG PO TABS: 200.0000 mg | ORAL_TABLET | Freq: Every day | ORAL | Status: DC

## 2023-03-29 MED ORDER — METOPROLOL SUCCINATE ER 50 MG PO TB24: 50.0000 mg | ORAL_TABLET | Freq: Every day | ORAL | Status: DC

## 2023-03-29 MED ORDER — AMIODARONE HCL 200 MG PO TABS
200.0000 mg | ORAL_TABLET | Freq: Every day | ORAL | Status: DC
  Administered 2023-03-29: 200 mg via ORAL
  Filled 2023-03-29 (×2): qty 1

## 2023-03-29 MED ORDER — METOPROLOL SUCCINATE ER 50 MG PO TB24
50.0000 mg | ORAL_TABLET | Freq: Every day | ORAL | Status: DC
  Administered 2023-03-29: 50 mg via ORAL
  Filled 2023-03-29 (×2): qty 1

## 2023-03-29 MED ORDER — ONDANSETRON HCL 4 MG PO TABS: 4.0000 mg | ORAL_TABLET | Freq: Four times a day (QID) | ORAL | Status: DC | PRN

## 2023-03-29 MED ORDER — ACETAMINOPHEN 325 MG PO TABS: 650.0000 mg | ORAL_TABLET | Freq: Four times a day (QID) | ORAL | Status: DC | PRN

## 2023-03-29 MED ORDER — ENOXAPARIN SODIUM 60 MG/0.6ML IJ SOSY
60.0000 mg | PREFILLED_SYRINGE | INTRAMUSCULAR | Status: DC
  Administered 2023-03-29: 60 mg via SUBCUTANEOUS
  Filled 2023-03-29: qty 0.6

## 2023-03-29 MED ORDER — ASPIRIN 81 MG PO TBEC
81.0000 mg | DELAYED_RELEASE_TABLET | Freq: Every day | ORAL | Status: DC
  Administered 2023-03-29: 81 mg via ORAL
  Filled 2023-03-29: qty 1

## 2023-03-29 MED ORDER — METOPROLOL TARTRATE 5 MG/5ML IV SOLN
5.0000 mg | INTRAVENOUS | Status: DC | PRN
  Administered 2023-03-29: 5 mg via INTRAVENOUS
  Filled 2023-03-29: qty 5

## 2023-03-29 MED ORDER — METOPROLOL SUCCINATE ER 50 MG PO TB24
50.0000 mg | ORAL_TABLET | Freq: Two times a day (BID) | ORAL | Status: DC
  Administered 2023-03-29 – 2023-03-30 (×3): 50 mg via ORAL
  Filled 2023-03-29 (×4): qty 1

## 2023-03-29 NOTE — Assessment & Plan Note (Signed)
-  Trop 38 and 53 -Likely demand ischemia in the setting of tachycardia -Trend another troponin -consult cardiology

## 2023-03-29 NOTE — Assessment & Plan Note (Signed)
BMI 39.87

## 2023-03-29 NOTE — ED Notes (Signed)
Date and time results received: 03/29/23 0140 (use smartphrase ".now" to insert current time)  Test: Trop Critical Value: 13  Name of Provider Notified: Zierle-Ghosh  Orders Received? Or Actions Taken?:  acknowledged

## 2023-03-29 NOTE — Assessment & Plan Note (Signed)
-  adenosine was given, but patient remains tachycardic -There was questionable a-flutter after adenosine -Cards advised continued IV beta blocker to control tachycardia -Monitor on tele -Transfer to [facility] as per cards -Continue amiodarone -Monitor on tele

## 2023-03-29 NOTE — Assessment & Plan Note (Signed)
-  continue amiodarone and metoprolol -Consult cardiology -Continue to monitor on tele

## 2023-03-29 NOTE — Consult Note (Addendum)
Cardiology Consultation   Patient ID: Pebble Botkin MRN: [id number]; DOB: 06-Nov-1965  Admit date: 03/28/2023 Date of Consult: 03/29/2023  PCP:  Marinus Maw, MD   [facility] Providers Cardiologist:  Peter Swaziland, MD  Electrophysiologist:  Lanier Prude, MD  {  Patient Profile:   [name] is a 57 y.o. female with a hx of persistent atrial fibrillation, SVT, history of WPW, obesity who is being seen 03/29/2023 for the evaluation of atrial tachycardia at the request of Dr. Sharon Seller.  History of Present Illness:   Ms. Dettmann is currently followed by electrophysiology for her atrial fibrillation and history of WPW but underwent a EP study 02/24/2017 that showed no accessory pathway conduction but inducible atrial fibrillation. She has had previous admissions for her tachycardia and has required multiple cardioversions in the past.  She has previously been on flecainide in 2018 however this was stopped and transitioned to amiodarone given recurrent arrhythmias in March 2024 and with plan for ablation however this has not been done due to lack of insurance. She was cardioverted this admission due to chest pain and hypotension.  She had elevated troponins thought related due to demand ischemia.  Catheterization was not pursued due to previous history of normal coronary anatomy in March 2023.  Then again in July she presented with RVR and had converted in the emergency room.  Unclear if this was medically induced or on what medications.  This was also in the setting of hypokalemia 2.9. She was last seen by electrophysiology in August 2024 with continued plan of amiodarone 200 daily and as needed metoprolol 25 mg every 4 for heart rates above 100.  Currently she has been admitted once again for recurrent symptoms of palpitations, shortness of breath, chest pain, dizziness.  She reports that she has minimal episodes approximately once to twice a week that are manageable and  do not interfere with her ADLs however yesterday had more significant event that persisted despite taking her as needed metoprolol and other medications.  At some point she was given adenosine without resolution however revealed atrial flutter.  Currently she feels much better without significant symptoms now.  Denies any chest pain or aforementioned symptoms.   EKG showed atrial tachycardia.  [phone].  WBC 10.7.  No other significant lab abnormalities or images.  Past Medical History:  Diagnosis Date   Atrial fibrillation with rapid ventricular response (HCC) 12/24/2016   Atrial fibrillation with RVR (HCC) 12/26/2016   Chest pain 12/24/2016   Elevated troponin 12/25/2016   Hypokalemia 12/24/2016   Leukocytosis 12/24/2016   Sepsis (HCC)    WPW (Wolff-Parkinson-White syndrome) 12/26/2016    Past Surgical History:  Procedure Laterality Date   ABDOMINAL HYSTERECTOMY     KNEE SURGERY     LEFT HEART CATH AND CORONARY ANGIOGRAPHY N/A 08/30/2021   Procedure: LEFT HEART CATH AND CORONARY ANGIOGRAPHY;  Surgeon: Orbie Pyo, MD;  Location: [facility] INVASIVE CV LAB;  Service: Cardiovascular;  Laterality: N/A;   V TACH ABLATION N/A 02/24/2017   Procedure: V Tach Ablation WPW Ablation;  Surgeon: Marinus Maw, MD;  Location: [facility] INVASIVE CV LAB;  Service: Cardiovascular;  Laterality: N/A;     Inpatient Medications: Scheduled Meds:  amiodarone  200 mg Oral Daily   aspirin EC  81 mg Oral Daily   enoxaparin (LOVENOX) injection  40 mg Subcutaneous Q24H   metoprolol succinate  50 mg Oral BID   Continuous Infusions:  PRN Meds: acetaminophen, metoprolol tartrate, ondansetron **OR** ondansetron (  ZOFRAN) IV, oxyCODONE  Allergies:    Allergies  Allergen Reactions   Penicillins Anaphylaxis, Shortness Of Breath and Swelling   Bee Venom Swelling and Other (See Comments)    Swelling and hardness at the site of bee sting.    Social History:   Social History   Socioeconomic History   Marital  status: Married    Spouse name: Not on file   Number of children: Not on file   Years of education: Not on file   Highest education level: Not on file  Occupational History   Not on file  Tobacco Use   Smoking status: Never   Smokeless tobacco: Never  Vaping Use   Vaping status: Never Used  Substance and Sexual Activity   Alcohol use: Yes    Comment: occ   Drug use: No   Sexual activity: Not on file  Other Topics Concern   Not on file  Social History Narrative   Not on file   Social Determinants of Health   Financial Resource Strain: Medium Risk (10/14/2022)   Overall Financial Resource Strain (CARDIA)    Difficulty of Paying Living Expenses: Somewhat hard  Food Insecurity: No Food Insecurity (03/29/2023)   Hunger Vital Sign    Worried About Running Out of Food in the Last Year: Never true    Ran Out of Food in the Last Year: Never true  Transportation Needs: No Transportation Needs (03/29/2023)   PRAPARE - Administrator, Civil Service (Medical): No    Lack of Transportation (Non-Medical): No  Physical Activity: Not on file  Stress: Not on file  Social Connections: Not on file  Intimate Partner Violence: Not At Risk (03/29/2023)   Humiliation, Afraid, Rape, and Kick questionnaire    Fear of Current or Ex-Partner: No    Emotionally Abused: No    Physically Abused: No    Sexually Abused: No    Family History:   Family History  Problem Relation Age of Onset   Diabetes Mellitus II Mother    Heart failure Mother    Heart attack Father    Stroke Father      ROS:  Please see the history of present illness.  All other ROS reviewed and negative.     Physical Exam/Data:   Vitals:   03/29/23 0341 03/29/23 0551 03/29/23 0712 03/29/23 0721  BP: (!) 148/98 129/78 (!) 150/79 (!) 146/93  Pulse: (!) 114 (!) 115 65 (!) 101  Resp: 18 16 17 14   Temp: 97.7 F (36.5 C) 98.4 F (36.9 C)  98.4 F (36.9 C)  TempSrc: Oral Oral  Oral  SpO2: 98% 95% 100% 100%   Weight: 125.3 kg     Height: 5\' 9"  (1.753 m)       Intake/Output Summary (Last 24 hours) at 03/29/2023 1140 Last data filed at 03/29/2023 0830 Gross per 24 hour  Intake 240 ml  Output 0 ml  Net 240 ml      03/29/2023    3:41 AM 03/28/2023    7:18 PM 01/30/2023    8:57 AM  Last 3 Weights  Weight (lbs) 276 lb 3.8 oz 270 lb 279 lb 9.6 oz  Weight (kg) 125.3 kg 122.471 kg 126.826 kg     Body mass index is 40.79 kg/m.  General:  Well nourished, well developed, in no acute distress HEENT: normal Neck: no JVD Vascular: No carotid bruits; Distal pulses 2+ bilaterally Cardiac: Tachycardic Lungs:  clear to auscultation bilaterally, no wheezing, rhonchi  or rales  Abd: soft, nontender, no hepatomegaly  Ext: no edema Musculoskeletal:  No deformities, BUE and BLE strength normal and equal Skin: warm and dry  Neuro:  CNs 2-12 intact, no focal abnormalities noted Psych:  Normal affect   EKG:  The EKG was personally reviewed and demonstrates: Atrial flutter, heart rate 109 Telemetry:  Telemetry was personally reviewed and demonstrates: Atrial flutter heart rates in the 110s  Relevant CV Studies: Left heart catheterization 08/30/2021  LV end diastolic pressure is mildly elevated.   The left ventricular ejection fraction is 55-65% by visual estimate.   1.  Normal right dominant coronary circulation. 2.  LVEDP of 20 mmHg with preserved ejection fraction.   The results were reviewed with Dr. Lalla Brothers.   Recommendations: Medical therapy.  Echocardiogram 08/30/2021 1. Left ventricular ejection fraction, by estimation, is 50 to 55%. Left  ventricular ejection fraction by 2D MOD biplane is 50.8 %. The left  ventricle has low normal function. The left ventricle has no regional wall  motion abnormalities. There is mild  left ventricular hypertrophy. Left ventricular diastolic function could  not be evaluated.   2. Right ventricular systolic function is normal. The right ventricular  size is  normal. Tricuspid regurgitation signal is inadequate for assessing  PA pressure.   3. Left atrial size was mildly dilated.   4. The mitral valve is grossly normal. Trivial mitral valve  regurgitation.   5. The aortic valve is tricuspid. Aortic valve regurgitation is trivial.  Aortic valve sclerosis is present, with no evidence of aortic valve  stenosis.   Comparison(s): No prior Echocardiogram.   Laboratory Data:  High Sensitivity Troponin:   Recent Labs  Lab 03/28/23 1956 03/28/23 2142 03/29/23 0059 03/29/23 0431  TROPONINIHS 38* 53* 97* 102*     Chemistry Recent Labs  Lab 03/28/23 1956  NA 138  K 3.9  CL 104  CO2 27  GLUCOSE 98  BUN 19  CREATININE 0.97  CALCIUM 9.1  GFRNONAA >60  ANIONGAP 7    No results for input(s): "PROT", "ALBUMIN", "AST", "ALT", "ALKPHOS", "BILITOT" in the last 168 hours. Lipids No results for input(s): "CHOL", "TRIG", "HDL", "LABVLDL", "LDLCALC", "CHOLHDL" in the last 168 hours.  Hematology Recent Labs  Lab 03/28/23 1956 03/29/23 0431  WBC 13.5* 10.7*  RBC 4.39 4.19  HGB 12.8 12.1  HCT 40.0 37.3  MCV 91.1 89.0  MCH 29.2 28.9  MCHC 32.0 32.4  RDW 14.4 14.4  PLT 249 267   Thyroid No results for input(s): "TSH", "FREET4" in the last 168 hours.  BNPNo results for input(s): "BNP", "PROBNP" in the last 168 hours.  DDimer No results for input(s): "DDIMER" in the last 168 hours.   Radiology/Studies:  DG Chest 2 View  Result Date: 03/28/2023 CLINICAL DATA:  Chest pain EXAM: CHEST - 2 VIEW COMPARISON:  01/06/2023 FINDINGS: The cardio pericardial silhouette is enlarged. Streaky opacity in the left lower lung is compatible with atelectasis or scarring. Otherwise no focal consolidation, pulmonary edema, or pleural effusion. No acute bony abnormality. Telemetry leads overlie the chest. IMPRESSION: Streaky opacity in the left lower lung compatible with atelectasis or scarring. Otherwise no acute cardiopulmonary findings. Electronically Signed    By: Kennith Center M.D.   On: 03/28/2023 20:32     Assessment and Plan:   Typical atrial flutter  Hx of WPW with previous study in 2018 showing no accessory pathway This is a recurrent issue with multiple cardioversions and admissions before in the past.  Previously  on flecainide in March 2024 with failure and transition to amiodarone with plans for ablation however not completed due to lack of insurance.  Amiodarone not ideal long-term due to adverse side effects and young age.  Limited options currently.  As she is stable with minimal symptoms can plan for loading dose of amiodarone.  Can consider outpatient DCCV if she remains to be in flutter despite loading dose.  Makynzi Eastland defer to EP MD titration and further regiment.  On Toprol-XL 50 mg twice daily with 25 mg Lopressor every 4 hours as needed with heart rates above 100. Has normal EF based off March 2023's echo. Not on DOAC due to low CHA2DS2-VASc score of 1.  On aspirin however there is no indications for this for A-fib No electrolyte abnormalities   Demand ischemia   38-53-97-102, elevated trops likely due to RVR.  Previous cardiac catheterization in March 2023 showed normal coronary anatomy    Risk Assessment/Risk Scores:   CHA2DS2-VASc Score = 1  This indicates a 0.6% annual risk of stroke. The patient's score is based upon: CHF History: 0 HTN History: 0 Diabetes History: 0 Stroke History: 0 Vascular Disease History: 0 Age Score: 0 Gender Score: 1      For questions or updates, please contact [facility] Please consult [email or web address] for contact info under    Signed, Abagail Kitchens, PA-C  03/29/2023 11:40 AM   I have seen and examined this patient with Yvonna Alanis.  Agree with above, note added to reflect my findings.  Patient with a past history as above.  She has a longstanding history of atrial fibrillation.  She was initially on flecainide but has been transitioned to amiodarone.  She would benefit from  ablation, though she is not insured currently.  She presented to the emergency room with recurrent palpitations, shortness of breath, chest pain, dizziness.  Usually her symptoms are manageable on amiodarone, but this time, it was much worse.  She was somewhat diaphoretic.  Currently, she feels well.  She is in atrial flutter.  She does have some intermittent sharp chest pain, but otherwise is without complaint  GEN: Well nourished, well developed, in no acute distress  HEENT: normal  Neck: no JVD, carotid bruits, or masses Cardiac: tachycardic, regular; no murmurs, rubs, or gallops,no edema  Respiratory:  clear to auscultation bilaterally, normal work of breathing GI: soft, nontender, nondistended, + BS MS: no deformity or atrophy  Skin: warm and dry Neuro:  Strength and sensation are intact Psych: euthymic mood, full affect   Atrial fibrillation/typical atrial flutter: Patient presented to the hospital in atrial flutter.  Currently on Toprol-XL and amiodarone.  She is not anticoagulated due to low stroke risk.  For now, we Ron Beske start Eliquis.  Carsten Carstarphen increase amiodarone to 400 mg twice daily.  She does state that she went into atrial fibrillation yesterday at 4 PM.  She does not convert to sinus rhythm, she may benefit from cardioversion versus discharge from the hospital and cardioversion as an outpatient.  Sharunda Salmon M. Dody Smartt MD 03/29/2023 1:00 PM

## 2023-03-29 NOTE — H&P (Signed)
History and Physical    Patient: [name] [id number]:[id number] DOB: 1965/08/24 DOA: 03/28/2023 DOS: the patient was seen and examined on 03/29/2023 PCP: Marinus Maw, MD  Patient coming from: Home  Chief Complaint:  Chief Complaint  Patient presents with   Chest Pain   HPI: [name] is a 57 y.o. female with medical history significant of WPW, obesity, and more presents the ED with a chief complaint of palpitations.  Patient reports she was diagnosed 6 years ago, and she has these random episodes of tachycardia.  She reports no missed doses of her medications at home which are amiodarone and metoprolol.  Patient reports last time this happened was in July.  When it occurs it sudden onset, she reports it feels like she gets hit by a wall.  She feels her heart race and flip-flop.  She has medications at home for when she becomes symptomatic, so she took her short acting metoprolol.  It did not help.  She did not take her nitro did not help.  She then took her long-acting metoprolol for her nighttime dose.  It did not help.  She took another nitro which still did not relieve her symptoms.  She became presyncopal took a baby aspirin and took her last nitro.  Her symptoms continued so she called EMS.  Patient reports when she has chest pain feels like a heaviness.  It is right in the middle of her chest and it does not move.  It is constant.  She has associated nausea and dry heaving.  She has diaphoresis, dyspnea, dizziness.  The dizziness is much worse with exertion.  She felt like she could not stand up from the couch or she would pass out.  She reports when she was driving home from work she felt like she was going to pass out several times and had to pull over several times.  There are no provoking factors for these episodes that she can identify.  She reports she was in her normal state of health with a fun weekend planned.  Patient reports all the symptoms started around 4 PM.  She  ended up calling for EMS between 530 and 5:45 PM.  Patient does follow with cardiology.  ED provider spoke with cardiology who recommended trying adenosine.  I did block her, and for a moment it look like she had a flutter, and then her heart rate returned to 1 teens.  Patient reports symptomatically she feels better, but she knows that her heart rate is still high.  She has no other complaints at this time.  Patient does not smoke.  She drinks rarely.  She is full code, but would not want to be on life support long-term. Review of Systems: As mentioned in the history of present illness. All other systems reviewed and are negative. Past Medical History:  Diagnosis Date   Atrial fibrillation with rapid ventricular response (HCC) 12/24/2016   Atrial fibrillation with RVR (HCC) 12/26/2016   Chest pain 12/24/2016   Elevated troponin 12/25/2016   Hypokalemia 12/24/2016   Leukocytosis 12/24/2016   Sepsis (HCC)    WPW (Wolff-Parkinson-White syndrome) 12/26/2016   Past Surgical History:  Procedure Laterality Date   ABDOMINAL HYSTERECTOMY     KNEE SURGERY     LEFT HEART CATH AND CORONARY ANGIOGRAPHY N/A 08/30/2021   Procedure: LEFT HEART CATH AND CORONARY ANGIOGRAPHY;  Surgeon: Orbie Pyo, MD;  Location: [facility] INVASIVE CV LAB;  Service: Cardiovascular;  Laterality: N/A;   V TACH  ABLATION N/A 02/24/2017   Procedure: V Tach Ablation WPW Ablation;  Surgeon: Marinus Maw, MD;  Location: [facility] INVASIVE CV LAB;  Service: Cardiovascular;  Laterality: N/A;   Social History:  reports that she has never smoked. She has never used smokeless tobacco. She reports current alcohol use. She reports that she does not use drugs.  Allergies  Allergen Reactions   Penicillins Anaphylaxis, Shortness Of Breath and Swelling   Bee Venom Swelling and Other (See Comments)    Swelling and hardness at the site of bee sting.    Family History  Problem Relation Age of Onset   Diabetes Mellitus II Mother    Heart failure  Mother    Heart attack Father    Stroke Father     Prior to Admission medications   Medication Sig Start Date End Date Taking? Authorizing Provider  amiodarone (PACERONE) 200 MG tablet Take 1 tablet (200 mg total) by mouth daily. Patient taking differently: Take 200 mg by mouth at bedtime. 01/30/23  Yes Lanier Prude, MD  aspirin EC 81 MG tablet Take 1 tablet (81 mg total) by mouth daily. Swallow whole. 09/26/21  Yes Lanier Prude, MD  metoprolol succinate (TOPROL-XL) 50 MG 24 hr tablet Take 1 tablet (50 mg total) by mouth in the morning and at bedtime. Take with or immediately following a meal. 01/30/23  Yes Lanier Prude, MD  metoprolol tartrate (LOPRESSOR) 25 MG tablet Take 1 tablet (25 mg) every 4 hours as needed for sustained heart rates greater than 100bpm 01/30/23  Yes Lanier Prude, MD  nitroGLYCERIN (NITROSTAT) 0.4 MG SL tablet Take one tablet by mouth as needed for exertional chest pain discomfort.  May take 1 tablet by mouth daily. 10/15/22  Yes Sheilah Pigeon, PA-C    Physical Exam: Vitals:   03/28/23 2246 03/28/23 2247 03/28/23 2248 03/28/23 2249  BP:      Pulse: (!) 111 (!) 112 (!) 111 (!) 110  Resp: 17 20 17 15   Temp:      TempSrc:      SpO2: 98% 97% 96% 97%  Weight:      Height:       1.  General: Patient lying supine in bed,  no acute distress   2. Psychiatric: Alert and oriented x 3, mood and behavior normal for situation, pleasant and cooperative with exam   3. Neurologic: Speech and language are normal, face is symmetric, moves all 4 extremities voluntarily, at baseline without acute deficits on limited exam   4. HEENMT:  Head is atraumatic, normocephalic, pupils reactive to light, neck is supple, trachea is midline, mucous membranes are moist   5. Respiratory : Lungs are clear to auscultation bilaterally without wheezing, rhonchi, rales, no cyanosis, no increase in work of breathing or accessory muscle use   6. Cardiovascular : Heart rate  tachycardic, rhythm is regular, no murmurs, rubs or gallops, no peripheral edema, peripheral pulses palpated   7. Gastrointestinal:  Abdomen is soft, nondistended, nontender to palpation bowel sounds active, no masses or organomegaly palpated   8. Skin:  Skin is warm, dry and intact without rashes, acute lesions, or ulcers on limited exam   9.Musculoskeletal:  No acute deformities or trauma, no asymmetry in tone, no peripheral edema, peripheral pulses palpated, no tenderness to palpation in the extremities  Data Reviewed: In the ED temp 98.1, heart rate 110-9 13, respiratory rate 9-20, blood pressure 119/82-134/96, satting 91-98% Leukocytosis at 13.5, hemoglobin 12.8, platelets 249 Chemistries  unremarkable Troponin 38 and then 53 Chest x-ray shows left lung atelectasis EKG shows a heart rate of 111 with atrial tach and a QTc of 328 Cardiology recommended admitting to [facility].  Continue IV beta-blockers as needed to control heart rate Admission requested for atrial tachycardia Assessment and Plan: * Atrial tachycardia -adenosine was given, but patient remains tachycardic -There was questionable a-flutter after adenosine -Cards advised continued IV beta blocker to control tachycardia -Monitor on tele -Transfer to [facility] as per cards -Continue amiodarone -Monitor on tele  Obesity (BMI 30-39.9) -BMI 39.87  WPW (Wolff-Parkinson-White syndrome) -continue amiodarone and metoprolol -Consult cardiology -Continue to monitor on tele  Elevated troponin -Trop 38 and 53 -Likely demand ischemia in the setting of tachycardia -Trend another troponin -consult cardiology  Leukocytosis -Likely stress reaction -No infectious symptoms -trend in the AM      Advance Care Planning:   Code Status: Full Code   Consults: Cardiology  Family Communication: No family at bedside  Severity of Illness: The appropriate patient status for this patient is OBSERVATION. Observation status is  judged to be reasonable and necessary in order to provide the required intensity of service to ensure the patient's safety. The patient's presenting symptoms, physical exam findings, and initial radiographic and laboratory data in the context of their medical condition is felt to place them at decreased risk for further clinical deterioration. Furthermore, it is anticipated that the patient will be medically stable for discharge from the hospital within 2 midnights of admission.   Author: Lilyan Gilford, DO 03/29/2023 1:05 AM  For on call review [email or web address].

## 2023-03-29 NOTE — ED Notes (Signed)
ED TO INPATIENT HANDOFF REPORT  ED Nurse Name and Phone #:   S Name/Age/Gender [name] 57 y.o. female Room/Bed: APA07/APA07  Code Status   Code Status: Full Code  Home/SNF/Other Home Patient oriented to: self, place, time, and situation Is this baseline? Yes   Triage Complete: Triage complete  Chief Complaint Atrial tachycardia (HCC) [I47.19]  Triage Note Pt arrives from Home via EMS with CP starting at 4 pm. Pt was mopping when it started at work. Pt took 3 nitroglycerin PTA and aspirin and metoprolol. Did reports nausea, diaphoresis with episode. HR with EMS 135 BP 126/82, 20 g Left AC    Allergies Allergies  Allergen Reactions   Penicillins Anaphylaxis, Shortness Of Breath and Swelling   Bee Venom Swelling and Other (See Comments)    Swelling and hardness at the site of bee sting.    Level of Care/Admitting Diagnosis ED Disposition     ED Disposition  Admit   Condition  --   Comment  Hospital [facility]: [facility] [[id number]]  Level of Care: Telemetry Cardiac [103]  May place patient in observation at [facility] or [facility] if equivalent level of care is available:: No  Covid Evaluation: Asymptomatic - no recent exposure (last 10 days) testing not required  Diagnosis: Atrial tachycardia [facility]) [[id number]]  Admitting Physician: Lilyan Gilford [[id number]]  Attending Physician: Lilyan Gilford [[id number]]          B Medical/Surgery History Past Medical History:  Diagnosis Date   Atrial fibrillation with rapid ventricular response (HCC) 12/24/2016   Atrial fibrillation with RVR (HCC) 12/26/2016   Chest pain 12/24/2016   Elevated troponin 12/25/2016   Hypokalemia 12/24/2016   Leukocytosis 12/24/2016   Sepsis (HCC)    WPW (Wolff-Parkinson-White syndrome) 12/26/2016   Past Surgical History:  Procedure Laterality Date   ABDOMINAL HYSTERECTOMY     KNEE SURGERY     LEFT HEART CATH AND CORONARY ANGIOGRAPHY N/A 08/30/2021    Procedure: LEFT HEART CATH AND CORONARY ANGIOGRAPHY;  Surgeon: Orbie Pyo, MD;  Location: [facility] INVASIVE CV LAB;  Service: Cardiovascular;  Laterality: N/A;   V TACH ABLATION N/A 02/24/2017   Procedure: V Tach Ablation WPW Ablation;  Surgeon: Marinus Maw, MD;  Location: [facility] INVASIVE CV LAB;  Service: Cardiovascular;  Laterality: N/A;     A IV Location/Drains/Wounds Patient Lines/Drains/Airways Status     Active Line/Drains/Airways     Name Placement date Placement time Site Days   Peripheral IV 01/06/23 20 G 1" Left Antecubital 01/06/23  1739  Antecubital  82            Intake/Output Last 24 hours No intake or output data in the 24 hours ending 03/29/23 0008  Labs/Imaging Results for orders placed or performed during the hospital encounter of 03/28/23 (from the past 48 hour(s))  Basic metabolic panel     Status: None   Collection Time: 03/28/23  7:56 PM  Result Value Ref Range   Sodium 138 135 - 145 mmol/L   Potassium 3.9 3.5 - 5.1 mmol/L   Chloride 104 98 - 111 mmol/L   CO2 27 22 - 32 mmol/L   Glucose, Bld 98 70 - 99 mg/dL    Comment: Glucose reference range applies only to samples taken after fasting for at least 8 hours.   BUN 19 6 - 20 mg/dL   Creatinine, Ser 6.57 0.44 - 1.00 mg/dL   Calcium 9.1 8.9 - 84.6 mg/dL   GFR, Estimated >96 >  60 mL/min    Comment: (NOTE) Calculated using the CKD-EPI Creatinine Equation (2021)    Anion gap 7 5 - 15    Comment: Performed at [facility], [address] [place], [place] [address]  CBC     Status: Abnormal   Collection Time: 03/28/23  7:56 PM  Result Value Ref Range   WBC 13.5 (H) 4.0 - 10.5 K/uL   RBC 4.39 3.87 - 5.11 MIL/uL   Hemoglobin 12.8 12.0 - 15.0 g/dL   HCT 62.1 30.8 - 65.7 %   MCV 91.1 80.0 - 100.0 fL   MCH 29.2 26.0 - 34.0 pg   MCHC 32.0 30.0 - 36.0 g/dL   RDW 84.6 96.2 - 95.2 %   Platelets 249 150 - 400 K/uL   nRBC 0.0 0.0 - 0.2 %    Comment: Performed at [facility], [address] [place],  [place] [address]  Troponin I (High Sensitivity)     Status: Abnormal   Collection Time: 03/28/23  7:56 PM  Result Value Ref Range   Troponin I (High Sensitivity) 38 (H) <18 ng/L    Comment: (NOTE) Elevated high sensitivity troponin I (hsTnI) values and significant  changes across serial measurements may suggest ACS but many other  chronic and acute conditions are known to elevate hsTnI results.  Refer to the "Links" section for chest pain algorithms and additional  guidance. Performed at [facility], [address] [place], [place] [address]   Troponin I (High Sensitivity)     Status: Abnormal   Collection Time: 03/28/23  9:42 PM  Result Value Ref Range   Troponin I (High Sensitivity) 53 (H) <18 ng/L    Comment: (NOTE) Elevated high sensitivity troponin I (hsTnI) values and significant  changes across serial measurements may suggest ACS but many other  chronic and acute conditions are known to elevate hsTnI results.  Refer to the "Links" section for chest pain algorithms and additional  guidance. Performed at [facility], [address] [place], [place] [address]    DG Chest 2 View  Result Date: 03/28/2023 CLINICAL DATA:  Chest pain EXAM: CHEST - 2 VIEW COMPARISON:  01/06/2023 FINDINGS: The cardio pericardial silhouette is enlarged. Streaky opacity in the left lower lung is compatible with atelectasis or scarring. Otherwise no focal consolidation, pulmonary edema, or pleural effusion. No acute bony abnormality. Telemetry leads overlie the chest. IMPRESSION: Streaky opacity in the left lower lung compatible with atelectasis or scarring. Otherwise no acute cardiopulmonary findings. Electronically Signed   By: Kennith Center M.D.   On: 03/28/2023 20:32    Pending Labs [employer] (From admission, onward)     Start     Ordered   Signed and Held  Comprehensive metabolic panel  Tomorrow morning,   R        Signed and Held   Signed and Held  Magnesium  Tomorrow morning,   R        Signed  and Held   Signed and Held  CBC with Differential/Platelet  Tomorrow morning,   R        Signed and Held            Vitals/Pain Today's Vitals   03/28/23 2246 03/28/23 2247 03/28/23 2248 03/28/23 2249  BP:      Pulse: (!) 111 (!) 112 (!) 111 (!) 110  Resp: 17 20 17 15   Temp:      TempSrc:      SpO2: 98% 97% 96% 97%  Weight:      Height:      PainSc:        Isolation Precautions No active isolations  Medications Medications  morphine (PF) 2 MG/ML injection 2 mg (2 mg Intravenous Given 03/28/23 2004)  ondansetron (ZOFRAN) injection 4 mg (4 mg Intravenous Given 03/28/23 2004)  adenosine (ADENOCARD) 6 MG/2ML injection 6 mg (6 mg Intravenous Given 03/28/23 2248)  metoprolol tartrate (LOPRESSOR) injection 5 mg (5 mg Intravenous Given 03/28/23 2304)    Mobility walks     Focused Assessments    R Recommendations: See Admitting Provider Note  Report given to:   Additional Notes:

## 2023-03-29 NOTE — Progress Notes (Signed)
   [name]  [id number]:[id number] DOB: May 04, 1966 DOA: 03/28/2023 PCP: Marinus Maw, MD    Brief Narrative:  57 year old with a history of WPW, obesity, and paroxysmal atrial fibrillation who presented to the [facility] ER 9/27 after experiencing palpitations.  She is known to have these episodes, though infrequent.  She has been compliant with her amiodarone and metoprolol.  She reported that her symptoms were associated with a sense of heaviness in the chest, and dizziness.  In the ER she was found to be tachycardic, dosed with adenosine, and subsequent transient slowing suggested underlying atrial fibs/flutter.  She was transferred to [facility] for cardiology evaluation.  Goals of Care:   Code Status: Full Code   DVT prophylaxis: heparin injection 5,000 Units Start: 03/29/23 0600 SCDs Start: 03/29/23 0044   Interim Hx: The patient was examined and interviewed by one of my partners earlier today.  She remains tachycardic with heart rate 112-115.  Blood pressure is preserved.  Oxygen saturations 100% on room air.  Assessment & Plan:  Persistent paroxysmal atrial fibrillation CHA2DS2-VASc is 1 therefore is on aspirin alone -followed by EP in outpatient setting with ongoing use of amiodarone and beta-blocker with extra doses of beta-blocker utilized to attempt to control at home - EP has increased her amio dose - cont prn IV lopressor for persistent tachycardia   Mild troponin elevation Likely simply due to to elevated rate and not reflective of ACS  WPW Followed by EP in the outpatient setting   Morbid obesity - Body mass index is 40.79 kg/m.   Family Communication: no family present at time of exam  Disposition:  anticipate d/c home when medically stable    Objective: Blood pressure (!) 146/93, pulse (!) 101, temperature 98.4 F (36.9 C), temperature source Oral, resp. rate 14, height 5\' 9"  (1.753 m), weight 125.3 kg, SpO2 100%.  Intake/Output Summary (Last 24 hours) at  03/29/2023 1117 Last data filed at 03/29/2023 0830 Gross per 24 hour  Intake 240 ml  Output 0 ml  Net 240 ml   Filed Weights   03/28/23 1918 03/29/23 0341  Weight: 122.5 kg 125.3 kg    Examination: Patient was examined by one of my partners earlier today.  I have seen her for a follow-up exam.  CBC: Recent Labs  Lab 03/28/23 1956 03/29/23 0431  WBC 13.5* 10.7*  NEUTROABS  --  6.8  HGB 12.8 12.1  HCT 40.0 37.3  MCV 91.1 89.0  PLT 249 267   Basic Metabolic Panel: Recent Labs  Lab 03/28/23 1956  NA 138  K 3.9  CL 104  CO2 27  GLUCOSE 98  BUN 19  CREATININE 0.97  CALCIUM 9.1   GFR: Estimated Creatinine Clearance: 89.6 mL/min (by C-G formula based on SCr of 0.97 mg/dL).   Scheduled Meds:  amiodarone  200 mg Oral Daily   aspirin EC  81 mg Oral Daily   heparin  5,000 Units Subcutaneous Q8H   metoprolol succinate  50 mg Oral Daily     LOS: 0 days   Lonia Blood, MD [facility] Office  [phone] Pager - Text Page per Loretha Stapler  If 7PM-7AM, please contact night-coverage per Amion 03/29/2023, 11:17 AM

## 2023-03-29 NOTE — Progress Notes (Signed)
   03/29/23 0341  Assess: MEWS Score  Temp 97.7 F (36.5 C)  BP (!) 148/98  MAP (mmHg) 112  Pulse Rate (!) 114  ECG Heart Rate (!) 113  Resp 18  Level of Consciousness Alert  SpO2 98 %  O2 Device Room Air  Assess: MEWS Score  MEWS Temp 0  MEWS Systolic 0  MEWS Pulse 2  MEWS RR 0  MEWS LOC 0  MEWS Score 2  MEWS Score Color Yellow  Assess: if the MEWS score is Yellow or Red  Were vital signs accurate and taken at a resting state? Yes  Does the patient meet 2 or more of the SIRS criteria? No  MEWS guidelines implemented  Yes, yellow  Treat  MEWS Interventions Considered administering scheduled or prn medications/treatments as ordered  Take Vital Signs  Increase Vital Sign Frequency  Yellow: Q2hr x1, continue Q4hrs until patient remains green for 12hrs  Escalate  MEWS: Escalate Yellow: Discuss with charge nurse and consider notifying provider and/or RRT  Notify: Charge Nurse/RN  Name of Charge Nurse/RN Notified Amor, RN  Assess: SIRS CRITERIA  SIRS Temperature  0  SIRS Pulse 1  SIRS Respirations  0  SIRS WBC 1  SIRS Score Sum  2

## 2023-03-29 NOTE — Assessment & Plan Note (Signed)
-  Likely stress reaction -No infectious symptoms -trend in the AM

## 2023-03-29 NOTE — Plan of Care (Signed)

## 2023-03-30 LAB — CBC
HCT: 38.6 % (ref 36.0–46.0)
Hemoglobin: 12.4 g/dL (ref 12.0–15.0)
MCH: 28.6 pg (ref 26.0–34.0)
MCHC: 32.1 g/dL (ref 30.0–36.0)
MCV: 89.1 fL (ref 80.0–100.0)
Platelets: 239 10*3/uL (ref 150–400)
RBC: 4.33 MIL/uL (ref 3.87–5.11)
RDW: 14.3 % (ref 11.5–15.5)
WBC: 6.8 10*3/uL (ref 4.0–10.5)
nRBC: 0 % (ref 0.0–0.2)

## 2023-03-30 LAB — COMPREHENSIVE METABOLIC PANEL
ALT: 29 U/L (ref 0–44)
AST: 33 U/L (ref 15–41)
Albumin: 3.3 g/dL — ABNORMAL LOW (ref 3.5–5.0)
Alkaline Phosphatase: 41 U/L (ref 38–126)
Anion gap: 10 (ref 5–15)
BUN: 15 mg/dL (ref 6–20)
CO2: 24 mmol/L (ref 22–32)
Calcium: 8.8 mg/dL — ABNORMAL LOW (ref 8.9–10.3)
Chloride: 104 mmol/L (ref 98–111)
Creatinine, Ser: 0.87 mg/dL (ref 0.44–1.00)
GFR, Estimated: >60 mL/min (ref >60)
Glucose, Bld: 118 mg/dL — ABNORMAL HIGH (ref 70–99)
Potassium: 3.2 mmol/L — ABNORMAL LOW (ref 3.5–5.1)
Sodium: 138 mmol/L (ref 135–145)
Total Bilirubin: 0.8 mg/dL (ref 0.3–1.2)
Total Protein: 6.7 g/dL (ref 6.5–8.1)

## 2023-03-30 LAB — MAGNESIUM: Magnesium: 2 mg/dL (ref 1.7–2.4)

## 2023-03-30 LAB — TSH: TSH: 4.958 u[IU]/mL — ABNORMAL HIGH (ref 0.350–4.500)

## 2023-03-30 MED ORDER — SODIUM CHLORIDE 0.9 % IV SOLN: INTRAVENOUS | Status: DC

## 2023-03-30 MED ORDER — POTASSIUM CHLORIDE CRYS ER 20 MEQ PO TBCR
40.0000 meq | EXTENDED_RELEASE_TABLET | Freq: Two times a day (BID) | ORAL | Status: AC
  Administered 2023-03-30 – 2023-03-31 (×3): 40 meq via ORAL
  Filled 2023-03-30 (×3): qty 2

## 2023-03-30 MED ORDER — METOPROLOL TARTRATE 5 MG/5ML IV SOLN
5.0000 mg | INTRAVENOUS | Status: DC | PRN
  Administered 2023-03-30: 5 mg via INTRAVENOUS
  Filled 2023-03-30: qty 5

## 2023-03-30 NOTE — H&P (View-Only) (Signed)
Rounding Note    Patient Name: [name] Date of Encounter: 03/30/2023  [facility] Cardiologist: Peter Swaziland, MD   Subjective   Overall feeling well.  Remains in atrial flutter.  Inpatient Medications    Scheduled Meds:  amiodarone  400 mg Oral BID   apixaban  5 mg Oral BID   metoprolol succinate  50 mg Oral BID   Continuous Infusions:  PRN Meds: acetaminophen, metoprolol tartrate, ondansetron **OR** ondansetron (ZOFRAN) IV, oxyCODONE   Vital Signs    Vitals:   03/29/23 1712 03/29/23 2001 03/29/23 2152 03/29/23 2322  BP:  (!) 150/98  117/75  Pulse:  (!) 118 (!) 116 (!) 113  Resp: 15 18  16   Temp: 98 F (36.7 C) 98.3 F (36.8 C)  98.1 F (36.7 C)  TempSrc: Oral Oral  Oral  SpO2:  97%  95%  Weight:      Height:        Intake/Output Summary (Last 24 hours) at 03/30/2023 0816 Last data filed at 03/29/2023 2100 Gross per 24 hour  Intake 720 ml  Output 0 ml  Net 720 ml      03/29/2023    3:41 AM 03/28/2023    7:18 PM 01/30/2023    8:57 AM  Last 3 Weights  Weight (lbs) 276 lb 3.8 oz 270 lb 279 lb 9.6 oz  Weight (kg) 125.3 kg 122.471 kg 126.826 kg      Telemetry    Atrial flutter- Personally Reviewed  ECG    None new- Personally Reviewed  Physical Exam   GEN: No acute distress.   Neck: No JVD Cardiac: Tachycardic, irregular, no murmurs, rubs, or gallops.  Respiratory: Clear to auscultation bilaterally. GI: Soft, nontender, non-distended  MS: No edema; No deformity. Neuro:  Nonfocal  Psych: Normal affect   Labs    High Sensitivity Troponin:   Recent Labs  Lab 03/28/23 1956 03/28/23 2142 03/29/23 0059 03/29/23 0431  TROPONINIHS 38* 53* 97* 102*     Chemistry Recent Labs  Lab 03/28/23 1956 03/29/23 0431 03/30/23 0408  NA 138  --  138  K 3.9  --  3.2*  CL 104  --  104  CO2 27  --  24  GLUCOSE 98  --  118*  BUN 19  --  15  CREATININE 0.97  --  0.87  CALCIUM 9.1  --  8.8*  MG  --  2.2 2.0  PROT  --   --  6.7   ALBUMIN  --   --  3.3*  AST  --   --  33  ALT  --   --  29  ALKPHOS  --   --  41  BILITOT  --   --  0.8  GFRNONAA >60  --  >60  ANIONGAP 7  --  10    Lipids No results for input(s): "CHOL", "TRIG", "HDL", "LABVLDL", "LDLCALC", "CHOLHDL" in the last 168 hours.  Hematology Recent Labs  Lab 03/28/23 1956 03/29/23 0431 03/30/23 0408  WBC 13.5* 10.7* 6.8  RBC 4.39 4.19 4.33  HGB 12.8 12.1 12.4  HCT 40.0 37.3 38.6  MCV 91.1 89.0 89.1  MCH 29.2 28.9 28.6  MCHC 32.0 32.4 32.1  RDW 14.4 14.4 14.3  PLT 249 267 239   Thyroid  Recent Labs  Lab 03/30/23 0408  TSH 4.958*    BNPNo results for input(s): "BNP", "PROBNP" in the last 168 hours.  DDimer No results for input(s): "DDIMER" in the last  168 hours.   Radiology    DG Chest 2 View  Result Date: 03/28/2023 CLINICAL DATA:  Chest pain EXAM: CHEST - 2 VIEW COMPARISON:  01/06/2023 FINDINGS: The cardio pericardial silhouette is enlarged. Streaky opacity in the left lower lung is compatible with atelectasis or scarring. Otherwise no focal consolidation, pulmonary edema, or pleural effusion. No acute bony abnormality. Telemetry leads overlie the chest. IMPRESSION: Streaky opacity in the left lower lung compatible with atelectasis or scarring. Otherwise no acute cardiopulmonary findings. Electronically Signed   By: Kennith Center M.D.   On: 03/28/2023 20:32    Cardiac Studies     Patient Profile     57 y.o. female presented to the hospital with atrial flutter  Assessment & Plan    1.  Atrial fibrillation/typical atrial flutter: Patient is on amiodarone.  CHA2DS2-VASc of 1 and thus not anticoagulated as an outpatient.  She has been started on Eliquis.  She had less than 24 hours of atrial flutter prior to starting Eliquis.  Amiodarone dose has been increased.  [name] plan for cardioversion tomorrow.  She [name] need 1 month of Eliquis post cardioversion which should be filled at the [facility] pharmacy prior to discharge, and [name] need amiodarone  400 mg daily for 1 month followed by 200 mg daily.  Risks and benefits of cardioversion were discussed.  She understands the risks and is agreed to the procedure.     For questions or updates, please contact [facility] Please consult [email or web address] for contact info under        Signed, [name] [name], MD  03/30/2023, 8:16 AM

## 2023-03-30 NOTE — Progress Notes (Addendum)
Rounding Note    Patient Name: [name] Date of Encounter: 03/30/2023  [facility] Cardiologist: Sydney Swaziland, MD   Subjective   Overall feeling well.  Remains in atrial flutter.  Inpatient Medications    Scheduled Meds:  amiodarone  400 mg Oral BID   apixaban  5 mg Oral BID   metoprolol succinate  50 mg Oral BID   Continuous Infusions:  PRN Meds: acetaminophen, metoprolol tartrate, ondansetron **OR** ondansetron (ZOFRAN) IV, oxyCODONE   Vital Signs    Vitals:   03/29/23 1712 03/29/23 2001 03/29/23 2152 03/29/23 2322  BP:  (!) 150/98  117/75  Pulse:  (!) 118 (!) 116 (!) 113  Resp: 15 18  16   Temp: 98 F (36.7 C) 98.3 F (36.8 C)  98.1 F (36.7 C)  TempSrc: Oral Oral  Oral  SpO2:  97%  95%  Weight:      Height:        Intake/Output Summary (Last 24 hours) at 03/30/2023 0816 Last data filed at 03/29/2023 2100 Gross per 24 hour  Intake 720 ml  Output 0 ml  Net 720 ml      03/29/2023    3:41 AM 03/28/2023    7:18 PM 01/30/2023    8:57 AM  Last 3 Weights  Weight (lbs) 276 lb 3.8 oz 270 lb 279 lb 9.6 oz  Weight (kg) 125.3 kg 122.471 kg 126.826 kg      Telemetry    Atrial flutter- Personally Reviewed  ECG    None new- Personally Reviewed  Physical Exam   GEN: No acute distress.   Neck: No JVD Cardiac: Tachycardic, irregular, no murmurs, rubs, or gallops.  Respiratory: Clear to auscultation bilaterally. GI: Soft, nontender, non-distended  MS: No edema; No deformity. Neuro:  Nonfocal  Psych: Normal affect   Labs    High Sensitivity Troponin:   Recent Labs  Lab 03/28/23 1956 03/28/23 2142 03/29/23 0059 03/29/23 0431  TROPONINIHS 38* 53* 97* 102*     Chemistry Recent Labs  Lab 03/28/23 1956 03/29/23 0431 03/30/23 0408  NA 138  --  138  K 3.9  --  3.2*  CL 104  --  104  CO2 27  --  24  GLUCOSE 98  --  118*  BUN 19  --  15  CREATININE 0.97  --  0.87  CALCIUM 9.1  --  8.8*  MG  --  2.2 2.0  PROT  --   --  6.7   ALBUMIN  --   --  3.3*  AST  --   --  33  ALT  --   --  29  ALKPHOS  --   --  41  BILITOT  --   --  0.8  GFRNONAA >60  --  >60  ANIONGAP 7  --  10    Lipids No results for input(s): "CHOL", "TRIG", "HDL", "LABVLDL", "LDLCALC", "CHOLHDL" in the last 168 hours.  Hematology Recent Labs  Lab 03/28/23 1956 03/29/23 0431 03/30/23 0408  WBC 13.5* 10.7* 6.8  RBC 4.39 4.19 4.33  HGB 12.8 12.1 12.4  HCT 40.0 37.3 38.6  MCV 91.1 89.0 89.1  MCH 29.2 28.9 28.6  MCHC 32.0 32.4 32.1  RDW 14.4 14.4 14.3  PLT 249 267 239   Thyroid  Recent Labs  Lab 03/30/23 0408  TSH 4.958*    BNPNo results for input(s): "BNP", "PROBNP" in the last 168 hours.  DDimer No results for input(s): "DDIMER" in the last  168 hours.   Radiology    DG Chest 2 View  Result Date: 03/28/2023 CLINICAL DATA:  Chest pain EXAM: CHEST - 2 VIEW COMPARISON:  01/06/2023 FINDINGS: The cardio pericardial silhouette is enlarged. Streaky opacity in the left lower lung is compatible with atelectasis or scarring. Otherwise no focal consolidation, pulmonary edema, or pleural effusion. No acute bony abnormality. Telemetry leads overlie the chest. IMPRESSION: Streaky opacity in the left lower lung compatible with atelectasis or scarring. Otherwise no acute cardiopulmonary findings. Electronically Signed   By: Kennith Center M.D.   On: 03/28/2023 20:32    Cardiac Studies     Patient Profile     57 y.o. female presented to the hospital with atrial flutter  Assessment & Plan    1.  Atrial fibrillation/typical atrial flutter: Patient is on amiodarone.  CHA2DS2-VASc of 1 and thus not anticoagulated as an outpatient.  She has been started on Eliquis.  She had less than 24 hours of atrial flutter prior to starting Eliquis.  Amiodarone dose has been increased.  [name] plan for cardioversion tomorrow.  She [name] need 1 month of Eliquis post cardioversion which should be filled at the [facility] pharmacy prior to discharge, and [name] need amiodarone  400 mg daily for 1 month followed by 200 mg daily.  Risks and benefits of cardioversion were discussed.  She understands the risks and is agreed to the procedure.     For questions or updates, please contact [facility] Please consult [email or web address] for contact info under        Signed, [name] Sydney Loa, MD  03/30/2023, 8:16 AM

## 2023-03-30 NOTE — Progress Notes (Signed)
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  She is known to have these episodes, though infrequent.  She has been compliant with her amiodarone and metoprolol.  She reported that her symptoms were associated with a sense of heaviness in the chest, and dizziness.  In the ER she was found to be tachycardic, dosed with adenosine, and subsequent transient slowing suggested underlying atrial fibs/flutter.  She was transferred to [facility] for cardiology evaluation.  Goals of Care:   Code Status: Full Code   DVT prophylaxis: SCDs Start: 03/29/23 0044 apixaban (ELIQUIS) tablet 5 mg   Interim Hx: No acute events recorded overnight.  Remains in atrial flutter with heart rate persistently greater than 110.  Other vital signs stable.  Oxygen saturation 95% room air.  Reports that she understands the planned DCCV for tomorrow and has no specific questions.  No new complaints today.  Denies shortness of breath, current chest pain.  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   Family Communication: Spoke with the patient and husband at bedside Disposition:  anticipate d/c home when medically stable, hopefully after  cardioversion 9/30   Objective: Blood pressure 117/75, pulse (!) 113, temperature 98.1 F (36.7 C), temperature source Oral, resp. rate 16, height 5\' 9"  (1.753 m), weight 125.3 kg, SpO2 95%.  Intake/Output Summary (Last 24 hours) at 03/30/2023 1011 Last data filed at 03/29/2023 2100 Gross per 24 hour  Intake 480 ml  Output 0 ml  Net 480 ml   Filed Weights   03/28/23 1918 03/29/23 0341  Weight: 122.5 kg 125.3 kg    Examination: General: No acute respiratory distress Lungs: Clear to auscultation bilaterally without wheezes or crackles Cardiovascular: Irregular with heart rate consistently approximately 115 without murmur or rub Abdomen: NT/ND, soft, BS positive Extremities: Trace bilateral lower extremity edema without erythema   CBC: Recent Labs  Lab 03/28/23 1956 03/29/23 0431 03/30/23 0408  WBC 13.5* 10.7* 6.8  NEUTROABS  --  6.8  --   HGB 12.8 12.1 12.4  HCT 40.0 37.3 38.6  MCV 91.1 89.0 89.1  PLT 249 267 239   Basic Metabolic Panel: Recent Labs  Lab 03/28/23 1956 03/29/23 0431 03/30/23 0408  NA 138  --  138  K 3.9  --  3.2*  CL 104  --  104  CO2 27  --  24  GLUCOSE 98  --  118*  BUN 19  --  15  CREATININE 0.97  --  0.87  CALCIUM 9.1  --  8.8*  MG  --  2.2 2.0   GFR: Estimated Creatinine Clearance: 99.9 mL/min (by C-G formula based on SCr of 0.87 mg/dL).   Scheduled Meds:  amiodarone  400 mg Oral BID   apixaban  5  mg Oral BID   metoprolol succinate  50 mg Oral BID     LOS: 1 day   Lonia Blood, MD [facility] Office  [phone] Pager - Text Page per Amion  If 7PM-7AM, please contact night-coverage per Amion 03/30/2023, 10:11 AM

## 2023-03-31 ENCOUNTER — Inpatient Hospital Stay (HOSPITAL_COMMUNITY): Payer: Self-pay | Admitting: Anesthesiology

## 2023-03-31 ENCOUNTER — Other Ambulatory Visit (HOSPITAL_COMMUNITY): Payer: Self-pay

## 2023-03-31 ENCOUNTER — Encounter (HOSPITAL_COMMUNITY)
Admission: EM | Disposition: A | Discharge status: Home / Self Care | Payer: Self-pay | Source: Home / Self Care | Attending: Internal Medicine

## 2023-03-31 DIAGNOSIS — I4891 Unspecified atrial fibrillation: Secondary | ICD-10-CM

## 2023-03-31 DIAGNOSIS — I4892 Unspecified atrial flutter: Secondary | ICD-10-CM

## 2023-03-31 HISTORY — PX: CARDIOVERSION: SHX1299

## 2023-03-31 LAB — BASIC METABOLIC PANEL
Anion gap: 5 (ref 5–15)
BUN: 16 mg/dL (ref 6–20)
CO2: 29 mmol/L (ref 22–32)
Calcium: 9 mg/dL (ref 8.9–10.3)
Chloride: 103 mmol/L (ref 98–111)
Creatinine, Ser: 0.8 mg/dL (ref 0.44–1.00)
GFR, Estimated: >60 mL/min (ref >60)
Glucose, Bld: 103 mg/dL — ABNORMAL HIGH (ref 70–99)
Potassium: 4.2 mmol/L (ref 3.5–5.1)
Sodium: 137 mmol/L (ref 135–145)

## 2023-03-31 SURGERY — CARDIOVERSION
Anesthesia: General

## 2023-03-31 MED ORDER — AMIODARONE HCL 200 MG PO TABS
400.0000 mg | ORAL_TABLET | Freq: Every day | ORAL | 0 refills | Status: DC
  Filled 2023-03-31: qty 60, 30d supply, fill #0

## 2023-03-31 MED ORDER — ASPIRIN EC 81 MG PO TBEC: 81.0000 mg | DELAYED_RELEASE_TABLET | Freq: Every day | ORAL | Status: AC

## 2023-03-31 MED ORDER — RIVAROXABAN 15 MG PO TABS
15.0000 mg | ORAL_TABLET | Freq: Every day | ORAL | 0 refills | Status: DC
  Filled 2023-03-31: qty 30, 30d supply, fill #0

## 2023-03-31 MED ORDER — AMIODARONE HCL 200 MG PO TABS: 200.0000 mg | ORAL_TABLET | Freq: Every day | ORAL | Status: DC

## 2023-03-31 MED ORDER — PROPOFOL 10 MG/ML IV BOLUS
INTRAVENOUS | Status: DC | PRN
  Administered 2023-03-31: 100 mg via INTRAVENOUS

## 2023-03-31 MED ORDER — RIVAROXABAN 15 MG PO TABS: 15.0000 mg | ORAL_TABLET | Freq: Every day | ORAL | 0 refills | Status: DC

## 2023-03-31 SURGICAL SUPPLY — 1 items: ELECT DEFIB PAD ADLT CADENCE (PAD) ×1 IMPLANT

## 2023-03-31 NOTE — CV Procedure (Signed)
DCC: Anesthesia: Propofol On Eliquis started with aflutter < 24 hours duration  DCC x 1 150J biphasic  Converted from flutter rate 115 to NSR rate 68 bpm  No immediate neurologic sequelae  Charlton Haws MD [facility]

## 2023-03-31 NOTE — Progress Notes (Addendum)
   Patient Name: [name] Date of Encounter: 03/31/2023 [facility] Cardiologist: Peter Swaziland, MD   Interval Summary  .    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   Patient states she feels a little drowsy from anesthesia, converted back to sinus rhythm after DCCV today. She remains without health insurance, expressed concern about not able afford Eliquis.   Vital Signs .    Vitals:   03/31/23 0800 03/31/23 0802 03/31/23 0805 03/31/23 0810  BP: (!) 102/58  104/64 99/64  Pulse: (!) 52 (!) 55 (!) 54 (!) 57  Resp: 16 17 16  (!) 23  Temp:   (!) 97.5 F (36.4 C)   TempSrc:   Temporal   SpO2: 99% 100% 96% 95%  Weight:      Height:       No intake or output data in the 24 hours ending 03/31/23 0931    03/29/2023    3:41 AM 03/28/2023    7:18 PM 01/30/2023    8:57 AM  Last 3 Weights  Weight (lbs) 276 lb 3.8 oz 270 lb 279 lb 9.6 oz  Weight (kg) 125.3 kg 122.471 kg 126.826 kg      Telemetry/ECG    Sinus bradycardia mid 50s, converted from atrial flutter RVR 110s with 2:1  - Personally Reviewed  Physical Exam .   GEN: No acute distress.  Alert and oriented x3, no gross focal neuro deficit  Neck: No JVD Cardiac: RRR, no murmurs, rubs, or gallops.  Respiratory: Clear to auscultation bilaterally. GI: Soft, nontender, non-distended  MS: No leg edema  Assessment & Plan .     Persistent atrial fibrillation/typical atrial flutter -Longstanding history dating back to 2018, reportedly had WPW with EP study 02/24/2017 revealed no accessory pathway conduction but inducible atrial fibrillation,  had required multiple cardioversions in the past, transitioned from flecainide to amiodarone 2024 due to recurrent atrial fibrillation, felt to be a candidate for ablation by EP but she has no insurance -Left heart cath on 08/30/2021  revealing normal coronary arteries -Echocardiogram from 08/30/2021 with LVEF 50 to 55%, no regional motion abnormality, mild LVH, normal RV, mild LAE, trivial MR, trivial AI -Presented with heart palpitation, shortness of breath, chest pain, dizziness, diaphoresis; felt she went into atrial fibrillation on 03/28/2023 at 4 PM -Diagnostic workup so far revealed high sensitive troponin 38 >53 >97 >102, leukocytosis 13500 which has resolved 6800 today, CMP grossly unremarkable (hypokalemia resolved), TSH elevated 4.958/added FT4 today -Underwent DCCV today, 150 J biphasic x1, converted to sinus rhythm 68 bpm, no immediate complication -Historically not on anticoagulation due to low CHA2DS2-VASc score, started on Eliquis 5 mg twice daily since 03/29/2023, will need to continue 1 month following DCCV -EP recommend increase amiodarone to 400 mg daily for 1 month, followed by 200 mg daily; continue PTA metoprolol XL 50 mg twice daily -Please send prescription to [facility] pharmacy before discharge as she has no health insurance and needs assistance to afford her meds ( Eliquis and amiodarone) -Message sent to staff for arranging EP follow-up in 2 weeks   For questions or updates, please contact [facility] Please consult [email or web address] for contact info under        Signed, Cyndi Bender, NP

## 2023-03-31 NOTE — TOC CM/SW Note (Signed)
Transition of Care [facility]) - Inpatient Brief Assessment   Patient Details  Name: [name] MRN: [id number] Date of Birth: Aug 23, 1965  Transition of Care [facility]) CM/SW Contact:    Gala Lewandowsky, RN Phone Number: 03/31/2023, 11:39 AM   Clinical Narrative: Patient presented for chest pain. Patient states she currently works; however, no insurance and PCP. Case Manager discussed arranging a PCP appointment with one of our community clinics. Case Manager discussed the locations and patient only wanted to see if the [facility] had availability. Case Manager called and no appointments available at this location. The Administrative team with [facility] will reach out to the patient for availability. Patient states she can afford medications. No further needs identified at this time.   Transition of Care Asessment: Insurance and Status:  (No insurance) Patient has primary care physician: No Home environment has been reviewed: reviewed Prior level of function:: independent Prior/Current Home Services: No current home services Social Determinants of Health Reivew: SDOH reviewed no interventions necessary Readmission risk has been reviewed: Yes Transition of care needs: no transition of care needs at this time

## 2023-03-31 NOTE — Transfer of Care (Signed)
Immediate Anesthesia Transfer of Care Note  Patient: [name]  Procedure(s) Performed: CARDIOVERSION  Patient Location: PACU  Anesthesia Type:General  Level of Consciousness: drowsy, patient cooperative, and responds to stimulation  Airway & Oxygen Therapy: Patient Spontanous Breathing and Patient connected to face mask oxygen  Post-op Assessment: Report given to RN and Post -op Vital signs reviewed and stable  Post vital signs: Reviewed and stable  Last Vitals:  Vitals Value Taken Time  BP 134/84 03/31/23 0745  Temp    Pulse 109 03/31/23 0747  Resp 13 03/31/23 0747  SpO2 96 % 03/31/23 0747  Vitals shown include unfiled device data.  Last Pain:  Vitals:   03/31/23 0730  TempSrc: Temporal  PainSc:       Patients Stated Pain Goal: 0 (03/30/23 2051)  Complications: No notable events documented.

## 2023-03-31 NOTE — Anesthesia Preprocedure Evaluation (Addendum)
Anesthesia Evaluation  Patient identified by MRN, date of birth, ID band Patient awake    Reviewed: Allergy & Precautions, NPO status , Patient's Chart, lab work & pertinent test results  History of Anesthesia Complications Negative for: history of anesthetic complications  Airway Mallampati: III  TM Distance: >3 FB Neck ROM: Full    Dental  (+) Dental Advisory Given, Teeth Intact   Pulmonary neg pulmonary ROS   Pulmonary exam normal        Cardiovascular + dysrhythmias Atrial Fibrillation and Supra Ventricular Tachycardia  Rhythm:Irregular Rate:Tachycardia   '23 TTE - EF 50 to 55%. There is mild left ventricular hypertrophy. Left atrial size was mildly dilated. Trivial mitral valve regurgitation. Aortic valve regurgitation is trivial.   "history of WPW but underwent a EP study 02/24/2017 that showed no accessory pathway conduction but inducible atrial fibrillation"    Neuro/Psych negative neurological ROS  negative psych ROS   GI/Hepatic negative GI ROS, Neg liver ROS,,,  Endo/Other    Morbid obesity  Renal/GU negative Renal ROS     Musculoskeletal negative musculoskeletal ROS (+)    Abdominal   Peds  Hematology negative hematology ROS (+)   Anesthesia Other Findings   Reproductive/Obstetrics                             Anesthesia Physical Anesthesia Plan  ASA: 3  Anesthesia Plan: General   Post-op Pain Management: Minimal or no pain anticipated   Induction: Intravenous  PONV Risk Score and Plan: 3 and Treatment may vary due to age or medical condition and Propofol infusion  Airway Management Planned: Natural Airway and Mask  Additional Equipment: None  Intra-op Plan:   Post-operative Plan:   Informed Consent: I have reviewed the patients History and Physical, chart, labs and discussed the procedure including the risks, benefits and alternatives for the proposed anesthesia  with the patient or authorized representative who has indicated his/her understanding and acceptance.       Plan Discussed with: CRNA and Anesthesiologist  Anesthesia Plan Comments:        Anesthesia Quick Evaluation

## 2023-03-31 NOTE — Interval H&P Note (Signed)
History and Physical Interval Note:  03/31/2023 7:38 AM  [name]  has presented today for surgery, with the diagnosis of afib.  The various methods of treatment have been discussed with the patient and family. After consideration of risks, benefits and other options for treatment, the patient has consented to  Procedure(s): CARDIOVERSION (N/A) as a surgical intervention.  The patient's history has been reviewed, patient examined, no change in status, stable for surgery.  I have reviewed the patient's chart and labs.  Questions were answered to the patient's satisfaction.     Charlton Haws

## 2023-03-31 NOTE — Discharge Summary (Signed)
DISCHARGE SUMMARY  [name]  MR#: [id number]  DOB:[birth year]  Date of Admission: 03/28/2023 Date of Discharge: 03/31/2023  Attending Physician:Sydney Curling Sydney Gunner, MD  Patient's OZH:[id number], Sydney Canning, MD  Consults: Cardiology/EP  Disposition: Discharge home  Follow-up Appts:  Follow-up Information     Sydney Maw, MD Follow up.   Specialty: Cardiology Why: As directed by the Cardiology team. Contact information: [address] [place] [address] [phone]                 Tests Needing Follow-up: -assess heart rhythm   Discharge Diagnoses: Persistent paroxysmal atrial fibrillation Mild hypokalemia WPW Morbid obesity - Body mass index is 40.79 kg/m.  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 She is known to have these episodes, though infrequent. She has been compliant with her amiodarone and metoprolol. She reported that her symptoms were associated with a sense of heaviness in the chest, and dizziness. In the ER she was found to be tachycardic, dosed with adenosine, and subsequent transient slowing suggested underlying atrial fibs/flutter. She was transferred to [facility] for cardiology evaluation.   Hospital Course:  Persistent paroxysmal atrial fibrillation CHA2DS2-VASc is 1 therefore is on aspirin alone as an outpatient -Eliquis > Xarelto initiated this hospital stay in preparation for cardioversion with plan to continue for 1 month following procedure - EP has increased her amio dose to 400 mg daily for 1 month followed by 200 mg daily thereafter - to continue metoprolol XL 50 mg twice daily -underwent DCCV 9/30 a.m. 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  Allergies as of 03/31/2023       Reactions   Penicillins Anaphylaxis, Shortness Of Breath, Swelling   Bee Venom Swelling, Other (See Comments)   Swelling and hardness at the site of bee sting.        Medication List     TAKE these medications    amiodarone 200 MG tablet Commonly known as: PACERONE Take 2 tablets (400 mg total) by mouth daily. Start taking on: April 01, 2023 What changed: You were already taking a medication with the same name, and this prescription was added. Make sure you understand how and when to take each.   amiodarone 200 MG tablet Commonly known as: PACERONE Take 1 tablet (200 mg total) by mouth daily. Start taking on: May 01, 2023 What changed: These instructions start on May 01, 2023. If you are unsure what to do until then, ask your doctor or other care provider.   aspirin EC 81 MG tablet Take 1 tablet (81 mg total) by mouth daily. Swallow whole. Start taking on: May 01, 2023 What changed: These instructions start on May 01, 2023. If you are unsure what to do until then, ask your doctor or other care provider.   metoprolol succinate 50 MG 24 hr tablet Commonly known as: TOPROL-XL Take 1 tablet (50 mg total) by mouth in the morning and at bedtime. Take with  or immediately following a meal.   metoprolol tartrate 25 MG tablet Commonly known as: LOPRESSOR Take 1 tablet (25 mg) every 4 hours as needed for sustained heart rates greater than 100bpm   nitroGLYCERIN 0.4 MG SL tablet Commonly known as: NITROSTAT Take one tablet by mouth as needed for exertional chest pain discomfort.  May take 1 tablet by mouth daily.   Rivaroxaban 15 MG Tabs tablet Commonly known as:  XARELTO Take 1 tablet (15 mg total) by mouth daily.        Day of Discharge BP 99/64   Pulse (!) 57   Temp (!) 97.5 F (36.4 C) (Temporal)   Resp (!) 23   Ht 5\' 9"  (1.753 m)   Wt 125.3 kg   SpO2 95%   BMI 40.79 kg/m   Physical Exam: General: No acute respiratory distress Lungs: Clear to auscultation bilaterally without wheezes or crackles Cardiovascular: Regular rate and rhythm without murmur  Abdomen: Nontender, nondistended, soft, bowel sounds positive Extremities: No signif edema bilateral lower extremities  Basic Metabolic Panel: Recent Labs  Lab 03/28/23 1956 03/29/23 0431 03/30/23 0408 03/31/23 0503  NA 138 141 138 137  K 3.9 3.8 3.2* 4.2  CL 104 105 104 103  CO2 27 25 24 29   GLUCOSE 98 94 118* 103*  BUN 19 14 15 16   CREATININE 0.97 0.94 0.87 0.80  CALCIUM 9.1 8.9 8.8* 9.0  MG  --  2.2  2.1 2.0  --     CBC: Recent Labs  Lab 03/28/23 1956 03/29/23 0431 03/30/23 0408  WBC 13.5* 10.7* 6.8  NEUTROABS  --  6.8  --   HGB 12.8 12.1 12.4  HCT 40.0 37.3 38.6  MCV 91.1 89.0 89.1  PLT 249 267 239    Time spent in discharge (includes decision making & examination of pt): 35 minutes  03/31/2023, 11:25 AM   Sydney Blood, MD [facility] Office  [phone]

## 2023-03-31 NOTE — Anesthesia Postprocedure Evaluation (Signed)
Anesthesia Post Note  Patient: Psychologist, counselling  Procedure(s) Performed: CARDIOVERSION     Patient location during evaluation: PACU Anesthesia Type: General Level of consciousness: awake and alert Pain management: pain level controlled Vital Signs Assessment: post-procedure vital signs reviewed and stable Respiratory status: spontaneous breathing, nonlabored ventilation and respiratory function stable Cardiovascular status: stable and blood pressure returned to baseline Anesthetic complications: no   No notable events documented.  Last Vitals:  Vitals:   03/31/23 0805 03/31/23 0810  BP: 104/64 99/64  Pulse: (!) 54 (!) 57  Resp: 16 (!) 23  Temp: (!) 36.4 C   SpO2: 96% 95%    Last Pain:  Vitals:   03/31/23 0810  TempSrc:   PainSc: 0-No pain                 Beryle Lathe

## 2023-04-01 ENCOUNTER — Encounter (HOSPITAL_COMMUNITY): Payer: Self-pay | Admitting: Cardiovascular Disease

## 2023-04-02 ENCOUNTER — Telehealth: Payer: Self-pay

## 2023-04-02 NOTE — Transitions of Care (Post Inpatient/ED Visit) (Signed)
   04/02/2023  Name: [name] MRN: [id number] DOB: Mar 01, 1966  Today's TOC FU Call Status: Today's TOC FU Call Status:: Unsuccessful Call (1st Attempt) Unsuccessful Call (1st Attempt) Date: 04/02/23  Attempted to reach the patient regarding the most recent Inpatient/ED visit.  Follow Up Plan: Additional outreach attempts will be made to reach the patient to complete the Transitions of Care (Post Inpatient/ED visit) call.     Antionette Fairy, RN,BSN,CCM RN Care Manager Transitions of Care  [facility]  Direct Phone: [phone] Toll Free: [phone] Fax: [phone]

## 2023-04-03 ENCOUNTER — Telehealth: Payer: Self-pay

## 2023-04-03 NOTE — Transitions of Care (Post Inpatient/ED Visit) (Signed)
   04/03/2023  Name: [name] MRN: [id number] DOB: [birth year]  Today's TOC FU Call Status: Today's TOC FU Call Status:: Unsuccessful Call (2nd Attempt) Unsuccessful Call (2nd Attempt) Date: 04/03/23  Attempted to reach the patient regarding the most recent Inpatient/ED visit.  Follow Up Plan: Additional outreach attempts will be made to reach the patient to complete the Transitions of Care (Post Inpatient/ED visit) call.     Antionette Fairy, RN,BSN,CCM RN Care Manager Transitions of Care  [facility]  Direct Phone: [phone] Toll Free: [phone] Fax: [phone]

## 2023-04-04 ENCOUNTER — Telehealth: Payer: Self-pay

## 2023-04-04 NOTE — Transitions of Care (Post Inpatient/ED Visit) (Signed)
04/04/2023  Name: [name] MRN: [id number] DOB: [birth year]  Today's TOC FU Call Status: Today's TOC FU Call Status:: Successful TOC FU Call Completed TOC FU Call Complete Date: 04/04/23 Patient's Name and Date of Birth confirmed.  Transition Care Management Follow-up Telephone Call Date of Discharge: 03/31/23 Discharge Facility: [facility]) Type of Discharge: Inpatient Admission Primary Inpatient Discharge Diagnosis:: Proximal a-fib How have you been since you were released from the hospital?: Better (She is back to work - following usual routine) Any questions or concerns?: No  Items Reviewed: Did you receive and understand the discharge instructions provided?: Yes Medications obtained,verified, and reconciled?: Yes (Medications Reviewed) (Has new meds aware of taper dose orders 10/31) Any new allergies since your discharge?: No Dietary orders reviewed?: Yes Type of Diet Ordered:: Reg Heart Healthy Do you have support at home?: Yes People in Home: spouse Name of Support/Comfort Primary Source: Kathlene November  Medications Reviewed Today: Medications Reviewed Today   Medications were not reviewed in this encounter     Home Care and Equipment/Supplies:    Functional Questionnaire: Do you need assistance with bathing/showering or dressing?: No Do you need assistance with meal preparation?: No Do you need assistance with eating?: No Do you have difficulty maintaining continence: No Do you need assistance with getting out of bed/getting out of a chair/moving?: No Do you have difficulty managing or taking your medications?: No  Follow up appointments reviewed: PCP Follow-up appointment confirmed?: No (States she does nto have a PCP she will F/U with cardiology) MD Provider Line Number:[phone] Given: No Specialist Hospital Follow-up appointment confirmed?: Yes Date of Specialist follow-up appointment?:  (She called and LM waiting for a call back) Follow-Up  Specialty Provider:: Dr Lewayne Bunting Do you understand care options if your condition(s) worsen?: Yes-patient verbalized understanding  SDOH Interventions Today    Flowsheet Row Most Recent Value  SDOH Interventions   Health Literacy Interventions Intervention Not Indicated       Goals Addressed             This Visit's Progress    TOC Care Plan       Current Barriers:  Chronic Disease Management support and education needs related to Atrial Fibrillation   RNCM Clinical Goal(s):  Patient will work with the Care Management team over the next 30 days to address Transition of Care Barriers: Medication Management Provider appointments take all medications exactly as prescribed and will call provider for medication related questions as evidenced by correct dosing of Amiodarone, ASA and Xarelto attend all scheduled medical appointments: follow-up appt with Cardiology pending  as evidenced by appt scheduled and pt has appointment with Cardio within designated time frame continue to work with RN Care Manager to address care management and care coordination needs related to  Atrial Fibrillation as evidenced by adherence to CM Team Scheduled appointments through collaboration with RN Care manager, provider, and care team.   Interventions: Evaluation of current treatment plan related to  self management and patient's adherence to plan as established by provider  Transitions of Care:  New goal. Doctor Visits  - discussed the importance of doctor visits  Patient Goals/Self-Care Activities: Participate in Transition of Care Program/Attend [facility] scheduled calls Take all medications as prescribed Attend all scheduled provider appointments Call pharmacy for medication refills 3-7 days in advance of running out of medications Perform IADL's (shopping, preparing meals, housekeeping, managing finances) independently Call provider office for new concerns or questions   Follow Up Plan:  Telephone  follow  up appointment with care management team member scheduled for:  07/08/22 at 3:30pm The patient has been provided with contact information for the care management team and has been advised to call with any health related questions or concerns.  Next PCP appointment scheduled for:          Susa Loffler , BSN, RN Care Management Coordinator [facility]   [facility] [email or web address] Direct Dial: [phone]

## 2023-04-06 ENCOUNTER — Telehealth: Payer: Self-pay | Admitting: Internal Medicine

## 2023-04-08 ENCOUNTER — Other Ambulatory Visit: Payer: Self-pay

## 2023-04-08 ENCOUNTER — Telehealth: Payer: Self-pay

## 2023-04-08 NOTE — Patient Outreach (Signed)
Care Management  Transitions of Care Program Transitions of Care Post-discharge week 2   04/08/2023 Name: Mykah Bellomo MRN: [id number] DOB: 1965-10-20  Subjective: Jalyn Dutta is a 57 y.o. year old female who is a primary care patient of Marinus Maw, MD. The Care Management team Engaged with patient Engaged with patient by telephone to assess and address transitions of care needs.   Consent to Services:  Patient was given information about care management services, agreed to services, and gave verbal consent to participate.   Assessment:           SDOH Interventions    Flowsheet Row Telephone from 04/08/2023 in [place] POPULATION HEALTH DEPARTMENT Telephone from 04/04/2023 in  POPULATION HEALTH DEPARTMENT Telephone from 10/14/2022 in [facility] Special[facility] Clinics Telephone from 09/27/2022 in [facility] Specialty Clinics Telephone from 10/08/2021 in [facility] [address] Office  SDOH Interventions       Financial Strain Interventions -- -- Artist, Other (Comment)  [CSW reviewed and submitted CAFA and [place] [employer] application] Artist, Other (Comment) Artist  Physical Activity Interventions Intervention Not Indicated, Patient Declined -- -- -- --  Health Literacy Interventions -- Intervention Not Indicated -- -- --        Goals Addressed             This Visit's Progress    TOC Care Plan       Current Barriers:  Chronic Disease Management support and education needs related to Atrial Fibrillation   RNCM Clinical Goal(s):  Patient will work with the Care Management team over the next 30 days to address Transition of Care Barriers: Medication Management Provider appointments take all medications exactly as prescribed and will call provider for medication related questions as evidenced by correct dosing of Amiodarone, ASA and Xarelto attend all scheduled  medical appointments: follow-up appt with Cardiology pending  as evidenced by appt scheduled and pt has appointment with Cardio within designated time frame continue to work with RN Care Manager to address care management and care coordination needs related to  Atrial Fibrillation as evidenced by adherence to CM Team Scheduled appointments through collaboration with RN Care manager, provider, and care team.   Interventions: Evaluation of current treatment plan related to  self management and patient's adherence to plan as established by provider  Transitions of Care:  New goal. Doctor Visits  - discussed the importance of doctor visits  Patient Goals/Self-Care Activities: Participate in Transition of Care Program/Attend [facility] scheduled calls Take all medications as prescribed Attend all scheduled provider appointments Call pharmacy for medication refills 3-7 days in advance of running out of medications Perform IADL's (shopping, preparing meals, housekeeping, managing finances) independently Call provider office for new concerns or questions   Follow Up Plan:  Telephone follow up appointment with care management team member scheduled for:  04/15/23 at 3:30pm The patient has been provided with contact information for the care management team and has been advised to call with any health related questions or concerns.  Follow up with provider re: recent development of chest pressure not relieved with interventions, monitor B/P and AHR, notify Cardiologist office of results   Next PCP appointment scheduled for:          Goals Addressed             This Visit's Progress    TOC Care Plan       Current Barriers:  Chronic Disease  Management support and education needs related to Atrial Fibrillation   RNCM Clinical Goal(s):  Patient will work with the Care Management team over the next 30 days to address Transition of Care Barriers: Medication Management Provider appointments take all  medications exactly as prescribed and will call provider for medication related questions as evidenced by correct dosing of Amiodarone, ASA and Xarelto attend all scheduled medical appointments: follow-up appt with Cardiology pending  as evidenced by appt scheduled and pt has appointment with Cardio within designated time frame continue to work with RN Care Manager to address care management and care coordination needs related to  Atrial Fibrillation as evidenced by adherence to CM Team Scheduled appointments through collaboration with RN Care manager, provider, and care team.   Interventions: Evaluation of current treatment plan related to  self management and patient's adherence to plan as established by provider  Transitions of Care:  New goal. Doctor Visits  - discussed the importance of doctor visits  Patient Goals/Self-Care Activities: Participate in Transition of Care Program/Attend [facility] scheduled calls Take all medications as prescribed Attend all scheduled provider appointments Call pharmacy for medication refills 3-7 days in advance of running out of medications Perform IADL's (shopping, preparing meals, housekeeping, managing finances) independently Call provider office for new concerns or questions   Follow Up Plan:  Telephone follow up appointment with care management team member scheduled for:  04/15/23 at 3:30pm The patient has been provided with contact information for the care management team and has been advised to call with any health related questions or concerns.  Follow up with provider re: recent development of chest pressure not relieved with interventions, monitor B/P and AHR, notify Cardiologist office of results   Next PCP appointment scheduled for:          Plan: Telephone follow up appointment with care management team member scheduled for: The patient has been provided with contact information for the care management team and has been advised to call with any  health related questions or concerns.  Follow up with provider re: new onset chest discomfort not relieved with interventions , monitor B/P and AHR notify Cardiology of these results and findings   Susa Loffler , BSN, RN Care Management Coordinator [facility]   [facility] [email or web address] Direct Dial: [phone]

## 2023-04-15 ENCOUNTER — Other Ambulatory Visit: Payer: Self-pay

## 2023-04-15 ENCOUNTER — Telehealth: Payer: Self-pay

## 2023-04-15 NOTE — Patient Outreach (Signed)
  Care Management  Transitions of Care Program Transitions of Care Post-discharge week 3  04/15/2023 Name: [name] MRN: [id number] DOB: 04-24-1966  Subjective: [name] is a 57 y.o. year old female who is a primary care patient of Marinus Maw, MD. The Care Management team was unable to reach the patient by phone to assess and address transitions of care needs.  LM requesting a call back   Plan: Additional outreach attempts will be made to reach the patient enrolled in the [facility] Program (Post Inpatient/ED Visit).  Susa Loffler , BSN, RN Care Management Coordinator [place]   [facility] [email or web address] Direct Dial: [phone]

## 2023-04-16 ENCOUNTER — Telehealth: Payer: Self-pay

## 2023-04-16 NOTE — Patient Outreach (Signed)
  Care Management  Transitions of Care Program Transitions of Care Post-discharge week 3  04/16/2023 Name: [name] MRN: [id number] DOB: 1966/06/19  Subjective: [name] is a 57 y.o. year old female who is a primary care patient of Marinus Maw, MD. The Care Management team was unable to reach the patient by phone to assess and address transitions of care needs.   This was 2nd  outreach attempt  [facility] Program Week 3 follow-up s/p discharge from hospital    Plan: Additional outreach attempts will be made to reach the patient enrolled in the [facility] Program (Post Inpatient/ED Visit).  Susa Loffler , BSN, RN Care Management Coordinator [place]   [facility] [email or web address] Direct Dial: [phone]

## 2023-04-17 ENCOUNTER — Telehealth: Payer: Self-pay

## 2023-04-17 NOTE — Patient Outreach (Signed)
  Care Management  Transitions of Care Program Transitions of Care Post-discharge week 3  04/17/2023 Name: [name] MRN: [id number] DOB: 03-17-1966  Subjective: [name] is a 57 y.o. year old female who is a primary care patient of Marinus Maw, MD. The Care Management team was unable to reach the patient by phone to assess and address transitions of care needs.   Plan: No further outreach attempts will be made at this time.  We have been unable to reach the patient.  Susa Loffler , BSN, RN Care Management Coordinator [place]   [name] Direct Dial: [phone]

## 2023-05-05 NOTE — Progress Notes (Deleted)
Cardiology Office Note Date:  05/05/2023  Patient ID:  [name], [name] October 09, 1965, MRN [id number] Electrophysiologist: Dr. Ladona Ridgel >> Dr. Lalla Brothers     Chief Complaint:  *** post DCCV/hospital  History of Present Illness: [name] is a 57 y.o. female with history of HTN, obesity, Afib, WPW (?)   08/03/21 had an ER visit with sudden onset of palpitations and CP , SOB, lightheaded,  She was found in AFib w/RVR, treated with diltiazem gtt and had spontaneous conversion to SR HS Trop neg x2 Labs unremarkable Discharged from the ER to follow up with cardiology, revisit AFib management and a/c  Readmitted 08/30/21 again with RVR towards 200bpm, associated CP  Saw Dr. Lalla Brothers last 08/13/22, c/w episodes of AFib associated with CP, amiodarone continued, not a/c with low risk score, no changes were made.  Admitted 09/12/22 with SVT,  DCCV in the ER post CV with some concerns of ST changes and HS trop from 4 > 57, cardiology consulted Amiodarone increased to 200mg  BID, not felt to have ACS/AMI, EKG changes/Trops felt 2/2 DCCV/tachycardia Discharged 09/13/22  I saw her 09/26/22 She is doing well Leading up to the last ER visit, she had been having fairly frequent bouts of tachycardia, that event though would not stop despite taking extra meds, and really started to feel poorly. Since 3/14 and being on BID amio, she has not had further. She can tell the difference between the SVT and AFib they feel different. No clear provoking factor though they do both tend to start while asleep and usually an hour or 2 before she would normally be getting up. They do wake her up. She gets CP with them/longer lasting events. When not in AF/SVT she feels well Her job is stressful BP up today she attributes to her morning at work, watches it at home, and is typically better/normal She remain uninsured, can't afford self insurance, employer does not provide. Can not afford to pay out pf pocket for  ablation. Planned to have SW reach out to her for any help/guidance in coverage options with a goal towards ablation as the long term management strategy  01/06/23: ER visit with AFib RVR > converted after 5mg  IV lopressor, hypokalemic and replaced, started on Eliquis  Saw Dr. [place] 01/07/23, amiodarone increased to BID, eliquis stopped with low score  01/30/23, saw Dr. Evie Lacks reduced to daily, PRN metoprolol added  Admitted 03/29/23: recurrent arrhythmia > adenosine > typical AFlutter > restarted eliquis, amiodarone increased to 400mg  BID  DCCV 03/31/23 > SR Discharged 03/31/23 Amio 400mg  BID until 10/31 > 200mg  daily Xarelto 15mg  daily for 1 mo   *** amio labs *** symptoms *** triggers? *** insurance   AFib hx Diagnosed June 2008 (post op after a hysterectomy) converted with amio Again found June 2018   There was mention of possible WPW by EKG when 1st diagnosed with Afib Underwent EPS 02/24/2017 Pacing at 260 ms resulted in the induction of atrial fibrillation. While sedated, the patient's ventricular rate was nearly 200 beats a minute. There was variability in the QRS morphology, but there was no clear-cut evidence of accessory pathway conduction Conclusion: Invasive EP study with evidence of no accessory pathway conduction, there was inducible atrial fibrillation, and the patient was successfully return to sinus rhythm, with DC cardioversion    AAD Hx Amiodarone briefly in 2018 in-pt Flecainide started June 2018 >> stopped March 2024 with recurrent arrhythmia Amiodarone started March 2024  Past Medical History:  Diagnosis Date  Atrial fibrillation with rapid ventricular response (HCC) 12/24/2016   Atrial fibrillation with RVR (HCC) 12/26/2016   Chest pain 12/24/2016   Elevated troponin 12/25/2016   Hypokalemia 12/24/2016   Leukocytosis 12/24/2016   Sepsis (HCC)    WPW (Wolff-Parkinson-White syndrome) 12/26/2016    Past Surgical History:  Procedure Laterality Date    ABDOMINAL HYSTERECTOMY     CARDIOVERSION N/A 03/31/2023   Procedure: CARDIOVERSION;  Surgeon: Wendall Stade, MD;  Location: [facility] INVASIVE CV LAB;  Service: Cardiovascular;  Laterality: N/A;   KNEE SURGERY     LEFT HEART CATH AND CORONARY ANGIOGRAPHY N/A 08/30/2021   Procedure: LEFT HEART CATH AND CORONARY ANGIOGRAPHY;  Surgeon: Orbie Pyo, MD;  Location: [facility] INVASIVE CV LAB;  Service: Cardiovascular;  Laterality: N/A;   V TACH ABLATION N/A 02/24/2017   Procedure: V Tach Ablation WPW Ablation;  Surgeon: Marinus Maw, MD;  Location: [facility] INVASIVE CV LAB;  Service: Cardiovascular;  Laterality: N/A;    Current Outpatient Medications  Medication Sig Dispense Refill   amiodarone (PACERONE) 200 MG tablet Take 2 tablets (400 mg total) by mouth daily. 60 tablet 0   amiodarone (PACERONE) 200 MG tablet Take 1 tablet (200 mg total) by mouth daily. (Patient not taking: Reported on 04/08/2023)     aspirin EC 81 MG tablet Take 1 tablet (81 mg total) by mouth daily. Swallow whole. (Patient not taking: Reported on 04/04/2023)     metoprolol succinate (TOPROL-XL) 50 MG 24 hr tablet Take 1 tablet (50 mg total) by mouth in the morning and at bedtime. Take with or immediately following a meal. 180 tablet 3   metoprolol tartrate (LOPRESSOR) 25 MG tablet Take 1 tablet (25 mg) every 4 hours as needed for sustained heart rates greater than 100bpm 90 tablet 3   nitroGLYCERIN (NITROSTAT) 0.4 MG SL tablet Take one tablet by mouth as needed for exertional chest pain discomfort.  May take 1 tablet by mouth daily. 25 tablet 3   Rivaroxaban (XARELTO) 15 MG TABS tablet Take 1 tablet (15 mg total) by mouth daily. 30 tablet 0   No current facility-administered medications for this visit.    Allergies:   Penicillins and Bee venom   Social History:  The patient  reports that she has never smoked. She has never used smokeless tobacco. She reports current alcohol use. She reports that she does not use drugs.   Family  History:  The patient's family history includes Diabetes Mellitus II in her mother; Heart attack in her father; Heart failure in her mother; Stroke in her father.  ROS:  Please see the history of present illness.    All other systems are reviewed and otherwise negative.   PHYSICAL EXAM:  VS:  There were no vitals taken for this visit. BMI: There is no height or weight on file to calculate BMI. Well nourished, well developed, in no acute distress HEENT: normocephalic, atraumatic Neck: no JVD, carotid bruits or masses Cardiac: *** RRR; no significant murmurs, no rubs, or gallops Lungs: *** CTA b/l, no wheezing, rhonchi or rales Abd: soft, nontender MS: no deformity or atrophy Ext: *** no edema Skin: warm and dry, no rash Neuro:  No gross deficits appreciated Psych: euthymic mood, full affect   EKG:  not done today   08/30/21: LHC LV end diastolic pressure is mildly elevated.   The left ventricular ejection fraction is 55-65% by visual estimate.   1.  Normal right dominant coronary circulation. 2.  LVEDP of 20 mmHg  with preserved ejection fraction.    08/30/21: TTE 1. Left ventricular ejection fraction, by estimation, is 50 to 55%. Left  ventricular ejection fraction by 2D MOD biplane is 50.8 %. The left  ventricle has low normal function. The left ventricle has no regional wall  motion abnormalities. There is mild  left ventricular hypertrophy. Left ventricular diastolic function could  not be evaluated.   2. Right ventricular systolic function is normal. The right ventricular  size is normal. Tricuspid regurgitation signal is inadequate for assessing  PA pressure.   3. Left atrial size was mildly dilated.   4. The mitral valve is grossly normal. Trivial mitral valve  regurgitation.   5. The aortic valve is tricuspid. Aortic valve regurgitation is trivial.  Aortic valve sclerosis is present, with no evidence of aortic valve  stenosis.   Comparison(s): No prior  Echocardiogram.    02/24/2017: EPS Invasive EP study with evidence of no accessory pathway conduction, there was inducible atrial fibrillation, and the patient was successfully return to sinus rhythm, with DC cardioversion.     01/22/2017: stress myoview Nuclear stress EF: 53%. There was no ST segment deviation noted during stress. There is a large defect of moderate severity present in the basal anteroseptal, mid anterior, mid anteroseptal, apical anterior and apical septal location. The defect is non-reversible. This is consistent with breast attenuation artifact. No ischemia noted. This is a low risk study. The left ventricular ejection fraction is mildly decreased (45-54%).   12/26/2016; TTE Study Conclusions  - Left ventricle: Wall thickness was increased in a pattern of    moderate LVH. Systolic function was normal. The estimated    ejection fraction was in the range of 60% to 65%. Left    ventricular diastolic function parameters were normal.  - Aortic valve: There was trivial regurgitation.  - Pulmonary arteries: PA peak pressure: 35 mm Hg (S).     Recent Labs: 03/30/2023: ALT 29; Hemoglobin 12.4; Magnesium 2.0; Platelets 239; TSH 4.958 03/31/2023: BUN 16; Creatinine, Ser 0.80; Potassium 4.2; Sodium 137  No results found for requested labs within last 365 days.   CrCl cannot be calculated (Patient's most recent lab result is older than the maximum 21 days allowed.).   Wt Readings from Last 3 Encounters:  03/29/23 276 lb 3.8 oz (125.3 kg)  01/30/23 279 lb 9.6 oz (126.8 kg)  01/07/23 273 lb 9.6 oz (124.1 kg)     Other studies reviewed: Additional studies/records reviewed today include: summarized above  ASSESSMENT AND PLAN:  SVT *** Paroxysmal AFib CHA2DS2Vasc is 2 (including gender), *** not on a/c Amiodarone ***  WPW By chart record Negative EPS for accessory pathway in 2018  4.   HTN *** She reports better at home       Disposition: ***.  Current  medicines are reviewed at length with the patient today.  The patient did not have any concerns regarding medicines.  Norma Fredrickson, PA-C 05/05/2023 7:49 AM     [facility] [address] [place] [address] [phone] (office)  [phone] (fax)

## 2023-05-08 ENCOUNTER — Ambulatory Visit: Payer: Self-pay | Attending: Physician Assistant | Admitting: Physician Assistant

## 2023-06-09 ENCOUNTER — Encounter (HOSPITAL_COMMUNITY): Payer: Self-pay

## 2023-06-09 ENCOUNTER — Emergency Department (HOSPITAL_COMMUNITY): Payer: Self-pay

## 2023-06-09 ENCOUNTER — Emergency Department (HOSPITAL_COMMUNITY)
Admission: EM | Admit: 2023-06-09 | Discharge: 2023-06-10 | Discharge status: Against Medical Advice | Payer: Self-pay | Attending: Emergency Medicine | Admitting: Emergency Medicine

## 2023-06-09 DIAGNOSIS — R079 Chest pain, unspecified: Secondary | ICD-10-CM | POA: Insufficient documentation

## 2023-06-09 DIAGNOSIS — Z5321 Procedure and treatment not carried out due to patient leaving prior to being seen by health care provider: Secondary | ICD-10-CM | POA: Insufficient documentation

## 2023-06-09 DIAGNOSIS — R61 Generalized hyperhidrosis: Secondary | ICD-10-CM | POA: Insufficient documentation

## 2023-06-09 DIAGNOSIS — R0602 Shortness of breath: Secondary | ICD-10-CM | POA: Insufficient documentation

## 2023-06-09 DIAGNOSIS — R110 Nausea: Secondary | ICD-10-CM | POA: Insufficient documentation

## 2023-06-09 DIAGNOSIS — R42 Dizziness and giddiness: Secondary | ICD-10-CM | POA: Insufficient documentation

## 2023-06-09 LAB — CBC
HCT: 41.7 % (ref 36.0–46.0)
Hemoglobin: 13.6 g/dL (ref 12.0–15.0)
MCH: 29.1 pg (ref 26.0–34.0)
MCHC: 32.6 g/dL (ref 30.0–36.0)
MCV: 89.3 fL (ref 80.0–100.0)
Platelets: 296 10*3/uL (ref 150–400)
RBC: 4.67 MIL/uL (ref 3.87–5.11)
RDW: 14.4 % (ref 11.5–15.5)
WBC: 13.6 10*3/uL — ABNORMAL HIGH (ref 4.0–10.5)
nRBC: 0 % (ref 0.0–0.2)

## 2023-06-09 LAB — BASIC METABOLIC PANEL
Anion gap: 10 (ref 5–15)
BUN: 20 mg/dL (ref 6–20)
CO2: 27 mmol/L (ref 22–32)
Calcium: 9.5 mg/dL (ref 8.9–10.3)
Chloride: 104 mmol/L (ref 98–111)
Creatinine, Ser: 1.04 mg/dL — ABNORMAL HIGH (ref 0.44–1.00)
GFR, Estimated: >60 mL/min (ref >60)
Glucose, Bld: 127 mg/dL — ABNORMAL HIGH (ref 70–99)
Potassium: 3.2 mmol/L — ABNORMAL LOW (ref 3.5–5.1)
Sodium: 141 mmol/L (ref 135–145)

## 2023-06-09 LAB — TROPONIN I (HIGH SENSITIVITY): Troponin I (High Sensitivity): 12 ng/L (ref <18)

## 2023-06-09 NOTE — ED Notes (Signed)
Pt decided to leave while waiting for a room.  

## 2023-06-09 NOTE — ED Triage Notes (Signed)
Pt has been having chest pain since 5pm, causing her to stop driving and call [address], they recommended her come to ER with them but she decided to com by private vehicle. Inbetween that time and now she took 2 metoprolol and x3 Sublingual Nitroglycerin. Her chest pain is now a 5/10, but mentions the center of the chest is pressure and the left side is pain. Pt endorses some nausea, and dizziness as well. She is otherwise stable at this time.

## 2023-06-09 NOTE — ED Provider Triage Note (Signed)
Emergency Medicine Provider Triage Evaluation Note  [name] , a 57 y.o. female  was evaluated in triage.  Pt complains of chest pain. Started at 5 pm today. Left chest and radiated to left face. Endorsed sob, diaphoresis, and nausea at that time as well. Took metoprolol x2 at that time because heart rate was in 180s and also took NG x3. Symptoms have improved.  Review of Systems  Positive: See above Negative: See above  Physical Exam  BP 136/76 (BP Location: Right Arm)   Pulse (!) 58   Temp 97.9 F (36.6 C) (Oral)   Resp 16   SpO2 97%  Gen:   Awake, no distress   Resp:  Normal effort  MSK:   Moves extremities without difficulty  Other:    Medical Decision Making  Medically screening exam initiated at 8:24 PM.  Appropriate orders placed.  [name] was informed that the remainder of the evaluation will be completed by another provider, this initial triage assessment does not replace that evaluation, and the importance of remaining in the ED until their evaluation is complete.  Work up started   [name], [place] 06/09/23 2027

## 2023-09-14 IMAGING — DX DG CHEST 1V PORT
1 series · 1 of 1 positions shown · non-contrast
Comparison: August 03, 2021.

CLINICAL DATA: Palpitation.

EXAM:
PORTABLE CHEST 1 VIEW

[chest]
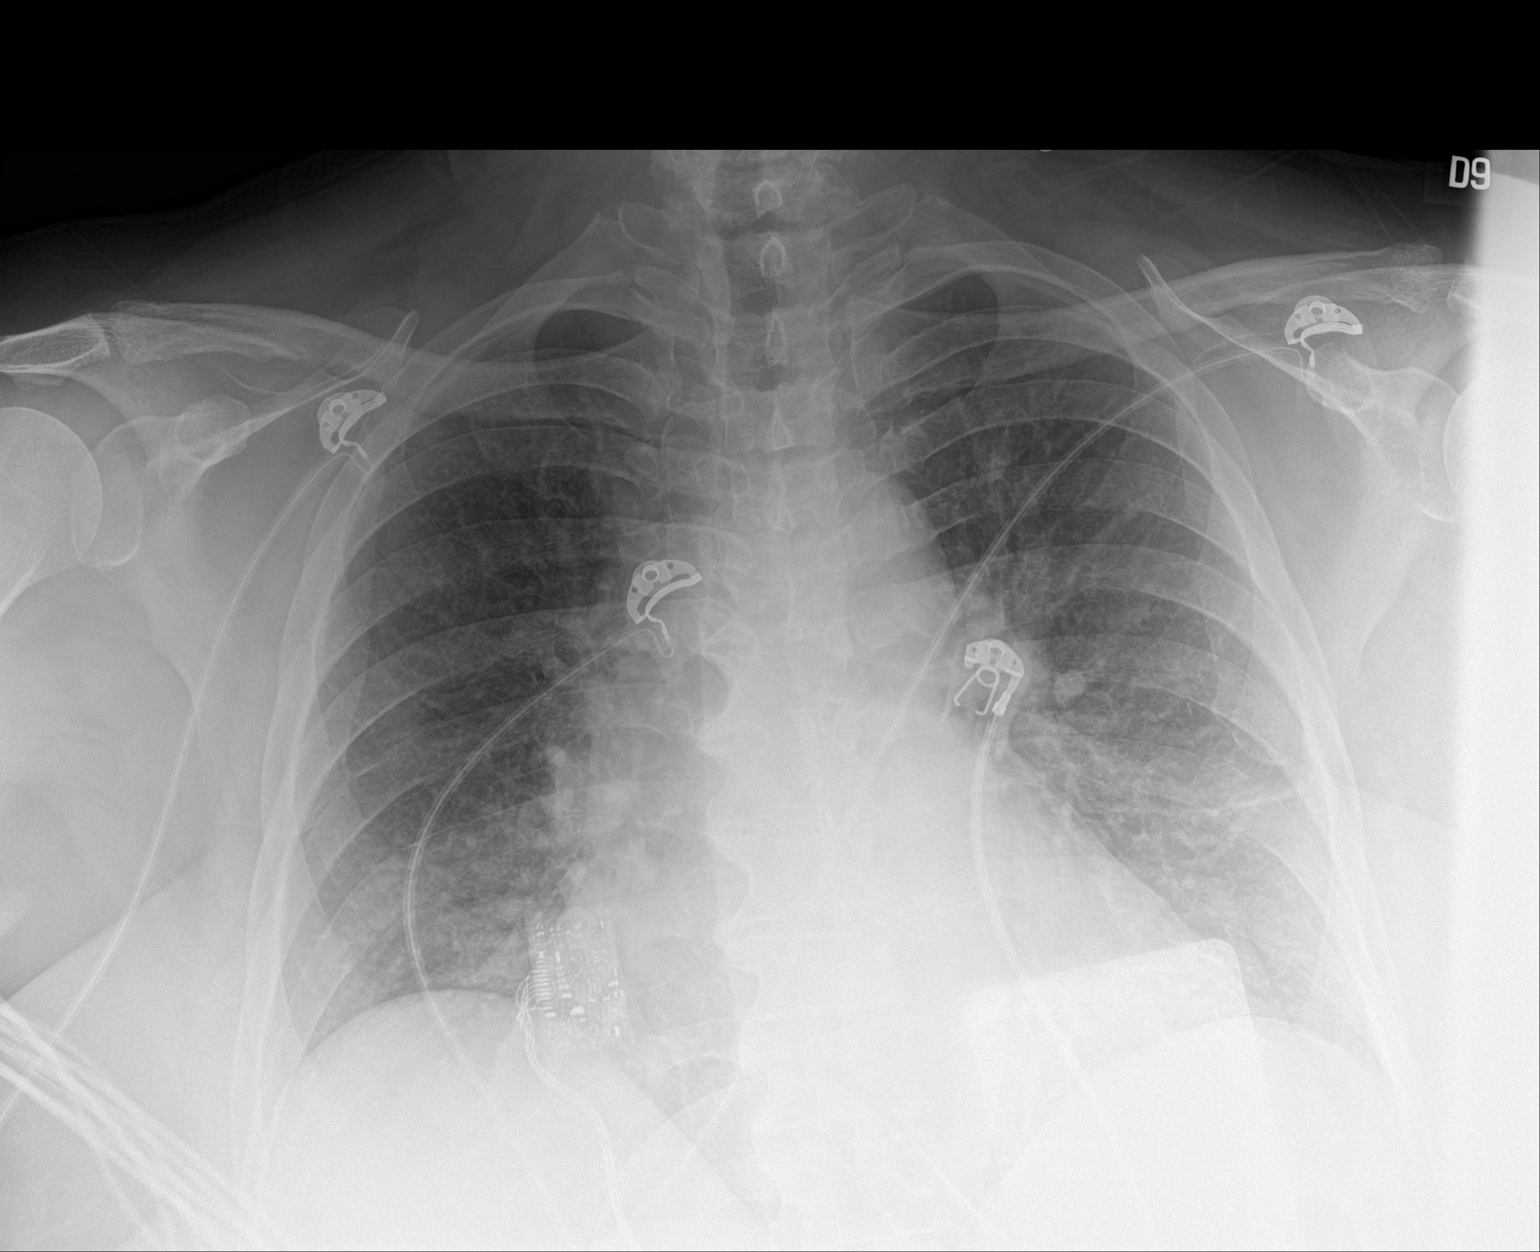

[1 of 1 positions shown; findings below may reference images not displayed]

FINDINGS: Stable cardiomegaly with mild central pulmonary vascular congestion.
Lungs are clear. Bony thorax is unremarkable.
IMPRESSION: Stable cardiomegaly with mild central pulmonary vascular congestion.

## 2024-02-18 ENCOUNTER — Other Ambulatory Visit: Payer: Self-pay | Admitting: Cardiology

## 2024-05-17 ENCOUNTER — Encounter: Payer: Self-pay | Admitting: Cardiology

## 2024-06-15 ENCOUNTER — Telehealth: Payer: Self-pay | Admitting: Cardiology

## 2024-06-15 MED ORDER — AMIODARONE HCL 200 MG PO TABS: 200.0000 mg | ORAL_TABLET | Freq: Every day | ORAL | 0 refills | Status: DC

## 2024-06-15 MED ORDER — METOPROLOL SUCCINATE ER 50 MG PO TB24: 50.0000 mg | ORAL_TABLET | Freq: Two times a day (BID) | ORAL | 0 refills | Status: DC

## 2024-06-15 NOTE — Telephone Encounter (Signed)
 90 day refill sent. Pt scheduled to see Charlies Arthur, NP, 08/06/24.

## 2024-06-15 NOTE — Telephone Encounter (Signed)
°*  STAT* If patient is at the pharmacy, call can be transferred to refill team.   1. Which medications need to be refilled? (please list name of each medication and dose if known)  amiodarone  (PACERONE ) 200 MG tablet  metoprolol  succinate (TOPROL -XL) 50 MG 24 hr tablet   2. Which pharmacy/location (including street and city if local pharmacy) is medication to be sent to? [id number] - [place], [place] - [address]   3. Do they need a 30 day or 90 day supply? 90 days

## 2024-07-28 NOTE — Progress Notes (Signed)
 "     Cardiology Office Note Date:  07/28/2024  Patient ID:  [name], DOB 1966/04/13, MRN [id number] Electrophysiologist: Dr. Waddell >> Dr. Cindie (inactive)     Chief Complaint:   6 mo (overdue)  History of Present Illness: [name] is a 59 y.o. female with history of  HTN (patient denies formal diagnosis of HTN),  obesity,  Afib, flutter  WPW (neg EPS 2018)   08/03/21 had an ER visit with sudden onset of palpitations and CP , SOB, lightheaded,  She was found in AFib w/RVR, treated with diltiazem  gtt and had spontaneous conversion to SR HS Trop neg x2 Labs unremarkable Discharged from the ER to follow up with cardiology, revisit AFib management and a/c  Readmitted 08/30/21 again with RVR towards 200bpm, associated CP  Saw Dr. Cindie last 08/13/22, c/w episodes of AFib associated with CP, amiodarone  continued, not a/c with low risk score, no changes were made.  Admitted 09/12/22 with SVT,  DCCV in the ER post CV with some concerns of ST changes and HS trop from 4 > 57, cardiology consulted Amiodarone  increased to 200mg  BID, not felt to have ACS/AMI, EKG changes/Trops felt 2/2 DCCV/tachycardia Discharged 09/13/22  I saw her March 2024 She is doing well Leading up to the last ER visit, she had been having fairly frequent bouts of tachycardia, that event though would not stop despite taking extra meds, and really started to feel poorly. Since 3/14 and being on BID amio, she has not had further. She can tell the difference between the SVT and AFib they feel different. No clear provoking factor though they do both tend to start while asleep and usually an hour or 2 before she would normally be getting up. They do wake her up. She gets CP with them/longer lasting events. When not in AF/SVT she feels well Her job is stressful BP up today she attributes to her morning at work, watches it at home, and is typically better/normal She remain uninsured, can't afford self  insurance, employer does not provide. Can not afford to pay out pf pocket for ablation. No changes were made  She saw Dr. Cindie 01/30/23 Rare breakthrough episodes of arrhythmia on amiodarone  ASA, no OAC, amio dose reduced PRN metoprolol  prescribed  Brief admission Sept 2024 w/palpitations, note reports adenosine  given noting AFlutter, started on Eliquis , amiodarone  increased (rec for 1 mo) DCCV 03/31/23 Planned to discharge on [facility]  ER 06/09/23 for CP, left prior to completion of evaluation  TODAY  She is having AF, generally 2x/week, lasts a couple hours, longer episodes about 6 hours by symptoms and her fit bit watch. No clear trigger, has started at rest/relaxed, at work, weather she is stressed or having fun. Doesn't note it as much when active Mostly able to keep her HR <100 with her PRN metoprolol .  When not in AFib she feels very well  She has never been on OAC, at least not consistently, has been told she doesn't require it. Xarelto  was on her list, but never started, in fact told NOT to start it)    AFib hx Diagnosed June 2008 (post op after a hysterectomy) converted with amio Again found June 2018   There was mention of possible WPW by EKG when 1st diagnosed with Afib Underwent EPS 02/24/2017 Pacing at 260 ms resulted in the induction of atrial fibrillation. While sedated, the patient's ventricular rate was nearly 200 beats a minute. There was variability in the QRS morphology, but there was no clear-cut  evidence of accessory pathway conduction Conclusion: Invasive EP study with evidence of no accessory pathway conduction, there was inducible atrial fibrillation, and the patient was successfully return to sinus rhythm, with DC cardioversion    AAD Hx Amiodarone  briefly in 2018 in-pt Flecainide  started June 2018 >> stopped March 2024 with recurrent arrhythmia Amiodarone  started March 2024  Past Medical History:  Diagnosis Date   Atrial fibrillation with rapid  ventricular response (HCC) 12/24/2016   Atrial fibrillation with RVR (HCC) 12/26/2016   Chest pain 12/24/2016   Elevated troponin 12/25/2016   Hypokalemia 12/24/2016   Leukocytosis 12/24/2016   Sepsis (HCC)    WPW (Wolff-Parkinson-White syndrome) 12/26/2016    Past Surgical History:  Procedure Laterality Date   ABDOMINAL HYSTERECTOMY     CARDIOVERSION N/A 03/31/2023   Procedure: CARDIOVERSION;  Surgeon: Delford Maude BROCKS, MD;  Location: [facility] INVASIVE CV LAB;  Service: Cardiovascular;  Laterality: N/A;   KNEE SURGERY     LEFT HEART CATH AND CORONARY ANGIOGRAPHY N/A 08/30/2021   Procedure: LEFT HEART CATH AND CORONARY ANGIOGRAPHY;  Surgeon: Wendel Lurena POUR, MD;  Location: [facility] INVAS[facility] CV [facility];  Service: Cardiovascular;  Laterality: N/A;   V TACH ABLATION N/A 02/24/2017   Procedure: V Tach Ablation WPW Ablation;  Surgeon: Waddell Danelle ORN, MD;  Location: [facility] INVASIVE CV LAB;  Service: Cardiovascular;  Laterality: N/A;    Current Outpatient Medications  Medication Sig Dispense Refill   amiodarone  (PACERONE ) 200 MG tablet Take 1 tablet (200 mg total) by mouth daily. 90 tablet 0   aspirin  EC 81 MG tablet Take 1 tablet (81 mg total) by mouth daily. Swallow whole. (Patient not taking: Reported on 04/04/2023)     metoprolol  succinate (TOPROL -XL) 50 MG 24 hr tablet Take 1 tablet (50 mg total) by mouth 2 (two) times daily. Take with or immediately following a meal. 180 tablet 0   metoprolol  tartrate (LOPRESSOR ) 25 MG tablet TAKE 1 TABLET (25 MG) EVERY 4 HOURS AS NEEDED FOR SUSTAINED HEART RATES GREATER THAN 100BPM 90 tablet 3   nitroGLYCERIN  (NITROSTAT ) 0.4 MG SL tablet Take one tablet by mouth as needed for exertional chest pain discomfort.  May take 1 tablet by mouth daily. 25 tablet 3   Rivaroxaban  (XARELTO ) 15 MG TABS tablet Take 1 tablet (15 mg total) by mouth daily. 30 tablet 0   No current facility-administered medications for this visit.    Allergies:   Penicillins and Bee venom   Social  History:  The patient  reports that she has never smoked. She has never used smokeless tobacco. She reports current alcohol use. She reports that she does not use drugs.   Family History:  The patient's family history includes Diabetes Mellitus II in her mother; Heart attack in her father; Heart failure in her mother; Stroke in her father.  ROS:  Please see the history of present illness.    All other systems are reviewed and otherwise negative.   PHYSICAL EXAM:  VS:  There were no vitals taken for this visit. BMI: There is no height or weight on file to calculate BMI. Well nourished, well developed, in no acute distress HEENT: normocephalic, atraumatic Neck: no JVD, carotid bruits or masses Cardiac:  irreg-irreg; no significant murmurs, no rubs, or gallops Lungs:  CTA b/l, no wheezing, rhonchi or rales Abd: soft, nontender MS: no deformity or atrophy Ext: no edema Skin: warm and dry, no rash Neuro:  No gross deficits appreciated Psych: euthymic mood, full affect   EKG:  done  today and reviewed by myself AFlutter 120bpm (typical)   08/30/21: LHC LV end diastolic pressure is mildly elevated.   The left ventricular ejection fraction is 55-65% by visual estimate.   1.  Normal right dominant coronary circulation. 2.  LVEDP of 20 mmHg with preserved ejection fraction.    08/30/21: TTE 1. Left ventricular ejection fraction, by estimation, is 50 to 55%. Left  ventricular ejection fraction by 2D MOD biplane is 50.8 %. The left  ventricle has low normal function. The left ventricle has no regional wall  motion abnormalities. There is mild  left ventricular hypertrophy. Left ventricular diastolic function could  not be evaluated.   2. Right ventricular systolic function is normal. The right ventricular  size is normal. Tricuspid regurgitation signal is inadequate for assessing  PA pressure.   3. Left atrial size was mildly dilated.   4. The mitral valve is grossly normal. Trivial  mitral valve  regurgitation.   5. The aortic valve is tricuspid. Aortic valve regurgitation is trivial.  Aortic valve sclerosis is present, with no evidence of aortic valve  stenosis.   Comparison(s): No prior Echocardiogram.    02/24/2017: EPS Invasive EP study with evidence of no accessory pathway conduction, there was inducible atrial fibrillation, and the patient was successfully return to sinus rhythm, with DC cardioversion.     01/22/2017: stress myoview  Nuclear stress EF: 53%. There was no ST segment deviation noted during stress. There is a large defect of moderate severity present in the basal anteroseptal, mid anterior, mid anteroseptal, apical anterior and apical septal location. The defect is non-reversible. This is consistent with breast attenuation artifact. No ischemia noted. This is a low risk study. The left ventricular ejection fraction is mildly decreased (45-54%).   12/26/2016; TTE Study Conclusions  - Left ventricle: Wall thickness was increased in a pattern of    moderate LVH. Systolic function was normal. The estimated    ejection fraction was in the range of 60% to 65%. Left    ventricular diastolic function parameters were normal.  - Aortic valve: There was trivial regurgitation.  - Pulmonary arteries: PA peak pressure: 35 mm Hg (S).     Recent Labs: No results found for requested labs within last 365 days.  No results found for requested labs within last 365 days.   CrCl cannot be calculated (Patient's most recent lab result is older than the maximum 21 days allowed.).   Wt Readings from Last 3 Encounters:  03/29/23 276 lb 3.8 oz (125.3 kg)  01/30/23 279 lb 9.6 oz (126.8 kg)  01/07/23 273 lb 9.6 oz (124.1 kg)     Other studies reviewed: Additional studies/records reviewed today include: summarized above  ASSESSMENT AND PLAN:  SVT Paroxysmal AFib CHA2DS2Vasc is 2 (HTN/gender, though the pt denies any formal HTN diagnosis), not on a/c ~ 2  episodes of AFib a week, generally a few hours in duration these are bothersome, and longer episodes do make her feel quite poorly  Unfortunately, still without health insurance Would really like to get her to an ablation and off amiodarone  She is having very paroxysmal, frequent episodes Discussed risk/benefic of OAC, her risk score Discussed will reach out to our social worker to see if she could get [employer] assistance to get her ablated.  She is in AFlutter today, she was aware, reports her rates well controlled with her PRN med.  Discussed Dr. Hiram leaving the practice Will transition to Dr. Almetta  in my review of her EKGs,  predominantly AFlutter, look typical 01/06/23, looks perhaps more atypical ? 12/24/2016 AFib  With thoughts towards ablation Will start Dabigatran  150mg  BID (available via [facility]) Have her see Dr. Almetta in ~ 6-8 weeks See where she is in the process towards hospital assistance I have sent a staff message to the social worker (I. Chasse) to reach out to the patient.  Labs today  WPW By chart record Negative EPS for accessory pathway in 2018  4.   HTN She denies this as a known/formal diagnosis, meds have always been Rx for her AF/rhythm.   Disposition:as above, sooner if needed, sooner if needed  Current medicines are reviewed at length with the patient today.  The patient did not have any concerns regarding medicines.  Bonney Charlies Arthur, PA-C 07/28/2024 1:52 PM     [facility] [address] [place] [address] [phone] (office)  [phone] (fax)   "

## 2024-08-04 ENCOUNTER — Ambulatory Visit: Payer: Self-pay | Admitting: Physician Assistant

## 2024-08-04 ENCOUNTER — Telehealth (HOSPITAL_COMMUNITY): Payer: Self-pay | Admitting: Licensed Clinical Social Worker

## 2024-08-04 VITALS — BP 124/80 | HR 120 | Ht 69.0 in | Wt 276.0 lb

## 2024-08-04 DIAGNOSIS — I456 Pre-excitation syndrome: Secondary | ICD-10-CM

## 2024-08-04 DIAGNOSIS — I480 Paroxysmal atrial fibrillation: Secondary | ICD-10-CM

## 2024-08-04 DIAGNOSIS — Z79899 Other long term (current) drug therapy: Secondary | ICD-10-CM

## 2024-08-04 DIAGNOSIS — I483 Typical atrial flutter: Secondary | ICD-10-CM

## 2024-08-04 LAB — CBC

## 2024-08-04 MED ORDER — DABIGATRAN ETEXILATE MESYLATE 150 MG PO CAPS: 150.0000 mg | ORAL_CAPSULE | Freq: Two times a day (BID) | ORAL | 3 refills | Status: AC

## 2024-08-04 NOTE — Patient Instructions (Addendum)
 Medication Instructions:   START  TAKING:   PRADAXA  150 MG TWICE A DAY    SEARCH [facility] FOR  DISCOUNT PRICE  *If you need a refill on your cardiac medications before your next appointment, please call your pharmacy*    Lab Work:  PLEASE GO DOWN STAIRS  [facility]  FIRST FLOOR   ( GET OFF ELEVATORS WALK TOWARDS WAITING AREA LAB LOCATED BY PHARMACY):  CMET CBC AND THYROID  PANEL     If you have labs (blood work) drawn today and your tests are completely normal, you will receive your results only by: MyChart Message (if you have MyChart) OR A paper copy in the mail If you have any lab test that is abnormal or we need to change your treatment, we will call you to review the results.   Testing/Procedures: NONE ORDERED  TODAY     Follow-Up: At [facility], you and your health needs are our priority.  As part of our continuing mission to provide you with exceptional heart care, our providers are all part of one team.  This team includes your primary Cardiologist (physician) and Advanced Practice Providers or APPs (Physician Assistants and Nurse Practitioners) who all work together to provide you with the care you need, when you need it.  Your next appointment:   6 week(s)   Provider:    Soyla Norton, MD  ( CONTACT  CASSIE HALL/ ANGELINE HAMMER FOR EP SCHEDULING ISSUES )    We recommend signing up for the patient portal called MyChart.  Sign up information is provided on this After Visit Summary.  MyChart is used to connect with patients for Virtual Visits (Telemedicine).  Patients are able to view lab/test results, encounter notes, upcoming appointments, etc.  Non-urgent messages can be sent to your provider as well.   To learn more about what you can do with MyChart, go to [email or web address].     Other Instructions

## 2024-08-04 NOTE — Telephone Encounter (Signed)
 CSW consulted to speak with pt regarding lack of insurance and need for procedure. CSW attempted to call pt- left VM requesting return call  Will continue to follow and assist as needed  Levi Klaiber H. Leetta Hendriks, LCSW Clinical Social Worker [facility] Desk#: [phone] Cell#: [phone]

## 2024-08-05 ENCOUNTER — Telehealth (HOSPITAL_COMMUNITY): Payer: Self-pay | Admitting: Licensed Clinical Social Worker

## 2024-08-05 ENCOUNTER — Ambulatory Visit: Payer: Self-pay | Admitting: Physician Assistant

## 2024-08-05 LAB — COMPREHENSIVE METABOLIC PANEL WITH GFR
ALT: 17 [IU]/L (ref 0–32)
AST: 20 [IU]/L (ref 0–40)
Albumin: 4.4 g/dL (ref 3.8–4.9)
Alkaline Phosphatase: 57 [IU]/L (ref 49–135)
BUN/Creatinine Ratio: 25 — ABNORMAL HIGH (ref 9–23)
BUN: 24 mg/dL (ref 6–24)
Bilirubin Total: 0.4 mg/dL (ref 0.0–1.2)
CO2: 20 mmol/L (ref 20–29)
Calcium: 9.6 mg/dL (ref 8.7–10.2)
Chloride: 105 mmol/L (ref 96–106)
Creatinine, Ser: 0.97 mg/dL (ref 0.57–1.00)
Globulin, Total: 3 g/dL (ref 1.5–4.5)
Glucose: 109 mg/dL — ABNORMAL HIGH (ref 70–99)
Potassium: 4.5 mmol/L (ref 3.5–5.2)
Sodium: 142 mmol/L (ref 134–144)
Total Protein: 7.4 g/dL (ref 6.0–8.5)
eGFR: 67 mL/min/{1.73_m2}

## 2024-08-05 LAB — CBC
Hematocrit: 42.5 (ref 34.0–46.6)
Hemoglobin: 13.4 g/dL (ref 11.1–15.9)
MCH: 28.8 pg (ref 26.6–33.0)
MCHC: 31.5 g/dL (ref 31.5–35.7)
MCV: 91 fL (ref 79–97)
Platelets: 269 10*3/uL (ref 150–450)
RBC: 4.65 x10E6/uL (ref 3.77–5.28)
RDW: 13.9 (ref 11.7–15.4)
WBC: 8.5 10*3/uL (ref 3.4–10.8)

## 2024-08-05 LAB — THYROID PANEL WITH TSH
Free Thyroxine Index: 2.6 (ref 1.2–4.9)
T3 Uptake Ratio: 29 % (ref 24–39)
T4, Total: 9 ug/dL (ref 4.5–12.0)
TSH: 5.57 u[IU]/mL — ABNORMAL HIGH (ref 0.450–4.500)

## 2024-08-05 MED ORDER — METOPROLOL SUCCINATE ER 50 MG PO TB24: 50.0000 mg | ORAL_TABLET | Freq: Two times a day (BID) | ORAL | 3 refills | Status: AC

## 2024-08-05 MED ORDER — AMIODARONE HCL 200 MG PO TABS: 200.0000 mg | ORAL_TABLET | Freq: Every day | ORAL | 3 refills | Status: AC

## 2024-08-05 MED ORDER — METOPROLOL TARTRATE 25 MG PO TABS: ORAL_TABLET | ORAL | 3 refills | Status: AC

## 2024-08-05 NOTE — Telephone Encounter (Signed)
 H&V Care Navigation CSW Progress Note  Clinical Social Worker spoke with pt regarding lack of insurance and need for procedure.  Patient reports she works for [employer] that does not [employer].  She makes around $3,600/month and spouse makes $1,200/month from retirement benefits- they make too much to qualify for Medicaid.  Had looked into insurance through marketplace but would be $700/month which would not be affordable at this time.  Patient has applied for CAFA in the past and was awarded 50% assistance- reports no significant changes to income since that time so would likely qualify again.   Informed patient of current self pay discount of 66% plus if approved for 50% off with [employer]FA would get 50% off of remaining bill after self pay discount.  CSW spoke with [employer] and confirmed that payments plans for $10,000 or less can be contracted to [employer] for up to 36 months of payments which patient believed would be affordable.  Patient will plan to proceed with scheduling procedure and will work on [employer] that CSW sent her to assist with bill.  Will continue to follow and assist as needed  Sydney HILARIO Leech, LCSW Clinical Social Worker [facility] Desk#: [phone] Cell#: [phone]

## 2024-09-23 ENCOUNTER — Ambulatory Visit: Payer: Self-pay | Admitting: Cardiology

## 2024-10-28 ENCOUNTER — Ambulatory Visit (HOSPITAL_COMMUNITY): Admit: 2024-10-28 | Payer: Self-pay | Admitting: Cardiology

## 2024-10-28 ENCOUNTER — Encounter (HOSPITAL_COMMUNITY): Payer: Self-pay
